# Patient Record
Sex: Female | Born: 1935 | Race: White | Hispanic: No | Marital: Single | Smoking: Never smoker
Health system: Southern US, Community
[De-identification: ages and names within clinical notes are randomized; demographics above are authoritative.]

## PROBLEM LIST (undated history)

## (undated) ENCOUNTER — Emergency Department (HOSPITAL_COMMUNITY): Payer: Commercial Managed Care - PPO

## (undated) DIAGNOSIS — K219 Gastro-esophageal reflux disease without esophagitis: Secondary | ICD-10-CM

## (undated) DIAGNOSIS — G450 Vertebro-basilar artery syndrome: Secondary | ICD-10-CM

## (undated) DIAGNOSIS — Z8739 Personal history of other diseases of the musculoskeletal system and connective tissue: Secondary | ICD-10-CM

## (undated) DIAGNOSIS — K5790 Diverticulosis of intestine, part unspecified, without perforation or abscess without bleeding: Secondary | ICD-10-CM

## (undated) DIAGNOSIS — N200 Calculus of kidney: Secondary | ICD-10-CM

## (undated) DIAGNOSIS — M1990 Unspecified osteoarthritis, unspecified site: Secondary | ICD-10-CM

## (undated) DIAGNOSIS — G4730 Sleep apnea, unspecified: Secondary | ICD-10-CM

## (undated) DIAGNOSIS — F40240 Claustrophobia: Secondary | ICD-10-CM

## (undated) DIAGNOSIS — N952 Postmenopausal atrophic vaginitis: Secondary | ICD-10-CM

## (undated) DIAGNOSIS — I10 Essential (primary) hypertension: Secondary | ICD-10-CM

## (undated) DIAGNOSIS — L719 Rosacea, unspecified: Secondary | ICD-10-CM

## (undated) DIAGNOSIS — E785 Hyperlipidemia, unspecified: Secondary | ICD-10-CM

## (undated) DIAGNOSIS — R7303 Prediabetes: Secondary | ICD-10-CM

## (undated) DIAGNOSIS — K449 Diaphragmatic hernia without obstruction or gangrene: Secondary | ICD-10-CM

## (undated) DIAGNOSIS — H8110 Benign paroxysmal vertigo, unspecified ear: Secondary | ICD-10-CM

## (undated) DIAGNOSIS — M5430 Sciatica, unspecified side: Secondary | ICD-10-CM

## (undated) DIAGNOSIS — H40009 Preglaucoma, unspecified, unspecified eye: Secondary | ICD-10-CM

## (undated) DIAGNOSIS — M4800 Spinal stenosis, site unspecified: Secondary | ICD-10-CM

## (undated) DIAGNOSIS — J45909 Unspecified asthma, uncomplicated: Secondary | ICD-10-CM

## (undated) DIAGNOSIS — G43909 Migraine, unspecified, not intractable, without status migrainosus: Secondary | ICD-10-CM

## (undated) DIAGNOSIS — M8580 Other specified disorders of bone density and structure, unspecified site: Secondary | ICD-10-CM

## (undated) DIAGNOSIS — I7300 Raynaud's syndrome without gangrene: Secondary | ICD-10-CM

## (undated) DIAGNOSIS — N816 Rectocele: Secondary | ICD-10-CM

## (undated) HISTORY — DX: Personal history of other diseases of the musculoskeletal system and connective tissue: Z87.39

## (undated) HISTORY — DX: Sciatica, unspecified side: M54.30

## (undated) HISTORY — DX: Claustrophobia: F40.240

## (undated) HISTORY — DX: Other specified disorders of bone density and structure, unspecified site: M85.80

## (undated) HISTORY — DX: Vertebro-basilar artery syndrome: G45.0

## (undated) HISTORY — DX: Raynaud's syndrome without gangrene: I73.00

## (undated) HISTORY — DX: Diaphragmatic hernia without obstruction or gangrene: K44.9

## (undated) HISTORY — DX: Rectocele: N81.6

## (undated) HISTORY — PX: BLADDER SUSPENSION: SHX72

## (undated) HISTORY — DX: Sleep apnea, unspecified: G47.30

## (undated) HISTORY — DX: Rosacea, unspecified: L71.9

## (undated) HISTORY — PX: OTHER SURGICAL HISTORY: SHX169

## (undated) HISTORY — DX: Gastro-esophageal reflux disease without esophagitis: K21.9

## (undated) HISTORY — PX: ABDOMINAL HYSTERECTOMY: SHX81

## (undated) HISTORY — DX: Benign paroxysmal vertigo, unspecified ear: H81.10

## (undated) HISTORY — DX: Diverticulosis of intestine, part unspecified, without perforation or abscess without bleeding: K57.90

## (undated) HISTORY — DX: Unspecified osteoarthritis, unspecified site: M19.90

## (undated) HISTORY — PX: HERNIA REPAIR: SHX51

## (undated) HISTORY — DX: Preglaucoma, unspecified, unspecified eye: H40.009

## (undated) HISTORY — PX: CARDIAC CATHETERIZATION: SHX172

## (undated) HISTORY — DX: Postmenopausal atrophic vaginitis: N95.2

## (undated) HISTORY — DX: Hyperlipidemia, unspecified: E78.5

## (undated) HISTORY — DX: Migraine, unspecified, not intractable, without status migrainosus: G43.909

## (undated) HISTORY — DX: Calculus of kidney: N20.0

## (undated) HISTORY — DX: Prediabetes: R73.03

## (undated) HISTORY — PX: ANTERIOR (CYSTOCELE) AND POSTERIOR REPAIR (RECTOCELE) WITH XENFORM GRAFT AND SACROSPINOUS FIXATION: SHX6492

## (undated) HISTORY — DX: Spinal stenosis, site unspecified: M48.00

## (undated) HISTORY — DX: Unspecified asthma, uncomplicated: J45.909

---

## 1976-04-12 HISTORY — PX: ABDOMINAL HYSTERECTOMY: SHX81

## 1977-02-10 HISTORY — PX: BREAST BIOPSY: SHX20

## 1987-12-12 HISTORY — PX: BREAST BIOPSY: SHX20

## 1995-04-13 HISTORY — PX: BILATERAL OOPHORECTOMY: SHX1221

## 1997-09-10 HISTORY — PX: OTHER SURGICAL HISTORY: SHX169

## 1998-04-12 HISTORY — PX: CARDIAC CATHETERIZATION: SHX172

## 2011-03-29 DIAGNOSIS — R7303 Prediabetes: Secondary | ICD-10-CM | POA: Insufficient documentation

## 2011-06-30 DIAGNOSIS — H8110 Benign paroxysmal vertigo, unspecified ear: Secondary | ICD-10-CM | POA: Insufficient documentation

## 2011-10-07 DIAGNOSIS — N952 Postmenopausal atrophic vaginitis: Secondary | ICD-10-CM | POA: Insufficient documentation

## 2011-11-04 DIAGNOSIS — N816 Rectocele: Secondary | ICD-10-CM | POA: Insufficient documentation

## 2013-01-10 DIAGNOSIS — N3020 Other chronic cystitis without hematuria: Secondary | ICD-10-CM | POA: Insufficient documentation

## 2013-07-23 DIAGNOSIS — IMO0002 Reserved for concepts with insufficient information to code with codable children: Secondary | ICD-10-CM | POA: Insufficient documentation

## 2013-07-23 DIAGNOSIS — M25559 Pain in unspecified hip: Secondary | ICD-10-CM | POA: Insufficient documentation

## 2014-04-16 DIAGNOSIS — H538 Other visual disturbances: Secondary | ICD-10-CM | POA: Insufficient documentation

## 2014-04-16 DIAGNOSIS — I10 Essential (primary) hypertension: Secondary | ICD-10-CM | POA: Insufficient documentation

## 2015-02-07 DIAGNOSIS — R200 Anesthesia of skin: Secondary | ICD-10-CM | POA: Insufficient documentation

## 2015-06-11 DIAGNOSIS — R079 Chest pain, unspecified: Secondary | ICD-10-CM | POA: Insufficient documentation

## 2015-10-27 DIAGNOSIS — I7300 Raynaud's syndrome without gangrene: Secondary | ICD-10-CM | POA: Insufficient documentation

## 2016-02-16 DIAGNOSIS — R002 Palpitations: Secondary | ICD-10-CM | POA: Insufficient documentation

## 2016-12-22 ENCOUNTER — Other Ambulatory Visit: Payer: Self-pay | Admitting: Family Medicine

## 2016-12-22 DIAGNOSIS — Z780 Asymptomatic menopausal state: Secondary | ICD-10-CM

## 2017-12-20 DIAGNOSIS — G8929 Other chronic pain: Secondary | ICD-10-CM | POA: Insufficient documentation

## 2017-12-20 DIAGNOSIS — M1712 Unilateral primary osteoarthritis, left knee: Secondary | ICD-10-CM | POA: Insufficient documentation

## 2018-02-02 DIAGNOSIS — R2689 Other abnormalities of gait and mobility: Secondary | ICD-10-CM | POA: Insufficient documentation

## 2018-02-02 DIAGNOSIS — R200 Anesthesia of skin: Secondary | ICD-10-CM | POA: Insufficient documentation

## 2018-02-02 DIAGNOSIS — R42 Dizziness and giddiness: Secondary | ICD-10-CM | POA: Insufficient documentation

## 2018-05-11 ENCOUNTER — Other Ambulatory Visit: Payer: Self-pay | Admitting: Pediatrics

## 2018-05-11 DIAGNOSIS — Z1231 Encounter for screening mammogram for malignant neoplasm of breast: Secondary | ICD-10-CM

## 2018-05-25 ENCOUNTER — Ambulatory Visit
Admission: RE | Admit: 2018-05-25 | Discharge: 2018-05-25 | Disposition: A | Discharge status: Home / Self Care | Payer: Medicare Other | Source: Ambulatory Visit | Attending: Pediatrics | Admitting: Pediatrics

## 2018-05-25 ENCOUNTER — Encounter (INDEPENDENT_AMBULATORY_CARE_PROVIDER_SITE_OTHER): Payer: Self-pay

## 2018-05-25 DIAGNOSIS — Z1231 Encounter for screening mammogram for malignant neoplasm of breast: Secondary | ICD-10-CM

## 2018-10-02 ENCOUNTER — Encounter: Payer: Self-pay | Admitting: Emergency Medicine

## 2018-10-02 ENCOUNTER — Other Ambulatory Visit: Payer: Self-pay

## 2018-10-02 ENCOUNTER — Ambulatory Visit
Admission: EM | Admit: 2018-10-02 | Discharge: 2018-10-02 | Disposition: A | Discharge status: Home / Self Care | Payer: Medicare Other

## 2018-10-02 DIAGNOSIS — R030 Elevated blood-pressure reading, without diagnosis of hypertension: Secondary | ICD-10-CM

## 2018-10-02 DIAGNOSIS — H1131 Conjunctival hemorrhage, right eye: Secondary | ICD-10-CM | POA: Diagnosis not present

## 2018-10-02 HISTORY — DX: Essential (primary) hypertension: I10

## 2018-10-02 NOTE — ED Triage Notes (Signed)
Pt c/o redness in her right eye. She states it was blood shot. Started this morning.  Her BP was also elevated at home 172/64. She also had a headache. She denies any usual weakness. She is being treated for a UTI and with a different antibiotic than she usually takes.

## 2018-10-02 NOTE — ED Provider Notes (Signed)
[facility]    CSN: [id number] Arrival date & time: 10/02/18  1527      History   Chief Complaint Chief Complaint  Patient presents with  . Eye Problem    right    HPI Heidi Dorsey is a 85 y.o. female.   Heidi Dorsey presents with multiple complaints. She noted this morning when she woke that her right eye was "blood shot."  No injury. States over the past few days she has noted her BP has been elevated, in the 170's, which is not typical for her. She has had some intermittent headache's, last was yesterday afternoon. No chest pain, shortness of breath or weakness. She takes metoprolol for htn, hasn't taken it yet today. Has been prescribed losartan in the past but states she has not been taking as her BP is typically well controlled. At her last visit with her PCP it was recommended that she restart as her BP was 166 systolic. This was on 6/18. She was seen for UTI, started on keflex. Yesterday culture resulted and was found to be resistent to keflex, therefore she was switched to macrobid. She has taken two doses of this. She feels like she still has UTI symptoms. States she has macrobid listed as an intolerance but can't remember what type of response she has had to it in the past. She questions if there is another medication she can take. States she is also concerned about Covid-19 in her living situation, retirement community. She has a chronic cough which is unchanged for her. No specific ill contacts that she is aware of.    ROS per HPI, negative if not otherwise mentioned.      Past Medical History:  Diagnosis Date  . Hypertension     There are no active problems to display for this patient.   Past Surgical History:  Procedure Laterality Date  . ABDOMINAL HYSTERECTOMY    . ANTERIOR (CYSTOCELE) AND POSTERIOR REPAIR (RECTOCELE) WITH XENFORM GRAFT AND SACROSPINOUS FIXATION    . BLADDER SUSPENSION    . BREAST BIOPSY Left 12/1987   neg  . BREAST BIOPSY  Right 02/1977   neg papilloma  . CARDIAC CATHETERIZATION    . HERNIA REPAIR      OB History   No obstetric history on file.      Home Medications    Prior to Admission medications   Medication Sig Start Date End Date Taking? Authorizing Provider  Omega-3 1000 MG CAPS Take by mouth. 08/25/09  Yes [provider]  Ascorbic Acid 500 MG CAPS Take by mouth.    [provider]  BIOTIN PO Take by mouth.    [provider]  metoprolol succinate (TOPROL-XL) 25 MG 24 hr tablet  06/20/18   [provider]  Multiple Vitamin (MULTI-VITAMIN) tablet Take by mouth.    [provider]  nitrofurantoin, macrocrystal-monohydrate, (MACROBID) 100 MG capsule  10/01/18   [provider]    Family History Family History  Problem Relation Age of Onset  . Breast cancer Neg Hx     Social History Social History   Tobacco Use  . Smoking status: Never Smoker  . Smokeless tobacco: Never Used  Substance Use Topics  . Alcohol use: Not Currently  . Drug use: Not Currently     Allergies   Calcitonin (salmon), Estradiol, Fexofenadine, Nsaids, Diphenhydramine, Clarithromycin, Codeine, Epinephrine, Erythromycin, Fluticasone, Fluticasone propionate, Ipratropium bromide, Lisinopril, Other, Sulfa antibiotics, Tetracycline, Amoxicillin-pot clavulanate, Ciprofloxacin, Latex, and Nitrofurantoin   Review  of Systems Review of Systems   Physical Exam Triage Vital Signs ED Triage Vitals  Enc Vitals Group     BP 10/02/18 1636 (!) 177/66     Pulse Rate 10/02/18 1636 (!) 55     Resp 10/02/18 1636 16     Temp 10/02/18 1636 98.3 F (36.8 C)     Temp Source 10/02/18 1636 Oral     SpO2 10/02/18 1636 99 %     Weight 10/02/18 1625 142 lb (64.4 kg)     Height 10/02/18 1625 4\' 11"  (1.499 m)     Head Circumference --      Peak Flow --      Pain Score 10/02/18 1625 0     Pain Loc --      Pain Edu? --      Excl. in GC? --    No data found.  Updated Vital  Signs BP (!) 177/66 (BP Location: Right Arm)   Pulse (!) 55   Temp 98.3 F (36.8 C) (Oral)   Resp 16   Ht 4\' 11"  (1.499 m)   Wt 142 lb (64.4 kg)   SpO2 99%   BMI 28.68 kg/m   Visual Acuity Right Eye Distance: 20/40(corrected) Left Eye Distance: 20/30(corrected) Bilateral Distance: 20/30(corrected)  Right Eye Near:   Left Eye Near:    Bilateral Near:     Physical Exam Constitutional:      General: She is not in acute distress.    Appearance: She is well-developed.  Eyes:     Conjunctiva/sclera:     Right eye: Hemorrhage present.  Cardiovascular:     Rate and Rhythm: Regular rhythm. Bradycardia present.     Heart sounds: Normal heart sounds.  Pulmonary:     Effort: Pulmonary effort is normal.     Breath sounds: Normal breath sounds.     Comments: Occasional congested cough noted  Skin:    General: Skin is warm and dry.  Neurological:     Mental Status: She is alert and oriented to person, place, and time.      UC Treatments / Results  Labs (all labs ordered are listed, but only abnormal results are displayed) Labs Reviewed - No data to display  EKG None  Radiology No results found.  Procedures Procedures (including critical care time)  Medications Ordered in UC Medications - No data to display  Initial Impression / Assessment and Plan / UC Course  I have reviewed the triage vital signs and the nursing notes.  Pertinent labs & imaging results that were available during my care of the patient were reviewed by me and considered in my medical decision making (see chart for details).     Reviewed chart with patient and discussed results and concerns at length. BP remains elevated today, she endorses not feeling well related to UTI and has some apparent anxiety related to antibiotics, as well as her risk of Covid-19 in her living facility. Offered testing today as she does have a cough, she declines. Recommended starting losartan as previously recommended.  Reviewed culture results and discussed that it does appear that macrobid is the best option at this time for her UTI treatment. Patient does seem unsatisfied with plan and discussion, asking if there is a physician available in the clinic. Return precautions provided. No red flag findings here today. Ambulatory out of clinic without difficulty.   Final Clinical Impressions(s) / UC Diagnoses   Final diagnoses:  Subconjunctival hemorrhage of right eye  Elevated  blood pressure reading     Discharge Instructions     I would continue taking the Nitrofurantoin for your UTI.  It appears appropriate to use your Losartan for blood pressure management.  Please follow up with your primary care provider as scheduled next week for recheck and BP recheck.  Don't hesitate to return for any worsening or persistent symptoms.    ED Prescriptions    None     Controlled Substance Prescriptions [place] Controlled Substance Registry consulted? Not Applicable   Zigmund Gottron, NP 10/02/18 2126

## 2018-10-02 NOTE — Discharge Instructions (Signed)
I would continue taking the Nitrofurantoin for your UTI.  It appears appropriate to use your Losartan for blood pressure management.  Please follow up with your primary care provider as scheduled next week for recheck and BP recheck.  Don't hesitate to return for any worsening or persistent symptoms.

## 2019-04-18 ENCOUNTER — Other Ambulatory Visit: Payer: Self-pay | Admitting: Pediatrics

## 2019-04-18 DIAGNOSIS — Z1231 Encounter for screening mammogram for malignant neoplasm of breast: Secondary | ICD-10-CM

## 2019-05-28 ENCOUNTER — Other Ambulatory Visit: Payer: Self-pay

## 2019-05-28 ENCOUNTER — Ambulatory Visit
Admission: RE | Admit: 2019-05-28 | Discharge: 2019-05-28 | Disposition: A | Discharge status: Home / Self Care | Payer: Medicare Other | Source: Ambulatory Visit | Attending: Pediatrics | Admitting: Pediatrics

## 2019-05-28 ENCOUNTER — Encounter (INDEPENDENT_AMBULATORY_CARE_PROVIDER_SITE_OTHER): Payer: Self-pay

## 2019-05-28 DIAGNOSIS — Z1231 Encounter for screening mammogram for malignant neoplasm of breast: Secondary | ICD-10-CM | POA: Insufficient documentation

## 2019-08-23 DIAGNOSIS — N1831 Chronic kidney disease, stage 3a: Secondary | ICD-10-CM | POA: Insufficient documentation

## 2019-09-04 ENCOUNTER — Ambulatory Visit: Payer: Self-pay | Admitting: Internal Medicine

## 2019-12-11 ENCOUNTER — Ambulatory Visit: Payer: Self-pay | Admitting: Family Medicine

## 2019-12-18 ENCOUNTER — Other Ambulatory Visit: Payer: Self-pay

## 2019-12-18 ENCOUNTER — Encounter: Payer: Self-pay | Admitting: Nurse Practitioner

## 2019-12-18 ENCOUNTER — Ambulatory Visit (INDEPENDENT_AMBULATORY_CARE_PROVIDER_SITE_OTHER): Payer: Medicare Other | Admitting: Nurse Practitioner

## 2019-12-18 VITALS — BP 139/60 | HR 62 | Temp 98.2°F | Ht 59.0 in | Wt 131.0 lb

## 2019-12-18 DIAGNOSIS — F5101 Primary insomnia: Secondary | ICD-10-CM | POA: Diagnosis not present

## 2019-12-18 DIAGNOSIS — M79609 Pain in unspecified limb: Secondary | ICD-10-CM

## 2019-12-18 DIAGNOSIS — R202 Paresthesia of skin: Secondary | ICD-10-CM

## 2019-12-18 MED ORDER — DOXEPIN HCL 10 MG PO CAPS: 10.0000 mg | ORAL_CAPSULE | Freq: Every evening | ORAL | 1 refills | Status: DC | PRN

## 2019-12-18 NOTE — Assessment & Plan Note (Addendum)
Patient is a 86 year old female who presents to clinic for primary insomnia. Patient reports experiencing insomnia in the last year with symptoms gradually getting worse. Patient reports increased daytime somnolence but nighttime sleeplessness. Patient has tried melatonin over-the-counter up to 25 mg with no therapeutic effect. Provided education to patient with printed handouts given. Advised patient to sleep with the lights on, electronics and avoid drinking caffeinated drinks before bedtime. Started patient on doxepin 10 mg at bedtime as needed. Follow-up as needed with worsening or unresolved symptoms.  Rx sent to pharmacy.

## 2019-12-18 NOTE — Assessment & Plan Note (Signed)
Patient is a 86 year old female who presents to clinic today for paresthesia and pain of left upper and lower extremity. Patient is reporting symptoms has been present for a few years. Patient is reporting worsening symptoms with nothing to alleviate problems. Provided education with printed handouts given Labs collected today B12, CMP, CBC. Referral to neurology completed.  Follow-up with worsening or unresolved symptoms.

## 2019-12-18 NOTE — Patient Instructions (Signed)

## 2019-12-18 NOTE — Progress Notes (Signed)
New Patient Office Visit  Subjective:  Patient ID: Heidi Dorsey, female    DOB: [birth year]  Age: 86 y.o. MRN: [id number]  CC:  Chief Complaint  Patient presents with  . Medical Management of Chronic Issues    NP to establish care    HPI Heidi Dorsey presents for establishing  Encounter for general adult medical examination  Physical Patient's last physical exam was 1 year ago/establishing care Weight: Appropriate for height (BMI less than 27%) ; 26.46 kg/M Blood Pressure: Normal (BP less than 120/80) ; 139/60 Medical History: Patient history reviewed ; Family history reviewed ;  Allergies Reviewed: No change in current allergies ;  Medications Reviewed: Medications reviewed - no changes ;  Lipids: Normal lipid levels ;  Smoking: Life-long non-smoker ;  Physical Activity: Exercises at least few days a week Alcohol/Drug Use: Is a non-drinker ; No illicit drug use ;  Patient is not afflicted from Stress Incontinence and Urge Incontinence  Safety: reviewed ; Patient wears a seat belt, has smoke detectors, has carbon monoxide detectors, practices appropriate gun safety, and wears sunscreen with extended sun exposure. Dental Care: biannual cleanings, brushes and flosses daily. Ophthalmology/Optometry: Annual visit.  Hearing loss: none Vision impairments: Wears glasses   Past Medical History:  Diagnosis Date  . Asthma   . Claustrophobia   . GERD (gastroesophageal reflux disease)   . H/O degenerative disc disease   . Hiatal hernia   . Hypertension   . Kidney stones   . Migraines   . Osteoarthritis    Caused feet deformity  . Raynauds syndrome   . Sciatica   . Spinal stenosis   . Vertebrobasilar artery syndrome     Past Surgical History:  Procedure Laterality Date  . ABDOMINAL HYSTERECTOMY    . ANTERIOR (CYSTOCELE) AND POSTERIOR REPAIR (RECTOCELE) WITH XENFORM GRAFT AND SACROSPINOUS FIXATION    . BLADDER SUSPENSION    . BREAST BIOPSY Left 12/1987   neg  . BREAST  BIOPSY Right 02/1977   neg papilloma  . CARDIAC CATHETERIZATION    . HERNIA REPAIR      Family History  Problem Relation Age of Onset  . Brain cancer Father   . Bladder Cancer Brother   . Breast cancer Neg Hx     Social History   Socioeconomic History  . Marital status: Single    Spouse name: Not on file  . Number of children: Not on file  . Years of education: Not on file  . Highest education level: Not on file  Occupational History  . Not on file  Tobacco Use  . Smoking status: Never Smoker  . Smokeless tobacco: Never Used  Vaping Use  . Vaping Use: Never used  Substance and Sexual Activity  . Alcohol use: Yes    Alcohol/week: 2.0 standard drinks    Types: 2 Glasses of wine per week    Comment: per week  . Drug use: Not Currently  . Sexual activity: Not Currently    Partners: Male    Birth control/protection: Surgical  Other Topics Concern  . Not on file  Social History Narrative  . Not on file      ROS Review of Systems  Neurological: Positive for weakness.       Left side upper/lower extremities  All other systems reviewed and are negative.   Objective:   Today's Vitals: BP 139/60   Pulse 62   Temp 98.2 F (36.8 C)   Ht 4\' 11"  (1.499 m)  Wt 131 lb (59.4 kg)   SpO2 98%   BMI 26.46 kg/m   Physical Exam Vitals reviewed.  Constitutional:      Appearance: Normal appearance.  HENT:     Head: Normocephalic.     Right Ear: External ear normal. There is no impacted cerumen.     Left Ear: External ear normal. There is no impacted cerumen.     Nose: Nose normal.     Mouth/Throat:     Pharynx: Oropharynx is clear.  Eyes:     Conjunctiva/sclera: Conjunctivae normal.  Cardiovascular:     Rate and Rhythm: Normal rate and regular rhythm.     Pulses: Normal pulses.     Heart sounds: Normal heart sounds.  Pulmonary:     Effort: Pulmonary effort is normal.     Breath sounds: Normal breath sounds.  Abdominal:     General: Bowel sounds are normal.    Musculoskeletal:     Cervical back: Normal range of motion.  Skin:    General: Skin is warm.  Neurological:     Mental Status: She is alert and oriented to person, place, and time.     Motor: Weakness present.     Comments: Left sided paresthesia upper and lower extremities   Psychiatric:        Mood and Affect: Mood normal.        Behavior: Behavior normal.     Assessment & Plan:  Primary insomnia Patient is a 86 year old female who presents to clinic for primary insomnia. Patient reports experiencing insomnia in the last year with symptoms gradually getting worse. Patient reports increased daytime somnolence but nighttime sleeplessness. Patient has tried melatonin over-the-counter up to 25 mg with no therapeutic effect. Provided education to patient with printed handouts given. Advised patient to sleep with the lights on, electronics and avoid drinking caffeinated drinks before bedtime. Started patient on doxepin 10 mg at bedtime as needed. Follow-up as needed with worsening or unresolved symptoms.  Rx sent to pharmacy.  Paresthesia and pain of left extremity Patient is a 86 year old female who presents to clinic today for paresthesia and pain of left upper and lower extremity. Patient is reporting symptoms has been present for a few years. Patient is reporting worsening symptoms with nothing to alleviate problems. Provided education with printed handouts given Labs collected today B12, CMP, CBC. Referral to neurology completed.  Follow-up with worsening or unresolved symptoms.  Problem List Items Addressed This Visit      Other   Primary insomnia   Paresthesia and pain of left extremity - Primary      Outpatient Encounter Medications as of 12/18/2019  Medication Sig  . Ascorbic Acid 500 MG CAPS Take by mouth.  . calcium carbonate (TUMS - DOSED IN MG ELEMENTAL CALCIUM) 500 MG chewable tablet Chew 1 tablet by mouth as needed for indigestion or heartburn.  . Multiple  Vitamin (MULTI-VITAMIN) tablet Take by mouth.  . Omega-3 1000 MG CAPS Take by mouth.  . [DISCONTINUED] BIOTIN PO Take by mouth.  . [DISCONTINUED] metoprolol succinate (TOPROL-XL) 25 MG 24 hr tablet   . [DISCONTINUED] nitrofurantoin, macrocrystal-monohydrate, (MACROBID) 100 MG capsule    No facility-administered encounter medications on file as of 12/18/2019.    Follow-up: No follow-ups on file.   Daryll Drown, NP

## 2019-12-19 LAB — CBC WITH DIFFERENTIAL/PLATELET
Basophils Absolute: 0 10*3/uL (ref 0.0–0.2)
Basos: 1 %
EOS (ABSOLUTE): 0 10*3/uL (ref 0.0–0.4)
Eos: 1 %
Hematocrit: 41.3 % (ref 34.0–46.6)
Hemoglobin: 13.9 g/dL (ref 11.1–15.9)
Immature Grans (Abs): 0 10*3/uL (ref 0.0–0.1)
Immature Granulocytes: 0 %
Lymphocytes Absolute: 1.4 10*3/uL (ref 0.7–3.1)
Lymphs: 22 %
MCH: 30 pg (ref 26.6–33.0)
MCHC: 33.7 g/dL (ref 31.5–35.7)
MCV: 89 fL (ref 79–97)
Monocytes Absolute: 0.6 10*3/uL (ref 0.1–0.9)
Monocytes: 9 %
Neutrophils Absolute: 4.2 10*3/uL (ref 1.4–7.0)
Neutrophils: 67 %
Platelets: 171 10*3/uL (ref 150–450)
RBC: 4.63 x10E6/uL (ref 3.77–5.28)
RDW: 14 % (ref 11.7–15.4)
WBC: 6.2 10*3/uL (ref 3.4–10.8)

## 2019-12-19 LAB — COMPREHENSIVE METABOLIC PANEL
ALT: 12 IU/L (ref 0–32)
AST: 17 IU/L (ref 0–40)
Albumin/Globulin Ratio: 2.1 (ref 1.2–2.2)
Albumin: 4.4 g/dL (ref 3.6–4.6)
Alkaline Phosphatase: 71 IU/L (ref 48–121)
BUN/Creatinine Ratio: 22 (ref 12–28)
BUN: 16 mg/dL (ref 8–27)
Bilirubin Total: 0.3 mg/dL (ref 0.0–1.2)
CO2: 27 mmol/L (ref 20–29)
Calcium: 9.4 mg/dL (ref 8.7–10.3)
Chloride: 101 mmol/L (ref 96–106)
Creatinine, Ser: 0.74 mg/dL (ref 0.57–1.00)
GFR calc Af Amer: 85 mL/min/{1.73_m2} (ref >59)
GFR calc non Af Amer: 74 mL/min/{1.73_m2} (ref >59)
Globulin, Total: 2.1 g/dL (ref 1.5–4.5)
Glucose: 101 mg/dL — ABNORMAL HIGH (ref 65–99)
Potassium: 4.7 mmol/L (ref 3.5–5.2)
Sodium: 138 mmol/L (ref 134–144)
Total Protein: 6.5 g/dL (ref 6.0–8.5)

## 2019-12-19 LAB — VITAMIN B12: Vitamin B-12: 1753 pg/mL — ABNORMAL HIGH (ref 232–1245)

## 2019-12-24 ENCOUNTER — Encounter: Payer: Self-pay | Admitting: Family Medicine

## 2020-01-01 ENCOUNTER — Telehealth: Payer: Self-pay | Admitting: Family Medicine

## 2020-01-01 NOTE — Telephone Encounter (Signed)
Pt called stating that we have her dates listed incorrectly. Pt received her first dose of Pfizer on 05/16/19 and second dose on 06/06/19.

## 2020-01-01 NOTE — Telephone Encounter (Signed)
Corrected

## 2020-01-02 ENCOUNTER — Ambulatory Visit: Payer: Medicare Other

## 2020-01-04 ENCOUNTER — Other Ambulatory Visit: Payer: Self-pay

## 2020-01-04 ENCOUNTER — Ambulatory Visit (INDEPENDENT_AMBULATORY_CARE_PROVIDER_SITE_OTHER): Payer: Medicare Other

## 2020-01-04 DIAGNOSIS — Z23 Encounter for immunization: Secondary | ICD-10-CM | POA: Diagnosis not present

## 2020-01-09 ENCOUNTER — Ambulatory Visit: Payer: Medicare Other

## 2020-01-11 ENCOUNTER — Encounter: Payer: Self-pay | Admitting: Neurology

## 2020-01-16 ENCOUNTER — Other Ambulatory Visit: Payer: Self-pay

## 2020-01-16 ENCOUNTER — Ambulatory Visit (INDEPENDENT_AMBULATORY_CARE_PROVIDER_SITE_OTHER): Payer: Medicare Other

## 2020-01-16 DIAGNOSIS — Z23 Encounter for immunization: Secondary | ICD-10-CM

## 2020-01-16 NOTE — Progress Notes (Signed)
   Covid-19 Vaccination Clinic  Name:  Heidi Dorsey    MRN: [id number] DOB: May 09, 1930  01/16/2020  [name] was observed post Covid-19 immunization for 30 minutes based on pre-vaccination screening without incident. She was provided with Vaccine Information Sheet and instruction to access the V-Safe system.   [name] was instructed to call 911 with any severe reactions post vaccine: Marland Kitchen Difficulty breathing  . Swelling of face and throat  . A fast heartbeat  . A bad rash all over body  . Dizziness and weakness

## 2020-01-25 ENCOUNTER — Telehealth: Payer: Self-pay

## 2020-01-25 NOTE — Telephone Encounter (Signed)
Pt called requesting to switch providers. Said that she is getting older and needs to see an actual MD. Jamal Maes explaining to pt that we dont have any MDs currently that are taking anymore patients because their patient lists are full. Pt requested that I put in a message about it anyway.

## 2020-01-26 NOTE — Telephone Encounter (Signed)
I have never seen patient.

## 2020-01-27 ENCOUNTER — Other Ambulatory Visit: Payer: Self-pay | Admitting: Family Medicine

## 2020-01-27 NOTE — Telephone Encounter (Signed)
No new patients at this time. Thanks, though.

## 2020-01-28 NOTE — Telephone Encounter (Signed)
Patient aware and verbalizes understanding. 

## 2020-02-29 ENCOUNTER — Ambulatory Visit (INDEPENDENT_AMBULATORY_CARE_PROVIDER_SITE_OTHER): Payer: Medicare Other | Admitting: Neurology

## 2020-02-29 ENCOUNTER — Other Ambulatory Visit: Payer: Self-pay

## 2020-02-29 ENCOUNTER — Encounter: Payer: Self-pay | Admitting: Neurology

## 2020-02-29 VITALS — BP 168/77 | HR 81 | Ht <= 58 in | Wt 132.0 lb

## 2020-02-29 DIAGNOSIS — R202 Paresthesia of skin: Secondary | ICD-10-CM

## 2020-02-29 DIAGNOSIS — M48062 Spinal stenosis, lumbar region with neurogenic claudication: Secondary | ICD-10-CM | POA: Diagnosis not present

## 2020-02-29 DIAGNOSIS — R200 Anesthesia of skin: Secondary | ICD-10-CM

## 2020-02-29 NOTE — Patient Instructions (Addendum)
Start home physical therapy   Start using a rollator  You may try using a wrist brace for the left hand  Return to clinic as needed

## 2020-02-29 NOTE — Progress Notes (Signed)
[facility] Neurology Division Clinic Note - Initial Visit   Date: 02/29/20  Heidi Dorsey MRN: [id number] DOB: Mar 16, 1931   Dear Heidi Barnacle, NP:  Thank you for your kind referral of Heidi Dorsey for consultation of left sided numbness. Although her history is well known to you, please allow [place] to reiterate it for the purpose of our medical record. The patient was accompanied to the clinic by self.  History of Present Illness: Heidi Dorsey is a 86 y.o. right-handed female with cervical and lumbar spinal stenosis presenting for evaluation of left hand and leg numbness.  For the past several years, she has had issues with left leg numbness, attributed to severe lumbar spinal stenosis, worse at L4-5.  With her age, she had decided not to consider surgical options.  Over the past year, she feels that the numbness in the left leg has become more pronounced and her walking is unsteady.  She does not use a cane and has not suffered any falls, but is very careful.   Also over the past year, she has numbness in the left hand. Symptoms are constant .  She has difficulty with fine motor tasks such as buttoning, sewing, opening jars/bottles, or manipulating her gears when driving. She did not drive to her appointment and was brought by her son. She previously worked as a Occupational hygienist.   She lives alone in a multilevel apartment complex.    She had several images on CD that I reviewed, as well as prior notes with her neurologists in the past under CareEverywhere.   Out-side paper records, electronic medical record, and images have been reviewed where available and summarized as:  CTA head and neck 01/02/2018:  No acute vascular occlusion or hemodynamically significant stenosis.   CT head 01/02/2018:  No acute intracranial abnormalities.  01/25/17 MR LUMBAR SPINE WO CONTRAST IMPRESSION: Lumbar levoscoliosis.  Moderate to severe degenerative changes of the lumbar spine with:  1. Grade 1  anterolisthesis of L4 on L5.  2. Spinal canal stenosis, moderate at L2-L3, moderate L3-L4, severe at L4-L5 and moderate at L5-S1.  3. Neural foraminal stenosis, moderate on the right and mild on the left at L2-L3, mild bilaterally at L3-L4, mild on the right and moderate on the left at L4-L5, moderate bilaterally at L5-S1. Potential cholelithiasis. Recommend right upper quadrant ultrasound for further characterization.    No results found for: HGBA1C Lab Results  Component Value Date   VITAMINB12 1,753 (H) 12/18/2019   No results found for: TSH No results found for: ESRSEDRATE, POCTSEDRATE  Past Medical History:  Diagnosis Date  . Asthma   . BPPV (benign paroxysmal positional vertigo)   . Claustrophobia   . Diverticulosis   . GERD (gastroesophageal reflux disease)   . H/O degenerative disc disease   . Hiatal hernia   . Hyperlipidemia   . Hypertension   . Kidney stones   . Migraines   . Osteoarthritis    Caused feet deformity  . Osteopenia   . Prediabetes   . Preglaucoma   . Raynauds syndrome   . Rectocele   . Rosacea   . Sciatica   . Sleep apnea   . Spinal stenosis   . Vaginal atrophy   . Vertebrobasilar artery syndrome     Past Surgical History:  Procedure Laterality Date  . ABDOMINAL HYSTERECTOMY  1978  . ANTERIOR (CYSTOCELE) AND POSTERIOR REPAIR (RECTOCELE) WITH XENFORM GRAFT AND SACROSPINOUS FIXATION    . BILATERAL OOPHORECTOMY  1997  . bladder stones  kidney colic/ procedure performed in 1973 at [place]  . BLADDER SUSPENSION    . BREAST BIOPSY Left 12/1987   neg  . BREAST BIOPSY Right 02/1977   neg papilloma  . CARDIAC CATHETERIZATION  2000  . HERNIA REPAIR    . plantar fibroma  Left 09/1997   Excised     Medications:  Outpatient Encounter Medications as of 02/29/2020  Medication Sig  . Ascorbic Acid 500 MG CAPS Take by mouth.  . calcium carbonate (TUMS - DOSED IN MG ELEMENTAL CALCIUM) 500 MG chewable tablet Chew 1 tablet by mouth as needed for  indigestion or heartburn.  . doxepin (SINEQUAN) 10 MG capsule Take 1 capsule (10 mg total) by mouth at bedtime as needed.  . Multiple Vitamin (MULTI-VITAMIN) tablet Take by mouth.  . Omega-3 1000 MG CAPS Take by mouth.   No facility-administered encounter medications on file as of 02/29/2020.    Allergies:  Allergies  Allergen Reactions  . Calcitonin ([place]) Other (See Comments)    LEFT BUNDLE BRANCH BLOCK  . Estradiol Other (See Comments)    Headache  . Fexofenadine Other (See Comments)    Syncope  . Nsaids Other (See Comments)    Stomach ulcer after taking for 3 days in 2009. Negative H. Pylori test.  . Diphenhydramine Other (See Comments)    Syncope  . Clarithromycin Other (See Comments)    CAN TAKE AZITHROMYCIN  . Codeine Other (See Comments)  . Doxepin Hcl Other (See Comments)    Heart arrhythmia  . Epinephrine Other (See Comments)  . Erythromycin Other (See Comments)    Stomach irritation  . Fluticasone Other (See Comments)  . Fluticasone Propionate Other (See Comments)    And similar corticoid steroids  . Ipratropium Bromide Other (See Comments)    Syncope  . Lisinopril Other (See Comments)    Other Reaction: BRUISING, CANKERS & ARTHRALGIAS  . Other Other (See Comments)    Paraphenylendiamine/propanol amine/anti-cholinergic compounds  . Sulfa Antibiotics Other (See Comments)  . Tetracycline Other (See Comments)    Mouth sores after just 2 doses. DOXYCYCLINE is OK per patient  . Amoxicillin-Pot Clavulanate Rash    Able to take plain amoxicillin  . Ciprofloxacin Rash  . Latex Rash  . Nitrofurantoin Other (See Comments)    Multiple mild symptoms - would prefer not to take again    Family History: Family History  Problem Relation Age of Onset  . Osteoporosis Mother   . Ovarian cancer Mother   . Uterine cancer Mother   . Brain cancer Father   . Bladder Cancer Brother   . Rectal cancer Brother   . Skin cancer Brother   . Osteoporosis Sister   . Diabetes  Paternal Grandmother   . Breast cancer Neg Hx     Social History: Social History   Tobacco Use  . Smoking status: Never Smoker  . Smokeless tobacco: Never Used  Vaping Use  . Vaping Use: Never used  Substance Use Topics  . Alcohol use: Yes    Alcohol/week: 2.0 standard drinks    Types: 2 Glasses of wine per week    Comment: per week  . Drug use: Not Currently   Social History   Social History Narrative   Right Handed   Lives in an apartment complex and her apartment is on ground level.    Drinks Half Decaf Coffee and Tea     Vital Signs:  BP (!) 168/77   Pulse 81   Ht 4\' 10"  (  1.473 m)   Wt 132 lb (59.9 kg)   SpO2 96%   BMI 27.59 kg/m   Neurological Exam: MENTAL STATUS including orientation to time, place, person, recent and remote memory, attention span and concentration, language, and fund of knowledge is normal.  Speech is not dysarthric. Frail appearing  CRANIAL NERVES: II:  No visual field defects.  Unremarkable fundi.   III-IV-VI: Pupils equal round and reactive to light.  Normal conjugate, extra-ocular eye movements in all directions of gaze.  No nystagmus.  No ptosis.   V:  Normal facial sensation.    VII:  Normal facial symmetry and movements.   VIII:  Normal hearing and vestibular function.   IX-X:  Normal palatal movement.   XI:  Normal shoulder shrug and head rotation.   XII:  Normal tongue strength and range of motion, no deviation or fasciculation.  MOTOR:  Generalized loss of muscle bulk throughout, especially hands and feet.  No fasciculations or abnormal movements.  No pronator drift.   Upper Extremity:  Right  Left  Deltoid  5/5   5/5   Biceps  5/5   5/5   Triceps  5/5   5/5   Infraspinatus 5/5  5/5  Medial pectoralis 5/5  5/5  Wrist extensors  5/5   5/5   Wrist flexors  5/5   5/5   Finger extensors  5/5   5/5   Finger flexors  5/5   5/5   Dorsal interossei  5/5   5/5   Abductor pollicis  5-/5   4/5   Tone (Ashworth scale)  0  0    Lower Extremity:  Right  Left  Hip flexors  5/5   5/5   Hip extensors  5/5   5/5   Adductor 5/5  5/5  Abductor 5/5  5/5  Knee flexors  5/5   5/5   Knee extensors  5/5   5/5   Dorsiflexors  5/5   5/5   Plantarflexors  5/5   5/5   Toe extensors  5/5   5/5   Toe flexors  5/5   5/5   Tone (Ashworth scale)  0  0   MSRs:  Right        Left                  brachioradialis 2+  2+  biceps 2+  2+  triceps 2+  2+  patellar 3+  3+  ankle jerk 2+  2+  Hoffman no  no  plantar response mute  mute   SENSORY: Reduced temperature and pin prick over the left median distribution, otherwise sensation intact.  Normal and symmetric perception of light touch, pinprick, vibration, and proprioception.  COORDINATION/GAIT: Normal finger-to- nose-finger. Intact rapid alternating movements bilaterally. Gait is wide-based, unsteady, unassisted.    IMPRESSION: 1. Left hand paresthesias, likely due to carpal tunnel syndrome  - Discussed role of NCS/EMG to guide management, but given that she would not want surgery, it was decided to proceed with conservative therapies  - Start using a wrist brace  2.  Left leg numbness and gait instability due to known severe lumbar spinal canal stenosis with neurogenic claudication.  She does not wish to have surgery, so again we will continue supportive care.  MRI lumbar spine was personally viewed which showed multilevel degenerative changes, severe canal stenosis at L4-5.  - Start home physical therapy  - Urged patient to use a rollator at all times  -  Fall precautions discussed  3.  I do no appreciate any history or imaging findings to suggest vertobrobasilar insufficiency.  [facility] head from 2019 [facility] shows patent vessels.   Return to clinic as needed  Total time spent reviewing records, interview, history/exam, counseling, documentation, and coordination of care on day of encounter:  60 min  Thank you for allowing me to participate in patient's care.  If I can  answer any additional questions, I would be pleased to do so.    Sincerely,    Bush Murdoch K. Allena Katz, DO

## 2020-05-01 ENCOUNTER — Ambulatory Visit: Payer: Medicare Other | Admitting: Family Medicine

## 2020-05-02 ENCOUNTER — Ambulatory Visit: Payer: Medicare Other | Admitting: Nurse Practitioner

## 2020-05-05 ENCOUNTER — Ambulatory Visit: Payer: Medicare Other | Admitting: Family Medicine

## 2020-05-14 ENCOUNTER — Other Ambulatory Visit: Payer: Self-pay

## 2020-05-14 ENCOUNTER — Encounter: Payer: Self-pay | Admitting: Family Medicine

## 2020-05-14 ENCOUNTER — Ambulatory Visit (INDEPENDENT_AMBULATORY_CARE_PROVIDER_SITE_OTHER): Payer: Medicare Other | Admitting: Family Medicine

## 2020-05-14 VITALS — BP 159/68 | HR 66 | Temp 97.4°F | Ht <= 58 in | Wt 133.4 lb

## 2020-05-14 DIAGNOSIS — I10 Essential (primary) hypertension: Secondary | ICD-10-CM | POA: Diagnosis not present

## 2020-05-14 DIAGNOSIS — K219 Gastro-esophageal reflux disease without esophagitis: Secondary | ICD-10-CM | POA: Insufficient documentation

## 2020-05-14 DIAGNOSIS — R002 Palpitations: Secondary | ICD-10-CM

## 2020-05-14 DIAGNOSIS — J309 Allergic rhinitis, unspecified: Secondary | ICD-10-CM | POA: Insufficient documentation

## 2020-05-14 DIAGNOSIS — E7800 Pure hypercholesterolemia, unspecified: Secondary | ICD-10-CM | POA: Diagnosis not present

## 2020-05-14 DIAGNOSIS — R351 Nocturia: Secondary | ICD-10-CM

## 2020-05-14 DIAGNOSIS — M5136 Other intervertebral disc degeneration, lumbar region: Secondary | ICD-10-CM | POA: Insufficient documentation

## 2020-05-14 DIAGNOSIS — M48061 Spinal stenosis, lumbar region without neurogenic claudication: Secondary | ICD-10-CM | POA: Insufficient documentation

## 2020-05-14 DIAGNOSIS — M1990 Unspecified osteoarthritis, unspecified site: Secondary | ICD-10-CM | POA: Insufficient documentation

## 2020-05-14 DIAGNOSIS — K5790 Diverticulosis of intestine, part unspecified, without perforation or abscess without bleeding: Secondary | ICD-10-CM | POA: Insufficient documentation

## 2020-05-14 DIAGNOSIS — L84 Corns and callosities: Secondary | ICD-10-CM

## 2020-05-14 DIAGNOSIS — Z6827 Body mass index (BMI) 27.0-27.9, adult: Secondary | ICD-10-CM

## 2020-05-14 DIAGNOSIS — G43109 Migraine with aura, not intractable, without status migrainosus: Secondary | ICD-10-CM | POA: Insufficient documentation

## 2020-05-14 DIAGNOSIS — M8580 Other specified disorders of bone density and structure, unspecified site: Secondary | ICD-10-CM | POA: Insufficient documentation

## 2020-05-14 DIAGNOSIS — G450 Vertebro-basilar artery syndrome: Secondary | ICD-10-CM

## 2020-05-14 LAB — URINALYSIS, COMPLETE
Bilirubin, UA: NEGATIVE
Glucose, UA: NEGATIVE
Ketones, UA: NEGATIVE
Leukocytes,UA: NEGATIVE
Nitrite, UA: NEGATIVE
Protein,UA: NEGATIVE
RBC, UA: NEGATIVE
Specific Gravity, UA: 1.02 (ref 1.005–1.030)
Urobilinogen, Ur: 0.2 mg/dL (ref 0.2–1.0)
pH, UA: 6 (ref 5.0–7.5)

## 2020-05-14 LAB — MICROSCOPIC EXAMINATION
Bacteria, UA: NONE SEEN
RBC, Urine: NONE SEEN /hpf (ref 0–2)
Renal Epithel, UA: NONE SEEN /hpf
WBC, UA: NONE SEEN /hpf (ref 0–5)

## 2020-05-14 NOTE — Patient Instructions (Addendum)

## 2020-05-14 NOTE — Progress Notes (Signed)
Established Patient Office Visit  Subjective:  Patient ID: Heidi Dorsey, female    DOB: [birth year]  Age: 87 y.o. MRN: [id number]  CC:  Chief Complaint  Patient presents with  . Medical Management of Chronic Issues    HPI Heidi Dorsey presents for chronic follow up  1. HTN Not currently on medication. She reports sensitivity to all antihypertensives. Metoprolol was stopped by her previous cardiologist.  Checking BP at home ranging 150s/60s. She would like to be established with a new cardiologist. Denies chest pain or shortness of breath. She has had a history of heart palpitations in the past but has never been diagnosed with an arrhthymia. She tries to follow a dash diet. She reports dizziness with head movements due to vertebrobasilar artery syndrome.   2. Callus on foot Heidi Dorsey has callus on the bottom of her left foot. She would like to see a podiatrist for this. She has deformities of her feet and has had a left plantar fibroma excision before.    Family, social, and smoking history reviewed.   BP Readings from Last 3 Encounters:  05/14/20 (!) 159/68  02/29/20 (!) 168/77  12/18/19 139/60   CMP Latest Ref Rng & Units 12/18/2019  Glucose 65 - 99 mg/dL 101(H)  BUN 8 - 27 mg/dL 16  Creatinine 0.57 - 1.00 mg/dL 0.74  Sodium 134 - 144 mmol/L 138  Potassium 3.5 - 5.2 mmol/L 4.7  Chloride 96 - 106 mmol/L 101  CO2 20 - 29 mmol/L 27  Calcium 8.7 - 10.3 mg/dL 9.4  Total Protein 6.0 - 8.5 g/dL 6.5  Total Bilirubin 0.0 - 1.2 mg/dL 0.3  Alkaline Phos 48 - 121 IU/L 71  AST 0 - 40 IU/L 17  ALT 0 - 32 IU/L 12      Past Medical History:  Diagnosis Date  . Asthma   . BPPV (benign paroxysmal positional vertigo)   . Claustrophobia   . Diverticulosis   . GERD (gastroesophageal reflux disease)   . H/O degenerative disc disease   . Hiatal hernia   . Hyperlipidemia   . Hypertension   . Kidney stones   . Migraines   . Osteoarthritis    Caused feet deformity  . Osteopenia    . Prediabetes   . Preglaucoma   . Raynauds syndrome   . Rectocele   . Rosacea   . Sciatica   . Sleep apnea   . Spinal stenosis   . Vaginal atrophy   . Vertebrobasilar artery syndrome     Past Surgical History:  Procedure Laterality Date  . ABDOMINAL HYSTERECTOMY  1978  . ANTERIOR (CYSTOCELE) AND POSTERIOR REPAIR (RECTOCELE) WITH XENFORM GRAFT AND SACROSPINOUS FIXATION    . BILATERAL OOPHORECTOMY  1997  . bladder stones     kidney colic/ procedure performed in 1973 at [place]  . BLADDER SUSPENSION    . BREAST BIOPSY Left 12/1987   neg  . BREAST BIOPSY Right 02/1977   neg papilloma  . CARDIAC CATHETERIZATION  2000  . HERNIA REPAIR    . plantar fibroma  Left 09/1997   Excised    Family History  Problem Relation Age of Onset  . Osteoporosis Mother   . Ovarian cancer Mother   . Uterine cancer Mother   . Brain cancer Father   . Bladder Cancer Brother   . Rectal cancer Brother   . Skin cancer Brother   . Osteoporosis Sister   . Diabetes Paternal Grandmother   . Breast cancer Neg Hx  Social History   Socioeconomic History  . Marital status: Single    Spouse name: Not on file  . Number of children: Not on file  . Years of education: Not on file  . Highest education level: Not on file  Occupational History  . Not on file  Tobacco Use  . Smoking status: Never Smoker  . Smokeless tobacco: Never Used  Vaping Use  . Vaping Use: Never used  Substance and Sexual Activity  . Alcohol use: Yes    Alcohol/week: 2.0 standard drinks    Types: 2 Glasses of wine per week    Comment: per week  . Drug use: Not Currently  . Sexual activity: Not Currently    Partners: Male    Birth control/protection: Surgical  Other Topics Concern  . Not on file  Social History Narrative   Right Handed   Lives in an apartment complex and her apartment is on ground level.    Drinks Half Decaf Coffee and Tea    Social Determinants of Health   Financial Resource Strain: Not on file   Food Insecurity: Not on file  Transportation Needs: Not on file  Physical Activity: Not on file  Stress: Not on file  Social Connections: Not on file  Intimate Partner Violence: Not on file    Outpatient Medications Prior to Visit  Medication Sig Dispense Refill  . Ascorbic Acid 500 MG CAPS Take by mouth.    . calcium carbonate (TUMS - DOSED IN MG ELEMENTAL CALCIUM) 500 MG chewable tablet Chew 1 tablet by mouth as needed for indigestion or heartburn.    . Multiple Vitamin (MULTI-VITAMIN) tablet Take by mouth.    . Omega-3 1000 MG CAPS Take by mouth.    . doxepin (SINEQUAN) 10 MG capsule Take 1 capsule (10 mg total) by mouth at bedtime as needed. (Patient not taking: No sig reported) 30 capsule 1   No facility-administered medications prior to visit.    Allergies  Allergen Reactions  . Calcitonin ([place]) Other (See Comments)    LEFT BUNDLE BRANCH BLOCK  . Estradiol Other (See Comments)    Headache  . Fexofenadine Other (See Comments)    Syncope  . Nsaids Other (See Comments)    Stomach ulcer after taking for 3 days in 2009. Negative H. Pylori test.  . Diphenhydramine Other (See Comments)    Syncope  . Clarithromycin Other (See Comments)    CAN TAKE AZITHROMYCIN  . Codeine Other (See Comments)  . Doxepin Hcl Other (See Comments)    Heart arrhythmia  . Epinephrine Other (See Comments)  . Erythromycin Other (See Comments)    Stomach irritation  . Fluticasone Other (See Comments)  . Fluticasone Propionate Other (See Comments)    And similar corticoid steroids  . Ipratropium Bromide Other (See Comments)    Syncope  . Lisinopril Other (See Comments)    Other Reaction: BRUISING, CANKERS & ARTHRALGIAS  . Other Other (See Comments)    Paraphenylendiamine/propanol amine/anti-cholinergic compounds  . Sulfa Antibiotics Other (See Comments)  . Tetracycline Other (See Comments)    Mouth sores after just 2 doses. DOXYCYCLINE is OK per patient  . Amoxicillin-Pot Clavulanate Rash     Able to take plain amoxicillin  . Ciprofloxacin Rash  . Latex Rash  . Nitrofurantoin Other (See Comments)    Multiple mild symptoms - would prefer not to take again    ROS Review of Systems Negative unless specially indicated above in HPI.    Objective:  Physical Exam Vitals and nursing note reviewed.  Constitutional:      General: She is not in acute distress.    Appearance: Normal appearance. She is normal weight. She is not ill-appearing, toxic-appearing or diaphoretic.  HENT:     Head: Normocephalic and atraumatic.  Eyes:     Conjunctiva/sclera: Conjunctivae normal.     Pupils: Pupils are equal, round, and reactive to light.  Neck:     Vascular: No carotid bruit.  Cardiovascular:     Rate and Rhythm: Normal rate and regular rhythm.     Pulses: Normal pulses.     Heart sounds: Normal heart sounds. No murmur heard.   Pulmonary:     Effort: Pulmonary effort is normal. No respiratory distress.  Abdominal:     General: Bowel sounds are normal.     Palpations: Abdomen is soft.  Musculoskeletal:     Cervical back: Normal range of motion and neck supple. No tenderness.  Feet:     Left foot:     Skin integrity: Callus (plantar) present.  Skin:    General: Skin is warm and dry.  Neurological:     General: No focal deficit present.     Mental Status: She is alert and oriented to person, place, and time.  Psychiatric:        Mood and Affect: Mood normal.        Behavior: Behavior normal.     BP (!) 159/68   Pulse 66   Temp (!) 97.4 F (36.3 C) (Temporal)   Ht $R'4\' 10"'la$  (1.473 m)   Wt 133 lb 6 oz (60.5 kg)   BMI 27.88 kg/m  Wt Readings from Last 3 Encounters:  05/14/20 133 lb 6 oz (60.5 kg)  02/29/20 132 lb (59.9 kg)  12/18/19 131 lb (59.4 kg)     There are no preventive care reminders to display for this patient.  There are no preventive care reminders to display for this patient.  No results found for: TSH Lab Results  Component Value Date   WBC  6.2 12/18/2019   HGB 13.9 12/18/2019   HCT 41.3 12/18/2019   MCV 89 12/18/2019   PLT 171 12/18/2019   Lab Results  Component Value Date   NA 138 12/18/2019   K 4.7 12/18/2019   CO2 27 12/18/2019   GLUCOSE 101 (H) 12/18/2019   BUN 16 12/18/2019   CREATININE 0.74 12/18/2019   BILITOT 0.3 12/18/2019   ALKPHOS 71 12/18/2019   AST 17 12/18/2019   ALT 12 12/18/2019   PROT 6.5 12/18/2019   ALBUMIN 4.4 12/18/2019   CALCIUM 9.4 12/18/2019   No results found for: CHOL No results found for: HDL No results found for: LDLCALC No results found for: TRIG No results found for: CHOLHDL No results found for: HGBA1C    Assessment & Plan:   Auna was seen today for medical management of chronic issues.  Diagnoses and all orders for this visit:  BMI 27.0-27.9,adult Labs pending as below. Diet and exercise.  -     CBC with Differential/Platelet -     Lipid panel -     CMP14+EGFR -     TSH  Hypertension, unspecified type Uncontrolled. Patient reports sensitivity to antihypertensives. Was previously seeing cardiology and would like to be established with a new one here. Labs pending as below. Diet and exercise.  -     Ambulatory referral to Cardiology -     CBC with Differential/Platelet -  Lipid panel -     CMP14+EGFR -     TSH  Heart palpitations History of heart palpitations.  -     Ambulatory referral to Cardiology  Hypercholesterolemia Diet and exercise. Labs pending.  -     Ambulatory referral to Cardiology -     Lipid panel  Callus of foot Callus of left foot. Referral placed per request.  -     Ambulatory referral to Podiatry  Nocturia Has been prescribed mirbetriq in the past but was unable to pick up due to the cost. She prefers not to take medication due to the potential side effects. UA normal today.  -     Urinalysis, Complete  Vertebrobasilar artery syndrome Discussed meclizine, patient declined.  No orders of the defined types were placed in this  encounter.   Follow-up: Return in about 3 months (around 08/11/2020) for chronic follow up.   The patient indicates understanding of these issues and agrees with the plan.  Gwenlyn Perking, FNP

## 2020-05-15 LAB — CMP14+EGFR
ALT: 17 IU/L (ref 0–32)
AST: 20 IU/L (ref 0–40)
Albumin/Globulin Ratio: 2.2 (ref 1.2–2.2)
Albumin: 4.7 g/dL — ABNORMAL HIGH (ref 3.6–4.6)
Alkaline Phosphatase: 86 IU/L (ref 44–121)
BUN/Creatinine Ratio: 21 (ref 12–28)
BUN: 16 mg/dL (ref 8–27)
Bilirubin Total: 0.2 mg/dL (ref 0.0–1.2)
CO2: 25 mmol/L (ref 20–29)
Calcium: 9.8 mg/dL (ref 8.7–10.3)
Chloride: 99 mmol/L (ref 96–106)
Creatinine, Ser: 0.78 mg/dL (ref 0.57–1.00)
GFR calc Af Amer: 79 mL/min/{1.73_m2} (ref >59)
GFR calc non Af Amer: 69 mL/min/{1.73_m2} (ref >59)
Globulin, Total: 2.1 g/dL (ref 1.5–4.5)
Glucose: 98 mg/dL (ref 65–99)
Potassium: 4.5 mmol/L (ref 3.5–5.2)
Sodium: 142 mmol/L (ref 134–144)
Total Protein: 6.8 g/dL (ref 6.0–8.5)

## 2020-05-15 LAB — LIPID PANEL
Chol/HDL Ratio: 2.8 ratio (ref 0.0–4.4)
Cholesterol, Total: 249 mg/dL — ABNORMAL HIGH (ref 100–199)
HDL: 88 mg/dL (ref >39)
LDL Chol Calc (NIH): 137 mg/dL — ABNORMAL HIGH (ref 0–99)
Triglycerides: 138 mg/dL (ref 0–149)
VLDL Cholesterol Cal: 24 mg/dL (ref 5–40)

## 2020-05-15 LAB — CBC WITH DIFFERENTIAL/PLATELET
Basophils Absolute: 0.1 10*3/uL (ref 0.0–0.2)
Basos: 1 %
EOS (ABSOLUTE): 0.1 10*3/uL (ref 0.0–0.4)
Eos: 1 %
Hematocrit: 41.8 % (ref 34.0–46.6)
Hemoglobin: 13.7 g/dL (ref 11.1–15.9)
Immature Grans (Abs): 0 10*3/uL (ref 0.0–0.1)
Immature Granulocytes: 0 %
Lymphocytes Absolute: 1.4 10*3/uL (ref 0.7–3.1)
Lymphs: 18 %
MCH: 28.7 pg (ref 26.6–33.0)
MCHC: 32.8 g/dL (ref 31.5–35.7)
MCV: 88 fL (ref 79–97)
Monocytes Absolute: 0.6 10*3/uL (ref 0.1–0.9)
Monocytes: 8 %
Neutrophils Absolute: 5.6 10*3/uL (ref 1.4–7.0)
Neutrophils: 72 %
Platelets: 185 10*3/uL (ref 150–450)
RBC: 4.77 x10E6/uL (ref 3.77–5.28)
RDW: 13.8 % (ref 11.7–15.4)
WBC: 7.7 10*3/uL (ref 3.4–10.8)

## 2020-05-15 LAB — TSH: TSH: 2.65 u[IU]/mL (ref 0.450–4.500)

## 2020-05-20 ENCOUNTER — Ambulatory Visit: Payer: Medicare Other

## 2020-05-27 ENCOUNTER — Other Ambulatory Visit: Payer: Self-pay

## 2020-05-27 ENCOUNTER — Encounter: Payer: Self-pay | Admitting: Family Medicine

## 2020-05-27 ENCOUNTER — Ambulatory Visit (INDEPENDENT_AMBULATORY_CARE_PROVIDER_SITE_OTHER): Payer: Medicare Other | Admitting: Family Medicine

## 2020-05-27 VITALS — BP 176/81 | HR 63 | Ht <= 58 in | Wt 133.0 lb

## 2020-05-27 DIAGNOSIS — I10 Essential (primary) hypertension: Secondary | ICD-10-CM | POA: Diagnosis not present

## 2020-05-27 DIAGNOSIS — K1370 Unspecified lesions of oral mucosa: Secondary | ICD-10-CM | POA: Diagnosis not present

## 2020-05-27 MED ORDER — MUPIROCIN 2 % EX OINT: 1.0000 "application " | TOPICAL_OINTMENT | Freq: Two times a day (BID) | CUTANEOUS | 0 refills | Status: DC

## 2020-05-27 NOTE — Progress Notes (Signed)
Established Patient Office Visit  Subjective:  Patient ID: Heidi Dorsey, female    DOB: Dec 16, 1930  Age: 87 y.o. MRN: [id number]  CC:  Chief Complaint  Patient presents with  . Mouth Lesions    HPI Heidi Dorsey presents for a mouth lesion. She reports the lesion have been present for a month or more. It is located just under her nose and above her lip on the right side. She reports that it is not painful or itching. It does sometimes ooze a yellow fluid. She has been using camphophenique without relief. She though that it was a cold sore, but it does not feel like a cold sore and it will not heal. She denies additional lesions. She would like it cultured today.   She reports her BP has been running 150s systolic at home. She does not take medication for her BP due to sensitivity to all antihypertensives. She has an appointment with cardiology scheduled. She denies chest pain or shortness of breath.   Past Medical History:  Diagnosis Date  . Asthma   . BPPV (benign paroxysmal positional vertigo)   . Claustrophobia   . Diverticulosis   . GERD (gastroesophageal reflux disease)   . H/O degenerative disc disease   . Hiatal hernia   . Hyperlipidemia   . Hypertension   . Kidney stones   . Migraines   . Osteoarthritis    Caused feet deformity  . Osteopenia   . Prediabetes   . Preglaucoma   . Raynauds syndrome   . Rectocele   . Rosacea   . Sciatica   . Sleep apnea   . Spinal stenosis   . Vaginal atrophy   . Vertebrobasilar artery syndrome     Past Surgical History:  Procedure Laterality Date  . ABDOMINAL HYSTERECTOMY  1978  . ANTERIOR (CYSTOCELE) AND POSTERIOR REPAIR (RECTOCELE) WITH XENFORM GRAFT AND SACROSPINOUS FIXATION    . BILATERAL OOPHORECTOMY  1997  . bladder stones     kidney colic/ procedure performed in 1973 at [place]  . BLADDER SUSPENSION    . BREAST BIOPSY Left 12/1987   neg  . BREAST BIOPSY Right 02/1977   neg papilloma  . CARDIAC CATHETERIZATION  2000   . HERNIA REPAIR    . plantar fibroma  Left 09/1997   Excised    Family History  Problem Relation Age of Onset  . Osteoporosis Mother   . Ovarian cancer Mother   . Uterine cancer Mother   . Brain cancer Father   . Bladder Cancer Brother   . Rectal cancer Brother   . Skin cancer Brother   . Osteoporosis Sister   . Diabetes Paternal Grandmother   . Breast cancer Neg Hx     Social History   Socioeconomic History  . Marital status: Single    Spouse name: Not on file  . Number of children: Not on file  . Years of education: Not on file  . Highest education level: Not on file  Occupational History  . Not on file  Tobacco Use  . Smoking status: Never Smoker  . Smokeless tobacco: Never Used  Vaping Use  . Vaping Use: Never used  Substance and Sexual Activity  . Alcohol use: Yes    Alcohol/week: 2.0 standard drinks    Types: 2 Glasses of wine per week    Comment: per week  . Drug use: Not Currently  . Sexual activity: Not Currently    Partners: Male    Birth control/protection:  Surgical  Other Topics Concern  . Not on file  Social History Narrative   Right Handed   Lives in an apartment complex and her apartment is on ground level.    Drinks Half Decaf Coffee and Tea    Social Determinants of Health   Financial Resource Strain: Not on file  Food Insecurity: Not on file  Transportation Needs: Not on file  Physical Activity: Not on file  Stress: Not on file  Social Connections: Not on file  Intimate Partner Violence: Not on file    Outpatient Medications Prior to Visit  Medication Sig Dispense Refill  . Ascorbic Acid 500 MG CAPS Take by mouth.    . calcium carbonate (TUMS - DOSED IN MG ELEMENTAL CALCIUM) 500 MG chewable tablet Chew 1 tablet by mouth as needed for indigestion or heartburn.    . Multiple Vitamin (MULTI-VITAMIN) tablet Take by mouth.    . Omega-3 1000 MG CAPS Take by mouth.     No facility-administered medications prior to visit.    Allergies   Allergen Reactions  . Calcitonin ([place]) Other (See Comments)    LEFT BUNDLE BRANCH BLOCK  . Estradiol Other (See Comments)    Headache  . Fexofenadine Other (See Comments)    Syncope  . Nsaids Other (See Comments)    Stomach ulcer after taking for 3 days in 2009. Negative H. Pylori test.  . Diphenhydramine Other (See Comments)    Syncope  . Clarithromycin Other (See Comments)    CAN TAKE AZITHROMYCIN  . Codeine Other (See Comments)  . Doxepin Hcl Other (See Comments)    Heart arrhythmia  . Epinephrine Other (See Comments)  . Erythromycin Other (See Comments)    Stomach irritation  . Fluticasone Other (See Comments)  . Fluticasone Propionate Other (See Comments)    And similar corticoid steroids  . Ipratropium Bromide Other (See Comments)    Syncope  . Lisinopril Other (See Comments)    Other Reaction: BRUISING, CANKERS & ARTHRALGIAS  . Other Other (See Comments)    Paraphenylendiamine/propanol amine/anti-cholinergic compounds  . Sulfa Antibiotics Other (See Comments)  . Tetracycline Other (See Comments)    Mouth sores after just 2 doses. DOXYCYCLINE is OK per patient  . Amoxicillin-Pot Clavulanate Rash    Able to take plain amoxicillin  . Ciprofloxacin Rash  . Latex Rash  . Nitrofurantoin Other (See Comments)    Multiple mild symptoms - would prefer not to take again    ROS Review of Systems As per HPI.   Objective:    Physical Exam Vitals and nursing note reviewed.  Constitutional:      General: She is not in acute distress.    Appearance: Normal appearance. She is not ill-appearing, toxic-appearing or diaphoretic.  HENT:     Head: Normocephalic and atraumatic.      Nose: No congestion.  Eyes:     Extraocular Movements: Extraocular movements intact.     Conjunctiva/sclera: Conjunctivae normal.  Pulmonary:     Effort: Pulmonary effort is normal. No respiratory distress.  Musculoskeletal:     Right lower leg: No edema.     Left lower leg: No edema.   Skin:    General: Skin is warm and dry.  Neurological:     General: No focal deficit present.     Mental Status: She is alert and oriented to person, place, and time.  Psychiatric:        Mood and Affect: Mood normal.  Behavior: Behavior normal.     BP (!) 176/81   Pulse 63   Ht 4\' 10"  (1.473 m)   Wt 133 lb (60.3 kg)   SpO2 99%   BMI 27.80 kg/m  Wt Readings from Last 3 Encounters:  05/27/20 133 lb (60.3 kg)  05/14/20 133 lb 6 oz (60.5 kg)  02/29/20 132 lb (59.9 kg)     There are no preventive care reminders to display for this patient.  There are no preventive care reminders to display for this patient.  Lab Results  Component Value Date   TSH 2.650 05/14/2020   Lab Results  Component Value Date   WBC 7.7 05/14/2020   HGB 13.7 05/14/2020   HCT 41.8 05/14/2020   MCV 88 05/14/2020   PLT 185 05/14/2020   Lab Results  Component Value Date   NA 142 05/14/2020   K 4.5 05/14/2020   CO2 25 05/14/2020   GLUCOSE 98 05/14/2020   BUN 16 05/14/2020   CREATININE 0.78 05/14/2020   BILITOT 0.2 05/14/2020   ALKPHOS 86 05/14/2020   AST 20 05/14/2020   ALT 17 05/14/2020   PROT 6.8 05/14/2020   ALBUMIN 4.7 (H) 05/14/2020   CALCIUM 9.8 05/14/2020   Lab Results  Component Value Date   CHOL 249 (H) 05/14/2020   Lab Results  Component Value Date   HDL 88 05/14/2020   Lab Results  Component Value Date   LDLCALC 137 (H) 05/14/2020   Lab Results  Component Value Date   TRIG 138 05/14/2020   Lab Results  Component Value Date   CHOLHDL 2.8 05/14/2020   No results found for: HGBA1C    Assessment & Plan:   Offie was seen today for mouth lesions.  Diagnoses and all orders for this visit:  Mouth lesion Appearance consistent with impetigo. Start bactroban. Culture pending per patient request. Patient would like to see dermatology if lesion does not heal with bactroban.  -     mupirocin ointment (BACTROBAN) 2 %; Apply 1 application topically 2 (two)  times daily. -     Anaerobic and Aerobic Culture  Hypertension, unspecified type 150s systolic at home. Patient will not take antihypertensives due to sensitivities. No chest pain or shortness of breath. She has an upcoming appointment with cardiology.  Follow-up: Return if symptoms worsen or fail to improve.   The patient indicates understanding of these issues and agrees with the plan.  Dorann Lodge, FNP

## 2020-05-27 NOTE — Patient Instructions (Signed)
Impetigo, Adult Impetigo is an infection of the skin. It commonly occurs in young children, but it can also occur in adults. The infection causes itchy blisters and sores that produce brownish-yellow fluid. As the fluid dries, it forms a thick, honey-colored crust. These skin changes usually occur on the face, but they can also affect other areas of the body. Impetigo usually goes away in 7-10 days with treatment. What are the causes? This condition is caused by two types of bacteria. It may be caused by staphylococci or streptococci bacteria. These bacteria cause impetigo when they get under the surface of the skin. This often happens after some damage to the skin, such as:  Cuts, scrapes, or scratches.  Rashes.  Insect bites, especially when you scratch the area of a bite.  Chickenpox or other illnesses that cause open skin sores.  Nail biting or chewing. Impetigo can spread easily from one person to another (is contagious). It may be spread through close skin contact or by sharing towels, clothing, or other items that an infected person has touched. Scratching the affected area can cause impetigo to spread to other parts of the body. The bacteria can get under your fingernails and spread when you touch another area of your skin. What increases the risk? The following factors may make you more likely to develop this condition:  Playing sports that include skin-to-skin contact with others.  Having broken skin, such as from a cut or scrape.  Living in an area that has high humidity levels.  Having poor hygiene.  Having high levels of staphylococci in your nose.  Having a condition that weakens the skin integrity, such as: ? Having a weak body defense system (immune system). ? Having a skin condition with open sores, such as chickenpox. ? Having diabetes. What are the signs or symptoms? The main symptom of this condition is small blisters, often on the face around the mouth and  nose. In time, the blisters break open and turn into tiny sores (lesions) with a yellow crust. In some cases, the blisters cause itching or burning. Scratching, irritation, or lack of treatment may cause these small lesions to get larger. Other possible symptoms include:  Larger blisters.  Pus.  Swollen lymph glands. How is this diagnosed? This condition is usually diagnosed during a physical exam. A skin sample or a sample of fluid from a blister may be taken for lab tests that involve growing bacteria (culture test). Lab tests can help confirm the diagnosis or help determine the best treatment. How is this treated? Treatment for this condition depends on the severity of the condition:  Mild impetigo can be treated with prescription antibiotic cream.  Oral antibiotic medicine may be used in more severe cases.  Medicines that reduce itchiness (antihistamines)may also be used. Follow these instructions at home: Medicines  Take over-the-counter and prescription medicines only as told by your health care provider.  Apply or take your antibiotic as told by your health care provider. Do not stop using the antibiotic even if your condition improves.  Before applying antibiotic cream or ointment, you should: ? Gently wash the infected areas with antibacterial soap and warm water. ? Soak crusted areas in warm, soapy water using antibacterial soap. ? Gently rub the areas to remove crusts. Do not scrub. Preventing the spread of infection  To help prevent impetigo from spreading to other body areas: ? Keep your fingernails short and clean. ? Do not scratch the blisters or sores. ? Cover infected  areas, if necessary, to keep from scratching. ? Wash your hands often with soap and warm water.  To help prevent impetigo from spreading to other people: ? Do not share towels. ? Wash your clothing and bedsheets in water that is 140F (60C) or warmer. ? Stay home until you have used an  antibiotic cream for 48 hours (2 days) or an oral antibiotic medicine for 24 hours (1 day).  You should only return to work and activities with other people if your skin shows significant improvement.  You may return to contact sports after you have used antibiotic medicine for 72 hours (3 days).   General instructions  Keep all follow-up visits. This is important. How is this prevented?  Wash your hands often with soap and warm water.  Do not share towels, washcloths, clothing, bedding, or razors.  Keep your fingernails short.  Keep any cuts, scrapes, bug bites, or rashes clean and covered.  Use insect repellent to prevent bug bites. Contact a health care provider if:  You develop more blisters or sores, even with treatment.  Other family members get sores.  Your skin sores are not improving after 72 hours (3 days) of treatment.  You have a fever. Get help right away if:  You see spreading redness or swelling of the skin around your sores.  You develop a sore throat.  The area around your rash becomes warm, red, or tender to the touch.  You have dark, reddish-brown urine.  You do not urinate often or you urinate small amounts.  You are very tired (lethargic).  You have swelling in your face, hands, or feet. Summary  Impetigo is a skin infection that causes itchy blisters and sores that produce brownish-yellow fluid. As the fluid dries, it forms a crust.  This condition is caused by staphylococci or streptococci bacteria. These bacteria cause impetigo when they get under the surface of the skin, such as through cuts, rashes, bug bites, or open sores.  Treatment for this condition may include antibiotic ointment or oral antibiotics.  To help prevent impetigo from spreading to other body areas, make sure you keep your fingernails short, avoid scratching, cover any blisters, and wash your hands often.  If you have impetigo, stay home until you have used an antibiotic  cream for 48 hours (2 days) or an oral antibiotic medicine for 24 hours (1 day). You should only return to work and activities with other people if your skin shows significant improvement. This information is not intended to replace advice given to you by your health care provider. Make sure you discuss any questions you have with your health care provider. Document Revised: 08/29/2019 Document Reviewed: 08/29/2019 Elsevier Patient Education  2021 [employer].

## 2020-05-29 ENCOUNTER — Telehealth: Payer: Self-pay | Admitting: *Deleted

## 2020-05-29 NOTE — Telephone Encounter (Signed)
Pt came in Tuesday. She is calling now, stating that the BP cuff was way too tight and it bruised her. She now has a tennis ball size hematoma below her elbow and it is bruised to her wrist. She wants to make sure it is not a blood clot? Please advise

## 2020-05-29 NOTE — Telephone Encounter (Signed)
Unfortunately the BP cuff was so tight due to her elevated BP. The hematoma will reabsorb. She can apply some heat to the area. She can apply an ace bandage and elevate her arm as well. She may request that her BP be checked manually at her next visit to hopefully prevent this from happening again.   Routing comment     Above was a routing comment from [employer] FNP-C, pt was advised of provider feedback and voiced understanding.

## 2020-05-31 ENCOUNTER — Encounter (HOSPITAL_COMMUNITY): Payer: Self-pay | Admitting: Emergency Medicine

## 2020-05-31 ENCOUNTER — Other Ambulatory Visit: Payer: Self-pay

## 2020-05-31 ENCOUNTER — Emergency Department (HOSPITAL_COMMUNITY)
Admission: EM | Admit: 2020-05-31 | Discharge: 2020-05-31 | Disposition: A | Discharge status: Home / Self Care | Payer: Medicare Other | Attending: Emergency Medicine | Admitting: Emergency Medicine

## 2020-05-31 DIAGNOSIS — T148XXA Other injury of unspecified body region, initial encounter: Secondary | ICD-10-CM

## 2020-05-31 DIAGNOSIS — S5012XA Contusion of left forearm, initial encounter: Secondary | ICD-10-CM | POA: Diagnosis not present

## 2020-05-31 DIAGNOSIS — X58XXXA Exposure to other specified factors, initial encounter: Secondary | ICD-10-CM | POA: Diagnosis not present

## 2020-05-31 DIAGNOSIS — S59912A Unspecified injury of left forearm, initial encounter: Secondary | ICD-10-CM | POA: Diagnosis present

## 2020-05-31 LAB — COMPREHENSIVE METABOLIC PANEL
ALT: 19 U/L (ref 0–44)
AST: 20 U/L (ref 15–41)
Albumin: 4 g/dL (ref 3.5–5.0)
Alkaline Phosphatase: 60 U/L (ref 38–126)
Anion gap: 8 (ref 5–15)
BUN: 11 mg/dL (ref 8–23)
CO2: 27 mmol/L (ref 22–32)
Calcium: 9.1 mg/dL (ref 8.9–10.3)
Chloride: 104 mmol/L (ref 98–111)
Creatinine, Ser: 0.73 mg/dL (ref 0.44–1.00)
GFR, Estimated: >60 mL/min (ref >60)
Glucose, Bld: 104 mg/dL — ABNORMAL HIGH (ref 70–99)
Potassium: 3.9 mmol/L (ref 3.5–5.1)
Sodium: 139 mmol/L (ref 135–145)
Total Bilirubin: 0.7 mg/dL (ref 0.3–1.2)
Total Protein: 6.4 g/dL — ABNORMAL LOW (ref 6.5–8.1)

## 2020-05-31 LAB — CBC
HCT: 37.9 % (ref 36.0–46.0)
Hemoglobin: 12.7 g/dL (ref 12.0–15.0)
MCH: 30.1 pg (ref 26.0–34.0)
MCHC: 33.5 g/dL (ref 30.0–36.0)
MCV: 89.8 fL (ref 80.0–100.0)
Platelets: 155 10*3/uL (ref 150–400)
RBC: 4.22 MIL/uL (ref 3.87–5.11)
RDW: 14.7 % (ref 11.5–15.5)
WBC: 5.8 10*3/uL (ref 4.0–10.5)
nRBC: 0 % (ref 0.0–0.2)

## 2020-05-31 LAB — APTT: aPTT: 26 seconds (ref 24–36)

## 2020-05-31 LAB — PROTIME-INR
INR: 1 (ref 0.8–1.2)
Prothrombin Time: 13.1 seconds (ref 11.4–15.2)

## 2020-05-31 NOTE — ED Triage Notes (Signed)
Pt. Stated, I had a BP took on Monday and it left bruising on my left arm and I guess it will cause bruising on my rt. Arm niow cause they get too tight.  Explained to pt. The reason it gets tight. Does not take blood thinners.

## 2020-05-31 NOTE — ED Notes (Signed)
Pt stopped this tech asking what the "pecking order" is for going back to be seen and that she is concerned that her hematoma is spreading. This tech advised her that patients are seen based on how critical they are and that "this isn't a restaurant, it is not first come first served type deal". This tech marked the edges with a sharpie. Pt is concerned that she will sit in the waiting room and have a stroke. Pt is also concerned for discomfort in her knee. This tech notates NO stroke like symptoms (dysphagia, one sided weakness, AMS.) at present but notifies triage RN of patient concern. This tech informed patient that she would put a note in her chart with her concerns for the provider.

## 2020-05-31 NOTE — ED Notes (Signed)
The pt told this tech she could not tolerate a machine taking her blood pressure and will not allow me to try agian notified [place].

## 2020-05-31 NOTE — ED Notes (Signed)
PT will not let RN stick her nor take her BP. Pt stated that the protime- INR needs to be a finger draw and not through her vein. RN explained the benefits of an IV.

## 2020-05-31 NOTE — ED Notes (Signed)
Pts family member yelled at [employer] stating she is being hijacked and that we are making them stay here so we can get paid. RN responded that she will have to call security if she does not stop yelling and exmplalined why they are here. MD aware. Family member stated she is going to smoke a cigarette and come back.

## 2020-05-31 NOTE — Discharge Instructions (Addendum)
Your blood work looks reassuring for causes of bruising.  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

## 2020-06-01 NOTE — ED Provider Notes (Signed)
[facility] EMERGENCY DEPARTMENT Provider Note   CSN: [id number] Arrival date & time: 05/31/20  1052     History Chief Complaint  Patient presents with  . Bleeding/Bruising    Heidi Dorsey is a 87 y.o. female.  HPI Patient presents with bruising on her left forearm.  Began Monday or Tuesday.  Has continued.  Mild pain.  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  States her father had similar deficiency.  Patient had been a scientist..  States that there is no known injury to her left forearm.  States she thought it may have started after a blood pressure cuff at the primary care doctor's office on Monday.  However with further history it sounds as if the blood pressure cuff was on the other arm.  No chest pain.  No trouble breathing.  No lightheadedness or dizziness.  States she does not have a hematologist here yet but is going to follow-up with cardiologist soon.  No real pain with moving the elbow.    Past Medical History:  Diagnosis Date  . Asthma   . BPPV (benign paroxysmal positional vertigo)   . Claustrophobia   . Diverticulosis   . GERD (gastroesophageal reflux disease)   . H/O degenerative disc disease   . Hiatal hernia   . Hyperlipidemia   . Hypertension   . Kidney stones   . Migraines   . Osteoarthritis    Caused feet deformity  . Osteopenia   . Prediabetes   . Preglaucoma   . Raynauds syndrome   . Rectocele   . Rosacea   . Sciatica   . Sleep apnea   . Spinal stenosis   . Vaginal atrophy   . Vertebrobasilar artery syndrome     Patient Active Problem List   Diagnosis Date Noted  . Allergic rhinitis 05/14/2020  . DDD (degenerative disc disease), lumbar 05/14/2020  . Diverticulosis 05/14/2020  . GERD (gastroesophageal reflux disease) 05/14/2020  . Hypercholesterolemia 05/14/2020  . Lumbar stenosis 05/14/2020  . Migraine with aura and without status migrainosus, not intractable 05/14/2020  . Osteoarthritis  05/14/2020  . Osteopenia 05/14/2020  . Primary insomnia 12/18/2019  . Paresthesia and pain of left extremity 12/18/2019  . Stage 3a chronic kidney disease (HCC) 08/23/2019  . Chronic pain of left knee 12/20/2017  . Primary osteoarthritis of left knee 12/20/2017  . Heart palpitations 02/16/2016  . Raynaud disease 10/27/2015  . Numbness 02/07/2015  . Essential hypertension 04/16/2014    Past Surgical History:  Procedure Laterality Date  . ABDOMINAL HYSTERECTOMY  1978  . ANTERIOR (CYSTOCELE) AND POSTERIOR REPAIR (RECTOCELE) WITH XENFORM GRAFT AND SACROSPINOUS FIXATION    . BILATERAL OOPHORECTOMY  1997  . bladder stones     kidney colic/ procedure performed in 1973 at [place]  . BLADDER SUSPENSION    . BREAST BIOPSY Left 12/1987   neg  . BREAST BIOPSY Right 02/1977   neg papilloma  . CARDIAC CATHETERIZATION  2000  . HERNIA REPAIR    . plantar fibroma  Left 09/1997   Excised     OB History   No obstetric history on file.     Family History  Problem Relation Age of Onset  . Osteoporosis Mother   . Ovarian cancer Mother   . Uterine cancer Mother   . Brain cancer Father   . Bladder Cancer Brother   . Rectal cancer Brother   . Skin cancer Brother   . Osteoporosis Sister   .  Diabetes Paternal Grandmother   . Breast cancer Neg Hx     Social History   Tobacco Use  . Smoking status: Never Smoker  . Smokeless tobacco: Never Used  Vaping Use  . Vaping Use: Never used  Substance Use Topics  . Alcohol use: Yes    Alcohol/week: 2.0 standard drinks    Types: 2 Glasses of wine per week    Comment: per week  . Drug use: Not Currently    Home Medications Prior to Admission medications   Medication Sig Start Date End Date Taking? Authorizing Provider  Ascorbic Acid 500 MG CAPS Take by mouth.    [provider]  calcium carbonate (TUMS - DOSED IN MG ELEMENTAL CALCIUM) 500 MG chewable tablet Chew 1 tablet by mouth as needed for indigestion or heartburn.    [provider]  Multiple Vitamin (MULTI-VITAMIN) tablet Take by mouth.    [provider]  mupirocin ointment (BACTROBAN) 2 % Apply 1 application topically 2 (two) times daily. 05/27/20   Gabriel Earing, FNP  Omega-3 1000 MG CAPS Take by mouth. 08/25/09   [provider]    Allergies    Calcitonin (salmon), Estradiol, Fexofenadine, Nsaids, Diphenhydramine, Clarithromycin, Codeine, Doxepin hcl, Epinephrine, Erythromycin, Fluticasone, Fluticasone propionate, Ipratropium bromide, Lisinopril, Other, Sulfa antibiotics, Tetracycline, Amoxicillin-pot clavulanate, Ciprofloxacin, Latex, and Nitrofurantoin  Review of Systems   Review of Systems  Constitutional: Negative for appetite change.  HENT: Negative for congestion.   Respiratory: Negative for shortness of breath.   Gastrointestinal: Negative for abdominal pain.  Genitourinary: Negative for enuresis.  Musculoskeletal: Negative for back pain.  Skin: Negative for rash.  Neurological: Negative for weakness.  Hematological: Bruises/bleeds easily.  Psychiatric/Behavioral: Negative for confusion.    Physical Exam Updated Vital Signs BP (!) 158/82   Pulse 74   Temp 98 F (36.7 C) (Oral)   Resp (!) 24   SpO2 100%   Physical Exam Vitals and nursing note reviewed.  HENT:     Head: Atraumatic.  Eyes:     General: No scleral icterus. Cardiovascular:     Rate and Rhythm: Regular rhythm.  Pulmonary:     Breath sounds: No wheezing or rhonchi.  Musculoskeletal:        General: Tenderness present.     Cervical back: Neck supple.     Comments: Mild tenderness over radial head.  No pain with pronation and supination of forearm however.  Has olecranon bursa with some swelling.  No tenderness.  There is no bruising around the bursa but there is bruising most of the rest of the forearm.  Not tense.  Good forearm movement.  Good wrist movement.  Good extension and flexion of the fingers.  Good pulse.  Skin:    Capillary Refill:  Capillary refill takes less than 2 seconds.  Neurological:     Mental Status: She is oriented to person, place, and time.     ED Results / Procedures / Treatments   Labs (all labs ordered are listed, but only abnormal results are displayed) Labs Reviewed  COMPREHENSIVE METABOLIC PANEL - Abnormal; Notable for the following components:      Result Value   Glucose, Bld 104 (*)    Total Protein 6.4 (*)    All other components within normal limits  PROTIME-INR  APTT  CBC    EKG None  Radiology No results found.  Procedures Procedures   Medications Ordered in ED Medications - No data to display  ED Course  I  have reviewed the triage vital signs and the nursing notes.  Pertinent labs & imaging results that were available during my care of the patient were reviewed by me and considered in my medical decision making (see chart for details).    MDM Rules/Calculators/A&P                          Patient presents with bruising to forearm.  No known injury.  Did have some mild swelling of the bursa on the left elbow.  Mild tenderness over the radial head but no injury.  Patient declined x-ray.  Basic blood work done due to easy bruising without injury.  Patient states she has a thrombomodulin abnormality.  Lab work reassuring.  Discharge home with outpatient follow-up.  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 Final Clinical Impression(s) / ED Diagnoses Final diagnoses:  Bruising    Rx / DC Orders ED Discharge Orders    None       Benjiman Core, MD 06/01/20 [phone]

## 2020-06-02 ENCOUNTER — Other Ambulatory Visit: Payer: Self-pay | Admitting: Family Medicine

## 2020-06-02 DIAGNOSIS — L989 Disorder of the skin and subcutaneous tissue, unspecified: Secondary | ICD-10-CM

## 2020-06-02 LAB — ANAEROBIC AND AEROBIC CULTURE

## 2020-06-03 ENCOUNTER — Encounter: Payer: Self-pay | Admitting: Cardiology

## 2020-06-03 NOTE — Progress Notes (Addendum)
Cardiology Office Note   Date:  07/09/2020   ID:  Mardelle, Pandolfi Aug 22, 1930, MRN [id number]  PCP:  Gabriel Earing, FNP  Cardiologist:   No primary care provider on file. Referring:  Gabriel Earing, FNP  Chief Complaint  Patient presents with  . Palpitations      History of Present Illness: Heidi Dorsey is a 87 y.o. female who presents for evaluation of palpitations.   This is her first visit with me.  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  She was previously seen Dr. Cassie Freer but moved here to be closer to one of her children.  She had 5 children but one of them, a [employer], was killed in the [employer] 20 years ago.  She had a long history of left bundle branch block.  I looked through records and saw that she has had a cardiac catheterization in 2000.  She does not remember the results but was not told that she had vascular disease.  I see that she has had carotid Dopplers which were unremarkable.  She had a very mildly reduced ejection fraction on the stress echo in 2017 with her EF being 50%.  She has had a 24-hour Holter.  She previously has been treated with metoprolol.  Most recently she was on Cozaar 12.5 mg daily but this was discontinued because her blood pressures were running low.  She is not noticing any symptoms such as chest pressure, neck or arm discomfort.  She has no shortness of breath, PND or orthopnea.  She has no palpitations.  She is limited by some spinal stenosis and degenerative disc disease.  She has nocturia.   Past Medical History:  Diagnosis Date  . Asthma   . BPPV (benign paroxysmal positional vertigo)   . Claustrophobia   . Diverticulosis   . GERD (gastroesophageal reflux disease)   . H/O degenerative disc disease   . Hiatal hernia   . Hyperlipidemia   . Hypertension   . Kidney stones   . Migraines   . Osteoarthritis    Caused feet deformity  . Osteopenia   . Prediabetes   . Preglaucoma   . Raynauds  syndrome   . Rectocele   . Rosacea   . Sciatica   . Sleep apnea   . Spinal stenosis   . Vertebrobasilar artery syndrome     Past Surgical History:  Procedure Laterality Date  . ABDOMINAL HYSTERECTOMY  1978  . ANTERIOR (CYSTOCELE) AND POSTERIOR REPAIR (RECTOCELE) WITH XENFORM GRAFT AND SACROSPINOUS FIXATION    . BILATERAL OOPHORECTOMY  1997  . bladder stones     kidney colic/ procedure performed in 1973 at [place]  . BLADDER SUSPENSION    . BREAST BIOPSY Left 12/1987   neg  . BREAST BIOPSY Right 02/1977   neg papilloma  . CARDIAC CATHETERIZATION  2000  . HERNIA REPAIR    . plantar fibroma  Left 09/1997   Excised     Current Outpatient Medications  Medication Sig Dispense Refill  . Ascorbic Acid 500 MG CAPS Take by mouth.    . calcium carbonate (TUMS - DOSED IN MG ELEMENTAL CALCIUM) 500 MG chewable tablet Chew 1 tablet by mouth as needed for indigestion or heartburn.    . Multiple Vitamin (MULTI-VITAMIN) tablet Take by mouth.    . mupirocin ointment (BACTROBAN) 2 % Apply 1 application topically 2 (two) times daily. 22 g 0  . Omega-3 1000 MG CAPS Take  by mouth.    . metoprolol succinate (TOPROL XL) 25 MG 24 hr tablet Take 1 tablet (25 mg total) by mouth daily. 90 tablet 3   No current facility-administered medications for this visit.    Allergies:   Calcitonin (salmon), Estradiol, Fexofenadine, Miacalcin [calcitonin], Nsaids, Diphenhydramine, Clarithromycin, Codeine, Doxepin hcl, Epinephrine, Erythromycin, Fluticasone, Fluticasone propionate, Ipratropium bromide, Lisinopril, Other, Sulfa antibiotics, Tetracycline, Amoxicillin-pot clavulanate, Ciprofloxacin, Latex, and Nitrofurantoin    Social History:  The patient  reports that she has never smoked. She has never used smokeless tobacco. She reports current alcohol use of about 2.0 standard drinks of alcohol per week. She reports that she does not use drugs.   Family History:  The patient's family history includes Bladder Cancer  in her brother; Brain cancer in her father; Diabetes in her paternal grandmother; Osteoporosis in her mother and sister; Ovarian cancer in her mother; Rectal cancer in her brother; Skin cancer in her brother; Uterine cancer in her mother.    ROS:  Please see the history of present illness.   Otherwise, review of systems are positive for easy bruising.   All other systems are reviewed and negative.    PHYSICAL EXAM: VS:  BP (!) 168/72   Pulse 67   Ht 4\' 10"  (1.473 m)   Wt 134 lb (60.8 kg)   BMI 28.01 kg/m  , BMI Body mass index is 28.01 kg/m. GENERAL:  Well appearing HEENT:  Pupils equal round and reactive, fundi not visualized, oral mucosa unremarkable NECK:  No jugular venous distention, waveform within normal limits, carotid upstroke brisk and symmetric, no bruits, no thyromegaly LYMPHATICS:  No cervical, inguinal adenopathy LUNGS:  Clear to auscultation bilaterally BACK:  No CVA tenderness CHEST:  Unremarkable HEART:  PMI not displaced or sustained,S1 and S2 within normal limits, no S3, no S4, no clicks, no rubs, no murmurs ABD:  Flat, positive bowel sounds normal in frequency in pitch, no bruits, no rebound, no guarding, no midline pulsatile mass, no hepatomegaly, no splenomegaly EXT:  2 plus pulses throughout, no edema, no cyanosis no clubbing SKIN:  No rashes no nodules NEURO:  Cranial nerves II through XII grossly intact, motor grossly intact throughout PSYCH:  Cognitively intact, oriented to person place and time    EKG:  EKG is ordered today. The ekg ordered today demonstrates sinus rhythm, rate 67, left bundle branch block   Recent Labs: 05/14/2020: TSH 2.650 05/31/2020: ALT 19; BUN 11; Creatinine, Ser 0.73; Hemoglobin 12.7; Platelets 155; Potassium 3.9; Sodium 139    Lipid Panel    Component Value Date/Time   CHOL 249 (H) 05/14/2020 1401   TRIG 138 05/14/2020 1401   HDL 88 05/14/2020 1401   CHOLHDL 2.8 05/14/2020 1401   LDLCALC 137 (H) 05/14/2020 1401      Wt  Readings from Last 3 Encounters:  06/04/20 134 lb (60.8 kg)  05/27/20 133 lb (60.3 kg)  05/14/20 133 lb 6 oz (60.5 kg)      Other studies Reviewed: Additional studies/ records that were reviewed today include: Care Everywhere. Review of the above records demonstrates:  Please see elsewhere in the note.     ASSESSMENT AND PLAN:  PALPITATIONS: She not particular bothered by these.  No change in therapy.  HTN: Her blood pressure has been creeping up.  I am going to start with Cozaar 25 mg.  She tolerated the lower dose of this previously but had trouble cutting it.  She will let me know if she has any difficulty  with this and she will keep a blood pressure diary.  LBBB: She has had extensive work-up and this has been chronic.  No further work-up is suggested.   Current medicines are reviewed at length with the patient today.  The patient does not have concerns regarding medicines.  The following changes have been made: As above  Labs/ tests ordered today include: None  Orders Placed This Encounter  Procedures  . EKG 12-Lead     Disposition:   FU with 4 months   Signed, Rollene Rotunda, MD  07/09/2020 8:25 AM    [facility]

## 2020-06-04 ENCOUNTER — Other Ambulatory Visit: Payer: Self-pay

## 2020-06-04 ENCOUNTER — Encounter: Payer: Self-pay | Admitting: Cardiology

## 2020-06-04 ENCOUNTER — Ambulatory Visit (INDEPENDENT_AMBULATORY_CARE_PROVIDER_SITE_OTHER): Payer: Medicare Other | Admitting: Cardiology

## 2020-06-04 VITALS — BP 168/72 | HR 67 | Ht <= 58 in | Wt 134.0 lb

## 2020-06-04 DIAGNOSIS — I10 Essential (primary) hypertension: Secondary | ICD-10-CM

## 2020-06-04 MED ORDER — LOSARTAN POTASSIUM 25 MG PO TABS: 25.0000 mg | ORAL_TABLET | Freq: Every day | ORAL | 3 refills | Status: DC

## 2020-06-04 NOTE — Patient Instructions (Signed)
Medication Instructions:  Please start Cozaar (Losartan) 25 mg once a day. Continue all other medications as listed.  *If you need a refill on your cardiac medications before your next appointment, please call your pharmacy*  Follow-Up: At [facility], you and your health needs are our priority.  As part of our continuing mission to provide you with exceptional heart care, we have created designated Provider Care Teams.  These Care Teams include your primary Cardiologist (physician) and Advanced Practice Providers (APPs -  Physician Assistants and Nurse Practitioners) who all work together to provide you with the care you need, when you need it.  We recommend signing up for the patient portal called "MyChart".  Sign up information is provided on this After Visit Summary.  MyChart is used to connect with patients for Virtual Visits (Telemedicine).  Patients are able to view lab/test results, encounter notes, upcoming appointments, etc.  Non-urgent messages can be sent to your provider as well.   To learn more about what you can do with MyChart, go to [email or web address].    Your next appointment:   4 month(s)  The format for your next appointment:   In Person  Provider:   Rollene Rotunda, MD   Thank you for choosing [facility]!!

## 2020-06-06 ENCOUNTER — Telehealth: Payer: Self-pay | Admitting: Hematology

## 2020-06-06 ENCOUNTER — Telehealth: Payer: Self-pay | Admitting: Cardiology

## 2020-06-06 MED ORDER — METOPROLOL SUCCINATE ER 25 MG PO TB24: 25.0000 mg | ORAL_TABLET | Freq: Every day | ORAL | 3 refills | Status: DC

## 2020-06-06 NOTE — Telephone Encounter (Signed)
Contacted patient and she stated that she had taken a look at her medical history. While she was on Losartan previously, she had a massive eye bleed. She has not started Losartan since her last OV. She would rather be placed on Metoprolol. Please advise.

## 2020-06-06 NOTE — Telephone Encounter (Signed)
Received a call from Ms. Sperry to schedule an appt for bruising. Pt was seen in the hospital and was told to follow up with a hematologist. Ms. Arvanitis has been scheduled to see Dr. Candise Che on 3/9 at 11am.

## 2020-06-06 NOTE — Telephone Encounter (Signed)
Please stop Cozaar and start Toprol XL 25 mg daily.

## 2020-06-06 NOTE — Telephone Encounter (Signed)
New message    Pt c/o medication issue:  1. Name of Medication:  losartan  2. How are you currently taking this medication (dosage and times per day)?  Did not start medication   3. Are you having a reaction (difficulty breathing--STAT)? Yes   4. What is your medication issue? She has problem with clotting on this medication     Patient wants medication changed to metoprolol  And called into [facility]

## 2020-06-06 NOTE — Telephone Encounter (Signed)
Spoke with patient and relayed Dr. Jenene Slicker recommendations. Losartan discontinued and metoprolol succinate 25mg  daily initiated. Prescription sent to [facility].

## 2020-06-18 ENCOUNTER — Encounter: Payer: Medicare Other | Admitting: Hematology

## 2020-07-03 ENCOUNTER — Other Ambulatory Visit: Payer: Self-pay | Admitting: Family Medicine

## 2020-07-03 DIAGNOSIS — Z1231 Encounter for screening mammogram for malignant neoplasm of breast: Secondary | ICD-10-CM

## 2020-07-08 ENCOUNTER — Telehealth: Payer: Self-pay | Admitting: Family Medicine

## 2020-07-08 NOTE — Telephone Encounter (Signed)
Pt would like to switch from [place] to [place] for her PCP

## 2020-07-09 ENCOUNTER — Ambulatory Visit: Payer: Medicare Other | Admitting: Cardiology

## 2020-07-09 ENCOUNTER — Encounter: Payer: Self-pay | Admitting: Cardiology

## 2020-07-09 ENCOUNTER — Other Ambulatory Visit: Payer: Self-pay

## 2020-07-09 ENCOUNTER — Ambulatory Visit
Admission: RE | Admit: 2020-07-09 | Discharge: 2020-07-09 | Disposition: A | Discharge status: Home / Self Care | Payer: Medicare Other | Source: Ambulatory Visit | Attending: Family Medicine | Admitting: Family Medicine

## 2020-07-09 DIAGNOSIS — Z1231 Encounter for screening mammogram for malignant neoplasm of breast: Secondary | ICD-10-CM

## 2020-07-09 NOTE — Telephone Encounter (Signed)
Dr. Darlyn Read are you able to take on this pt.  Informed pt that if you were able to add her to your panel that we do not need to call her back. Otherwise, pt will need a return call about another PCP.

## 2020-07-09 NOTE — Telephone Encounter (Signed)
That would be fine 

## 2020-07-09 NOTE — Telephone Encounter (Signed)
PCP changed to [facility]

## 2020-07-09 NOTE — Telephone Encounter (Signed)
That is fine with me.

## 2020-07-16 ENCOUNTER — Ambulatory Visit (INDEPENDENT_AMBULATORY_CARE_PROVIDER_SITE_OTHER): Payer: Medicare Other | Admitting: Family Medicine

## 2020-07-16 ENCOUNTER — Encounter: Payer: Self-pay | Admitting: Family Medicine

## 2020-07-16 ENCOUNTER — Other Ambulatory Visit: Payer: Self-pay

## 2020-07-16 VITALS — BP 156/64 | HR 65 | Temp 97.8°F | Ht <= 58 in | Wt 135.2 lb

## 2020-07-16 DIAGNOSIS — I10 Essential (primary) hypertension: Secondary | ICD-10-CM

## 2020-07-16 DIAGNOSIS — R350 Frequency of micturition: Secondary | ICD-10-CM | POA: Diagnosis not present

## 2020-07-16 DIAGNOSIS — E559 Vitamin D deficiency, unspecified: Secondary | ICD-10-CM

## 2020-07-16 DIAGNOSIS — M5136 Other intervertebral disc degeneration, lumbar region: Secondary | ICD-10-CM | POA: Diagnosis not present

## 2020-07-16 MED ORDER — MIRABEGRON ER 25 MG PO TB24: 25.0000 mg | ORAL_TABLET | Freq: Every day | ORAL | 2 refills | Status: DC

## 2020-07-16 MED ORDER — AMLODIPINE BESYLATE 2.5 MG PO TABS: 2.5000 mg | ORAL_TABLET | Freq: Every day | ORAL | 3 refills | Status: DC

## 2020-07-16 MED ORDER — AMLODIPINE BESYLATE 5 MG PO TABS: 5.0000 mg | ORAL_TABLET | Freq: Every day | ORAL | 5 refills | Status: DC

## 2020-07-16 NOTE — Progress Notes (Signed)
Subjective:  Patient ID: Heidi Dorsey, female    DOB: [birth year]  Age: 87 y.o. MRN: [id number]  CC: Hypertension   HPI Heidi Dorsey presents for from [facility] area. Retired from [employer]. Worked at [employer] and at [employer]. Retired in 1999.  Pt. Having trouble with DDD and nocturia. Awakening multiple timesat night. Has seen D. Patel from neurology. Didn't get the answers she wanted. Wants to avoid surgery.  She would like to try low-dose medications to see if those would help with her urinary frequency and poor emptying.  She denies any incontinence.  She says she gets some relief from Azo temporarily.  This helps her to avoid going to the bathroom quite as often.  She does not have a history of herniated disc or radiculopathy.  There has been no evidence of autonomic dysfunction.  Concerned that a statement in her chart could imply she is a drug user. She denies at this time every using illicit drugs. In fact states she is hesitant about prescription meds.  Patient wants to be checked for diabetes.  She is concerned about the past blood sugar elevations.  Patient is also being treated for elevated blood pressure.  She is concerned that she has side effects particularly from related to blood pressure but they were reviewed as part of this evaluation.  Metoprolol causing numbness and tingling pain in her left arm.  Losartan caused bruising and joint pain.  Multiple other allergies are noted   Patient was treated for pain and osteopenia in the spine with Miacalcin in the past.  She did not tolerate that.  She is concerned about using any calcium blocker as a result.  I explained to her that calcitonin was a calcium regulating hormone but it was not a calcium blocker and it was used for blood pressure.  Depression screen [facility] 2/9 07/16/2020 05/27/2020 05/14/2020  Decreased Interest 0 0 0  Down, Depressed, Hopeless 0 0 0  PHQ - 2 Score 0 0 0    History Heidi Dorsey has a past medical history of  Asthma, BPPV (benign paroxysmal positional vertigo), Claustrophobia, Diverticulosis, GERD (gastroesophageal reflux disease), H/O degenerative disc disease, Hiatal hernia, Hyperlipidemia, Hypertension, Kidney stones, Migraines, Osteoarthritis, Osteopenia, Prediabetes, Preglaucoma, Raynauds syndrome, Rectocele, Rosacea, Sciatica, Sleep apnea, Spinal stenosis, and Vertebrobasilar artery syndrome.   She has a past surgical history that includes Breast biopsy (Left, 12/1987); Breast biopsy (Right, 02/1977); Cardiac catheterization (2000); Hernia repair; Abdominal hysterectomy (1978); Anterior (cystocele) and posterior repair (rectocele) with xenform graft and sacrospinous fixation; Bladder suspension; plantar fibroma  (Left, 09/1997); Bilateral oophorectomy (1997); and bladder stones.   Her family history includes Bladder Cancer in her brother; Brain cancer in her father; Diabetes in her paternal grandmother; Osteoporosis in her mother and sister; Ovarian cancer in her mother; Rectal cancer in her brother; Skin cancer in her brother; Uterine cancer in her mother.She reports that she has never smoked. She has never used smokeless tobacco. She reports current alcohol use of about 2.0 standard drinks of alcohol per week. She reports that she does not use drugs.    ROS Review of Systems  Constitutional: Negative.   HENT: Negative.   Eyes: Negative for visual disturbance.  Respiratory: Negative for shortness of breath.   Cardiovascular: Negative for chest pain.  Gastrointestinal: Negative for abdominal pain.  Genitourinary: Positive for decreased urine volume, difficulty urinating and frequency. Negative for flank pain.  Musculoskeletal: Positive for back pain. Negative for arthralgias.  Neurological: Negative for weakness.  Objective:  BP (!) 156/64   Pulse 65   Temp 97.8 F (36.6 C)   Ht _0  (1.473 m)   Wt 135 lb 3.2 oz (61.3 kg)   SpO2 100%   BMI 28.26 kg/m   BP Readings from Last 3  Encounters:  07/16/20 (!) 156/64  06/04/20 (!) 168/72  05/31/20 (!) 158/82    Wt Readings from Last 3 Encounters:  07/16/20 135 lb 3.2 oz (61.3 kg)  06/04/20 134 lb (60.8 kg)  05/27/20 133 lb (60.3 kg)     Physical Exam Constitutional:      General: She is not in acute distress.    Appearance: She is well-developed.  HENT:     Head: Normocephalic and atraumatic.  Eyes:     Conjunctiva/sclera: Conjunctivae normal.     Pupils: Pupils are equal, round, and reactive to light.  Neck:     Thyroid: No thyromegaly.  Cardiovascular:     Rate and Rhythm: Normal rate and regular rhythm.     Heart sounds: Normal heart sounds. No murmur heard.   Pulmonary:     Effort: Pulmonary effort is normal. No respiratory distress.     Breath sounds: Normal breath sounds. No wheezing or rales.  Abdominal:     General: Bowel sounds are normal. There is no distension.     Palpations: Abdomen is soft.     Tenderness: There is no abdominal tenderness.  Musculoskeletal:        General: Normal range of motion.     Cervical back: Normal range of motion and neck supple.  Lymphadenopathy:     Cervical: No cervical adenopathy.  Skin:    General: Skin is warm and dry.  Neurological:     Mental Status: She is alert and oriented to person, place, and time.  Psychiatric:        Behavior: Behavior normal.        Thought Content: Thought content normal.        Judgment: Judgment normal.       Assessment & Plan:   Heidi Dorsey was seen today for hypertension.  Diagnoses and all orders for this visit:  DDD (degenerative disc disease), lumbar -     Cancel: Cyclic Citrul Peptide Antibody, IGG -     Sedimentation rate -     CYCLIC CITRUL PEPTIDE ANTIBODY, IGG/IGA  Essential hypertension -     CMP14+EGFR -     CBC with Differential/Platelet -     Lipid panel -     amLODipine (NORVASC) 2.5 MG tablet; Take 1 tablet (2.5 mg total) by mouth daily.  Urinary frequency -     mirabegron ER (MYRBETRIQ) 25  MG TB24 tablet; Take 1 tablet (25 mg total) by mouth daily. -     CMP14+EGFR -     CBC with Differential/Platelet  Vitamin D deficiency -     VITAMIN D 25 Hydroxy (Vit-D Deficiency, Fractures)  Other orders -     Discontinue: amLODipine (NORVASC) 5 MG tablet; Take 1 tablet (5 mg total) by mouth daily. For blood pressure       I have discontinued Heidi Dorsey's mupirocin ointment, metoprolol succinate, and amLODipine. I am also having her start on mirabegron ER and amLODipine. Additionally, I am having her maintain her Ascorbic Acid, Omega-3, Multi-Vitamin, and calcium carbonate.  Allergies as of 07/16/2020      Reactions   Calcitonin (salmon) Other (See Comments)   LEFT BUNDLE BRANCH BLOCK   Estradiol Other (See Comments)  Headache   Fexofenadine Other (See Comments)   Syncope   Miacalcin [calcitonin] Palpitations   Nsaids Other (See Comments)   Stomach ulcer after taking for 3 days in 2009. Negative H. Pylori test.   Diphenhydramine Other (See Comments)   Syncope   Clarithromycin Other (See Comments)   CAN TAKE AZITHROMYCIN   Codeine Other (See Comments)   Doxepin Hcl Other (See Comments)   Heart arrhythmia   Epinephrine Other (See Comments)   Erythromycin Other (See Comments)   Stomach irritation   Fluticasone Other (See Comments)   Fluticasone Propionate Other (See Comments)   And similar corticoid steroids   Ipratropium Bromide Other (See Comments)   Syncope   Lisinopril Other (See Comments)   Other Reaction: BRUISING, CANKERS & ARTHRALGIAS   Other Other (See Comments)   Paraphenylendiamine/propanol amine/anti-cholinergic compounds   Sulfa Antibiotics Other (See Comments)   Tetracycline Other (See Comments)   Mouth sores after just 2 doses. DOXYCYCLINE is OK per patient   Amoxicillin-pot Clavulanate Rash   Able to take plain amoxicillin   Ciprofloxacin Rash   Latex Rash   Nitrofurantoin Other (See Comments)   Multiple mild symptoms - would prefer not to take  again      Medication List       Accurate as of July 16, 2020  5:43 PM. If you have any questions, ask your nurse or doctor.        STOP taking these medications   metoprolol succinate 25 MG 24 hr tablet Commonly known as: Toprol XL Stopped by: Claretta Fraise, MD   mupirocin ointment 2 % Commonly known as: BACTROBAN Stopped by: Claretta Fraise, MD     TAKE these medications   amLODipine 2.5 MG tablet Commonly known as: NORVASC Take 1 tablet (2.5 mg total) by mouth daily. Started by: Claretta Fraise, MD   Ascorbic Acid 500 MG Caps Take by mouth.   calcium carbonate 500 MG chewable tablet Commonly known as: TUMS - dosed in mg elemental calcium Chew 1 tablet by mouth as needed for indigestion or heartburn.   mirabegron ER 25 MG Tb24 tablet Commonly known as: Myrbetriq Take 1 tablet (25 mg total) by mouth daily. Started by: Claretta Fraise, MD   Multi-Vitamin tablet Take by mouth.   Omega-3 1000 MG Caps Take by mouth.        Follow-up: Return in about 1 month (around 08/15/2020).  Claretta Fraise, M.D.

## 2020-07-17 LAB — CMP14+EGFR
ALT: 15 IU/L (ref 0–32)
AST: 17 IU/L (ref 0–40)
Albumin/Globulin Ratio: 2.4 — ABNORMAL HIGH (ref 1.2–2.2)
Albumin: 4.6 g/dL (ref 3.6–4.6)
Alkaline Phosphatase: 78 IU/L (ref 44–121)
BUN/Creatinine Ratio: 18 (ref 12–28)
BUN: 14 mg/dL (ref 8–27)
Bilirubin Total: 0.3 mg/dL (ref 0.0–1.2)
CO2: 22 mmol/L (ref 20–29)
Calcium: 9.7 mg/dL (ref 8.7–10.3)
Chloride: 100 mmol/L (ref 96–106)
Creatinine, Ser: 0.77 mg/dL (ref 0.57–1.00)
Globulin, Total: 1.9 g/dL (ref 1.5–4.5)
Glucose: 100 mg/dL — ABNORMAL HIGH (ref 65–99)
Potassium: 4.7 mmol/L (ref 3.5–5.2)
Sodium: 139 mmol/L (ref 134–144)
Total Protein: 6.5 g/dL (ref 6.0–8.5)
eGFR: 75 mL/min/{1.73_m2} (ref >59)

## 2020-07-17 LAB — CBC WITH DIFFERENTIAL/PLATELET
Basophils Absolute: 0.1 10*3/uL (ref 0.0–0.2)
Basos: 1 %
EOS (ABSOLUTE): 0 10*3/uL (ref 0.0–0.4)
Eos: 0 %
Hematocrit: 41.8 % (ref 34.0–46.6)
Hemoglobin: 13.5 g/dL (ref 11.1–15.9)
Immature Grans (Abs): 0 10*3/uL (ref 0.0–0.1)
Immature Granulocytes: 0 %
Lymphocytes Absolute: 0.9 10*3/uL (ref 0.7–3.1)
Lymphs: 17 %
MCH: 28.8 pg (ref 26.6–33.0)
MCHC: 32.3 g/dL (ref 31.5–35.7)
MCV: 89 fL (ref 79–97)
Monocytes Absolute: 0.5 10*3/uL (ref 0.1–0.9)
Monocytes: 9 %
Neutrophils Absolute: 4.1 10*3/uL (ref 1.4–7.0)
Neutrophils: 73 %
Platelets: 173 10*3/uL (ref 150–450)
RBC: 4.68 x10E6/uL (ref 3.77–5.28)
RDW: 14.1 % (ref 11.7–15.4)
WBC: 5.6 10*3/uL (ref 3.4–10.8)

## 2020-07-17 LAB — LIPID PANEL
Chol/HDL Ratio: 2.6 ratio (ref 0.0–4.4)
Cholesterol, Total: 230 mg/dL — ABNORMAL HIGH (ref 100–199)
HDL: 89 mg/dL (ref >39)
LDL Chol Calc (NIH): 119 mg/dL — ABNORMAL HIGH (ref 0–99)
Triglycerides: 131 mg/dL (ref 0–149)
VLDL Cholesterol Cal: 22 mg/dL (ref 5–40)

## 2020-07-17 LAB — VITAMIN D 25 HYDROXY (VIT D DEFICIENCY, FRACTURES): Vit D, 25-Hydroxy: 50.6 ng/mL (ref 30.0–100.0)

## 2020-07-17 LAB — SEDIMENTATION RATE: Sed Rate: 2 mm/hr (ref 0–40)

## 2020-07-18 LAB — CYCLIC CITRUL PEPTIDE ANTIBODY, IGG/IGA: Cyclic Citrullin Peptide Ab: <1 units (ref 0–19)

## 2020-08-06 ENCOUNTER — Ambulatory Visit: Payer: Medicare Other | Admitting: Cardiology

## 2020-08-08 ENCOUNTER — Other Ambulatory Visit: Payer: Self-pay

## 2020-08-08 ENCOUNTER — Encounter: Payer: Self-pay | Admitting: Family Medicine

## 2020-08-08 ENCOUNTER — Ambulatory Visit (INDEPENDENT_AMBULATORY_CARE_PROVIDER_SITE_OTHER): Payer: Medicare Other | Admitting: Family Medicine

## 2020-08-08 VITALS — BP 166/67 | HR 60 | Temp 97.6°F | Resp 20 | Ht <= 58 in | Wt 134.0 lb

## 2020-08-08 DIAGNOSIS — J3489 Other specified disorders of nose and nasal sinuses: Secondary | ICD-10-CM | POA: Diagnosis not present

## 2020-08-08 MED ORDER — AMOXICILLIN 875 MG PO TABS: 875.0000 mg | ORAL_TABLET | Freq: Two times a day (BID) | ORAL | 0 refills | Status: DC

## 2020-08-08 MED ORDER — AMOXICILLIN 875 MG PO TABS: 875.0000 mg | ORAL_TABLET | Freq: Two times a day (BID) | ORAL | 0 refills | Status: AC

## 2020-08-08 NOTE — Progress Notes (Signed)
Subjective:  Patient ID: Heidi Dorsey, female    DOB: 04/23/1930  Age: 87 y.o. MRN: [id number]  CC: nasal lesion    HPI Heidi Dorsey presents for desire to see ENT due to lesion in the nose. Present for years. Rash surrounding for 3 mos. Irritated. Red. Concerned or infection.  Depression screen [facility] 2/9 08/08/2020 07/16/2020 05/27/2020  Decreased Interest 0 0 0  Down, Depressed, Hopeless 0 0 0  PHQ - 2 Score 0 0 0    History Heidi Dorsey has a past medical history of Asthma, BPPV (benign paroxysmal positional vertigo), Claustrophobia, Diverticulosis, GERD (gastroesophageal reflux disease), H/O degenerative disc disease, Hiatal hernia, Hyperlipidemia, Hypertension, Kidney stones, Migraines, Osteoarthritis, Osteopenia, Prediabetes, Preglaucoma, Raynauds syndrome, Rectocele, Rosacea, Sciatica, Sleep apnea, Spinal stenosis, and Vertebrobasilar artery syndrome.   She has a past surgical history that includes Breast biopsy (Left, 12/1987); Breast biopsy (Right, 02/1977); Cardiac catheterization (2000); Hernia repair; Abdominal hysterectomy (1978); Anterior (cystocele) and posterior repair (rectocele) with xenform graft and sacrospinous fixation; Bladder suspension; plantar fibroma  (Left, 09/1997); Bilateral oophorectomy (1997); and bladder stones.   Her family history includes Bladder Cancer in her brother; Brain cancer in her father; Diabetes in her paternal grandmother; Osteoporosis in her mother and sister; Ovarian cancer in her mother; Rectal cancer in her brother; Skin cancer in her brother; Uterine cancer in her mother.She reports that she has never smoked. She has never used smokeless tobacco. She reports current alcohol use of about 2.0 standard drinks of alcohol per week. She reports that she does not use drugs.    ROS Review of Systems  Constitutional: Negative for activity change.  HENT: Negative for congestion, nosebleeds, postnasal drip, rhinorrhea and sore throat.     Objective:   BP (!) 166/67   Pulse 60   Temp 97.6 F (36.4 C)   Resp 20   Ht 4\' 10"  (1.473 m)   Wt 134 lb (60.8 kg)   SpO2 97%   BMI 28.01 kg/m   BP Readings from Last 3 Encounters:  08/08/20 (!) 166/67  07/16/20 (!) 156/64  06/04/20 (!) 168/72    Wt Readings from Last 3 Encounters:  08/08/20 134 lb (60.8 kg)  07/16/20 135 lb 3.2 oz (61.3 kg)  06/04/20 134 lb (60.8 kg)     Physical Exam Vitals reviewed.  Constitutional:      General: She is not in acute distress.    Appearance: Normal appearance.  HENT:     Head: Normocephalic and atraumatic.     Right Ear: Tympanic membrane normal.     Left Ear: Tympanic membrane normal.     Nose: No congestion.     Comments: There is a well-defined raised white nodule, 5 mm. Round and circumscribed at the nasal septum distallyjust inside the right nares. It is palpable as rubbery. Mildly tender. There is slight erythema at the adjacent philtrum.  Musculoskeletal:        General: Normal range of motion.  Neurological:     Mental Status: She is alert.       Assessment & Plan:   Heidi Dorsey was seen today for nasal lesion .  Diagnoses and all orders for this visit:  Internal nasal lesion -     Ambulatory referral to ENT  Other orders -     Discontinue: amoxicillin (AMOXIL) 875 MG tablet; Take 1 tablet (875 mg total) by mouth 2 (two) times daily for 10 days. -     amoxicillin (AMOXIL) 875 MG tablet; Take 1 tablet (875  mg total) by mouth 2 (two) times daily for 10 days.       I have discontinued Heidi Dorsey's amLODipine. I am also having her maintain her Ascorbic Acid, Omega-3, Multi-Vitamin, calcium carbonate, mirabegron ER, and amoxicillin.  Allergies as of 08/08/2020      Reactions   Calcitonin (salmon) Other (See Comments)   LEFT BUNDLE BRANCH BLOCK   Estradiol Other (See Comments)   Headache   Fexofenadine Other (See Comments)   Syncope   Miacalcin [calcitonin] Palpitations   Nsaids Other (See Comments)   Stomach ulcer  after taking for 3 days in 2009. Negative H. Pylori test.   Diphenhydramine Other (See Comments)   Syncope   Clarithromycin Other (See Comments)   CAN TAKE AZITHROMYCIN   Codeine Other (See Comments)   Doxepin Hcl Other (See Comments)   Heart arrhythmia   Epinephrine Other (See Comments)   Erythromycin Other (See Comments)   Stomach irritation   Fluticasone Other (See Comments)   Fluticasone Propionate Other (See Comments)   And similar corticoid steroids   Ipratropium Bromide Other (See Comments)   Syncope   Lisinopril Other (See Comments)   Other Reaction: BRUISING, CANKERS & ARTHRALGIAS   Other Other (See Comments)   Paraphenylendiamine/propanol amine/anti-cholinergic compounds   Sulfa Antibiotics Other (See Comments)   Tetracycline Other (See Comments)   Mouth sores after just 2 doses. DOXYCYCLINE is OK per patient   Amoxicillin-pot Clavulanate Rash   Able to take plain amoxicillin   Ciprofloxacin Rash   Latex Rash   Nitrofurantoin Other (See Comments)   Multiple mild symptoms - would prefer not to take again      Medication List       Accurate as of August 08, 2020 11:59 PM. If you have any questions, ask your nurse or doctor.        STOP taking these medications   amLODipine 2.5 MG tablet Commonly known as: NORVASC Stopped by: Mechele Claude, MD     TAKE these medications   amoxicillin 875 MG tablet Commonly known as: AMOXIL Take 1 tablet (875 mg total) by mouth 2 (two) times daily for 10 days. Started by: Mechele Claude, MD   Ascorbic Acid 500 MG Caps Take by mouth.   calcium carbonate 500 MG chewable tablet Commonly known as: TUMS - dosed in mg elemental calcium Chew 1 tablet by mouth as needed for indigestion or heartburn.   mirabegron ER 25 MG Tb24 tablet Commonly known as: Myrbetriq Take 1 tablet (25 mg total) by mouth daily.   Multi-Vitamin tablet Take by mouth.   Omega-3 1000 MG Caps Take by mouth.        Follow-up: No follow-ups on  file.  Mechele Claude, M.D.

## 2020-08-10 ENCOUNTER — Encounter: Payer: Self-pay | Admitting: Family Medicine

## 2020-08-18 NOTE — Progress Notes (Signed)
Cardiology Office Note   Date:  08/20/2020   ID:  [name], [name] 1930/10/25, MRN [id number]  PCP:  Heidi Claude, MD  Cardiologist:   None Referring:  Heidi Claude, MD  Chief Complaint  Patient presents with  . Hypertension      History of Present Illness: Heidi Dorsey is a 87 y.o. female who presents for evaluation of palpitations.    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  She was previously seen Dr. Cassie Dorsey.  She had 5 children but one of them, a [employer], was killed in the [employer] 20 years ago.  She had a long history of left bundle branch block.  She has had a cardiac catheterization in 2000.  She does not remember the results but was not told that she had vascular disease.  I see that she has had carotid Dopplers which were unremarkable.  She had a very mildly reduced ejection fraction on the stress echo in 2017 with her EF being 50%.  She has had a 24-hour Holter.  She previously has been treated with metoprolol.  Most recently she was on Cozaar 12.5 mg daily but this was discontinued because her blood pressures were running low.   She also called back and she was had an eye bleed on ARB so she did not want to start this.    She has also been intolerant of amlodipine.  She cannot take ACE inhibitors.  She denies any new cardiovascular symptoms.  However, she gives me an extensive blood pressure diary her blood pressure systolic still in the 180s.  She is not having any new palpitations, presyncope or syncope.  She is having nocturia.  She has lots of problems with her feet bony abnormalities.   Past Medical History:  Diagnosis Date  . Asthma   . BPPV (benign paroxysmal positional vertigo)   . Claustrophobia   . Diverticulosis   . GERD (gastroesophageal reflux disease)   . H/O degenerative disc disease   . Hiatal hernia   . Hyperlipidemia   . Hypertension   . Kidney stones   . Migraines   . Osteoarthritis    Caused feet  deformity  . Osteopenia   . Prediabetes   . Preglaucoma   . Raynauds syndrome   . Rectocele   . Rosacea   . Sciatica   . Sleep apnea   . Spinal stenosis   . Vertebrobasilar artery syndrome     Past Surgical History:  Procedure Laterality Date  . ABDOMINAL HYSTERECTOMY  1978  . ANTERIOR (CYSTOCELE) AND POSTERIOR REPAIR (RECTOCELE) WITH XENFORM GRAFT AND SACROSPINOUS FIXATION    . BILATERAL OOPHORECTOMY  1997  . bladder stones     kidney colic/ procedure performed in 1973 at [place]  . BLADDER SUSPENSION    . BREAST BIOPSY Left 12/1987   neg  . BREAST BIOPSY Right 02/1977   neg papilloma  . CARDIAC CATHETERIZATION  2000  . HERNIA REPAIR    . plantar fibroma  Left 09/1997   Excised     Current Outpatient Medications  Medication Sig Dispense Refill  . Ascorbic Acid 500 MG CAPS Take by mouth.    . calcium carbonate (TUMS - DOSED IN MG ELEMENTAL CALCIUM) 500 MG chewable tablet Chew 1 tablet by mouth as needed for indigestion or heartburn.    . hydrALAZINE (APRESOLINE) 10 MG tablet Take 1 tablet (10 mg total) by mouth as needed. Take 1 tab every 8 hours  as needed 90 tablet 1  . metoprolol succinate (TOPROL-XL) 50 MG 24 hr tablet Take 1 tablet (50 mg total) by mouth daily. Take with or immediately following a meal. 90 tablet 3  . Multiple Vitamin (MULTI-VITAMIN) tablet Take by mouth.    . Omega-3 1000 MG CAPS Take by mouth.    . mirabegron ER (MYRBETRIQ) 25 MG TB24 tablet Take 1 tablet (25 mg total) by mouth daily. (Patient not taking: Reported on 08/20/2020) 30 tablet 2   No current facility-administered medications for this visit.    Allergies:   Calcitonin (salmon), Estradiol, Fexofenadine, Miacalcin [calcitonin], Nsaids, Diphenhydramine, Clarithromycin, Codeine, Doxepin hcl, Epinephrine, Erythromycin, Fluticasone, Fluticasone propionate, Ipratropium bromide, Lisinopril, Other, Sulfa antibiotics, Tetracycline, Amoxicillin-pot clavulanate, Ciprofloxacin, Latex, and Nitrofurantoin     ROS:  Please see the history of present illness.   Otherwise, review of systems are positive for none.   All other systems are reviewed and negative.    PHYSICAL EXAM: VS:  BP (!) 158/72   Pulse 68   Ht 4\' 10"  (1.473 m)   Wt 133 lb (60.3 kg)   BMI 27.80 kg/m  , BMI Body mass index is 27.8 kg/m. GENERAL:  Well appearing NECK:  No jugular venous distention, waveform within normal limits, carotid upstroke brisk and symmetric, no bruits, no thyromegaly LUNGS:  Clear to auscultation bilaterally CHEST:  Unremarkable HEART:  PMI not displaced or sustained,S1 and S2 within normal limits, no S3, no S4, no clicks, no rubs, brief soft apical and left upper sternal border systolic murmur, no diastolic murmurs ABD:  Flat, positive bowel sounds normal in frequency in pitch, no bruits, no rebound, no guarding, no midline pulsatile mass, no hepatomegaly, no splenomegaly EXT:  2 plus pulses throughout, no edema, no cyanosis no clubbing   EKG:  EKG is not ordered today.   Recent Labs: 05/14/2020: TSH 2.650 07/16/2020: ALT 15; BUN 14; Creatinine, Ser 0.77; Hemoglobin 13.5; Platelets 173; Potassium 4.7; Sodium 139    Lipid Panel    Component Value Date/Time   CHOL 230 (H) 07/16/2020 1227   TRIG 131 07/16/2020 1227   HDL 89 07/16/2020 1227   CHOLHDL 2.6 07/16/2020 1227   LDLCALC 119 (H) 07/16/2020 1227      Wt Readings from Last 3 Encounters:  08/20/20 133 lb (60.3 kg)  08/08/20 134 lb (60.8 kg)  07/16/20 135 lb 3.2 oz (61.3 kg)      Other studies Reviewed: Additional studies/ records that were reviewed today include: None Review of the above records demonstrates:  Please see elsewhere in the note.     ASSESSMENT AND PLAN:  PALPITATIONS:      I do not know where that time she not particular bothered by these.  No change in therapy.  HTN:       Her blood pressure is still not at target.  She is intolerant of several meds as above.  She tolerates the beta-blocker so I am going to go  up to 50 mg daily.  I also suggested 10 mg of hydralazine to be taken as needed blood pressure 180 and we can work through whether she eventually needs to have this prescribed.  LBBB:   She has had an extensive work-up.  No further work-up.   Current medicines are reviewed at length with the patient today.  The patient does not have concerns regarding medicines.  The following changes have been made: As above  Labs/ tests ordered today include: None  No orders of the defined  types were placed in this encounter.    Disposition:   FU with 3 months   Signed, Rollene Rotunda, MD  08/20/2020 11:44 AM    [facility]

## 2020-08-19 DIAGNOSIS — I447 Left bundle-branch block, unspecified: Secondary | ICD-10-CM | POA: Insufficient documentation

## 2020-08-20 ENCOUNTER — Encounter: Payer: Self-pay | Admitting: Cardiology

## 2020-08-20 ENCOUNTER — Ambulatory Visit (INDEPENDENT_AMBULATORY_CARE_PROVIDER_SITE_OTHER): Payer: Medicare Other | Admitting: Cardiology

## 2020-08-20 ENCOUNTER — Other Ambulatory Visit: Payer: Self-pay

## 2020-08-20 VITALS — BP 158/72 | HR 68 | Ht <= 58 in | Wt 133.0 lb

## 2020-08-20 DIAGNOSIS — I10 Essential (primary) hypertension: Secondary | ICD-10-CM | POA: Diagnosis not present

## 2020-08-20 DIAGNOSIS — R002 Palpitations: Secondary | ICD-10-CM | POA: Diagnosis not present

## 2020-08-20 DIAGNOSIS — I447 Left bundle-branch block, unspecified: Secondary | ICD-10-CM | POA: Diagnosis not present

## 2020-08-20 MED ORDER — METOPROLOL SUCCINATE ER 50 MG PO TB24: 50.0000 mg | ORAL_TABLET | Freq: Every day | ORAL | 3 refills | Status: DC

## 2020-08-20 MED ORDER — HYDRALAZINE HCL 10 MG PO TABS
10.0000 mg | ORAL_TABLET | ORAL | 1 refills | Status: DC | PRN
Start: 2020-08-20 — End: 2021-06-30

## 2020-08-20 NOTE — Patient Instructions (Signed)
Medication Instructions:  Please increase your Metoprolol Succinate to 50 mg daily. You may take Hydralazine 10 mg once every 8 hours as needed. Continue all other medications as listed.  *If you need a refill on your cardiac medications before your next appointment, please call your pharmacy*  Follow-Up: At [facility], you and your health needs are our priority.  As part of our continuing mission to provide you with exceptional heart care, we have created designated Provider Care Teams.  These Care Teams include your primary Cardiologist (physician) and Advanced Practice Providers (APPs -  Physician Assistants and Nurse Practitioners) who all work together to provide you with the care you need, when you need it.  We recommend signing up for the patient portal called "MyChart".  Sign up information is provided on this After Visit Summary.  MyChart is used to connect with patients for Virtual Visits (Telemedicine).  Patients are able to view lab/test results, encounter notes, upcoming appointments, etc.  Non-urgent messages can be sent to your provider as well.   To learn more about what you can do with MyChart, go to [email or web address].    Your next appointment:   3 month(s)  The format for your next appointment:   In Person  Provider:   Rollene Rotunda, MD   Thank you for choosing [facility]!!

## 2020-09-12 ENCOUNTER — Telehealth: Payer: Self-pay | Admitting: Family Medicine

## 2020-09-12 ENCOUNTER — Telehealth: Payer: Self-pay | Admitting: Cardiology

## 2020-09-12 NOTE — Telephone Encounter (Signed)
Pt c/o BP issue: STAT if pt c/o blurred vision, one-sided weakness or slurred speech  1. What are your last 5 BP readings?  05/30: 178/70 53 05/31: 159/66 53,172 (systolic only) 06/01: 142/60, 143/88 06/02: 147/64, 152/66  2. Are you having any other symptoms (ex. Dizziness, headache, blurred vision, passed out)? Shakes   3. What is your BP issue?  Patient states her BP has been elevated, assumes her Metoprolol is not working. She states she takes 1 50 MG tablet daily .

## 2020-09-12 NOTE — Telephone Encounter (Signed)
Pt state despite increase in metoprolol, she continues to experience  increased BP. Pt state she doesn't know if it could be related to not being able to sleep due to spinal issues.  Will forward to MD for review.    05/30: 178/70  53 05/31: 159/66  53,172 (systolic only) 06/01: 142/60, 244/97 06/02: 147/64, 152/66

## 2020-09-14 NOTE — Telephone Encounter (Signed)
Has she been taking the hydralazine as needed.  She is very intolerant of many meds so I do not have many choices.  We were going to use hydralazine 10 mg prn BP 160 and if she is taking that frequently then we can start 10 bid.

## 2020-09-15 ENCOUNTER — Ambulatory Visit (INDEPENDENT_AMBULATORY_CARE_PROVIDER_SITE_OTHER): Payer: Medicare Other

## 2020-09-15 VITALS — BP 168/63 | HR 55 | Ht <= 58 in | Wt 132.0 lb

## 2020-09-15 DIAGNOSIS — T7840XA Allergy, unspecified, initial encounter: Secondary | ICD-10-CM | POA: Insufficient documentation

## 2020-09-15 DIAGNOSIS — L719 Rosacea, unspecified: Secondary | ICD-10-CM | POA: Insufficient documentation

## 2020-09-15 DIAGNOSIS — J45909 Unspecified asthma, uncomplicated: Secondary | ICD-10-CM | POA: Insufficient documentation

## 2020-09-15 DIAGNOSIS — H259 Unspecified age-related cataract: Secondary | ICD-10-CM | POA: Insufficient documentation

## 2020-09-15 DIAGNOSIS — Z604 Social exclusion and rejection: Secondary | ICD-10-CM | POA: Diagnosis not present

## 2020-09-15 DIAGNOSIS — H40009 Preglaucoma, unspecified, unspecified eye: Secondary | ICD-10-CM | POA: Insufficient documentation

## 2020-09-15 DIAGNOSIS — L853 Xerosis cutis: Secondary | ICD-10-CM | POA: Insufficient documentation

## 2020-09-15 DIAGNOSIS — Z599 Problem related to housing and economic circumstances, unspecified: Secondary | ICD-10-CM | POA: Diagnosis not present

## 2020-09-15 DIAGNOSIS — Z9229 Personal history of other drug therapy: Secondary | ICD-10-CM | POA: Insufficient documentation

## 2020-09-15 DIAGNOSIS — H538 Other visual disturbances: Secondary | ICD-10-CM

## 2020-09-15 DIAGNOSIS — Z8719 Personal history of other diseases of the digestive system: Secondary | ICD-10-CM | POA: Insufficient documentation

## 2020-09-15 DIAGNOSIS — I10 Essential (primary) hypertension: Secondary | ICD-10-CM | POA: Diagnosis not present

## 2020-09-15 DIAGNOSIS — J4520 Mild intermittent asthma, uncomplicated: Secondary | ICD-10-CM

## 2020-09-15 DIAGNOSIS — Z0001 Encounter for general adult medical examination with abnormal findings: Secondary | ICD-10-CM | POA: Diagnosis not present

## 2020-09-15 DIAGNOSIS — Z0000 Encounter for general adult medical examination without abnormal findings: Secondary | ICD-10-CM

## 2020-09-15 NOTE — Telephone Encounter (Signed)
It will not affect her asthma and she should take it as we discussed in the office.  She does not have many options with meds she would tolerate and this is the gentlest of her options.

## 2020-09-15 NOTE — Telephone Encounter (Signed)
Spoke with pt, she has not taken the hydralazine because it clearly states it is not for patients with asthma and she has bad asthma and coughs a lot during the day already. Aware will forward to dr hochrein for his recommendations.

## 2020-09-15 NOTE — Telephone Encounter (Signed)
Spoke with pt, aware of dr hochrein's recommendations. ?

## 2020-09-15 NOTE — Patient Instructions (Signed)
Heidi Dorsey , Thank you for taking time to come for your Medicare Wellness Visit. I appreciate your ongoing commitment to your health goals. Please review the following plan we discussed and let me know if I can assist you in the future.   Screening recommendations/referrals: Colonoscopy: No longer required Mammogram: Done 07/09/20 - Repeat annually Bone Density: Declined  Recommended yearly ophthalmology/optometry visit for glaucoma screening and checkup Recommended yearly dental visit for hygiene and checkup  Vaccinations: Influenza vaccine: Done 01/04/2020 - Repeat annually Pneumococcal vaccine: Done 02/17/1999 & 11/28/2013 Tdap vaccine: Done 11/23/2012 - Repeat in 10 years Shingles vaccine: Due   Covid-19: Done 05/16/2019, 06/06/2019, & 01/16/2020  Advanced directives: Please bring a copy of your health care power of attorney and living will to the office to be added to your chart at your convenience.  Conditions/risks identified: Aim for 30 minutes of exercise or brisk walking each day, drink 6-8 glasses of water and eat lots of fruits and vegetables.  Next appointment: Follow up in one year for your annual wellness visit    Preventive Care 65 Years and Older, Female Preventive care refers to lifestyle choices and visits with your health care provider that can promote health and wellness. What does preventive care include?  A yearly physical exam. This is also called an annual well check.  Dental exams once or twice a year.  Routine eye exams. Ask your health care provider how often you should have your eyes checked.  Personal lifestyle choices, including:  Daily care of your teeth and gums.  Regular physical activity.  Eating a healthy diet.  Avoiding tobacco and drug use.  Limiting alcohol use.  Practicing safe sex.  Taking low-dose aspirin every day.  Taking vitamin and mineral supplements as recommended by your health care provider. What happens during an annual well  check? The services and screenings done by your health care provider during your annual well check will depend on your age, overall health, lifestyle risk factors, and family history of disease. Counseling  Your health care provider may ask you questions about your:  Alcohol use.  Tobacco use.  Drug use.  Emotional well-being.  Home and relationship well-being.  Sexual activity.  Eating habits.  History of falls.  Memory and ability to understand (cognition).  Work and work Astronomer.  Reproductive health. Screening  You may have the following tests or measurements:  Height, weight, and BMI.  Blood pressure.  Lipid and cholesterol levels. These may be checked every 5 years, or more frequently if you are over 45 years old.  Skin check.  Lung cancer screening. You may have this screening every year starting at age 20 if you have a 30-pack-year history of smoking and currently smoke or have quit within the past 15 years.  Fecal occult blood test (FOBT) of the stool. You may have this test every year starting at age 34.  Flexible sigmoidoscopy or colonoscopy. You may have a sigmoidoscopy every 5 years or a colonoscopy every 10 years starting at age 51.  Hepatitis C blood test.  Hepatitis B blood test.  Sexually transmitted disease (STD) testing.  Diabetes screening. This is done by checking your blood sugar (glucose) after you have not eaten for a while (fasting). You may have this done every 1-3 years.  Bone density scan. This is done to screen for osteoporosis. You may have this done starting at age 26.  Mammogram. This may be done every 1-2 years. Talk to your health care provider  about how often you should have regular mammograms. Talk with your health care provider about your test results, treatment options, and if necessary, the need for more tests. Vaccines  Your health care provider may recommend certain vaccines, such as:  Influenza vaccine. This is  recommended every year.  Tetanus, diphtheria, and acellular pertussis (Tdap, Td) vaccine. You may need a Td booster every 10 years.  Zoster vaccine. You may need this after age 66.  Pneumococcal 13-valent conjugate (PCV13) vaccine. One dose is recommended after age 6.  Pneumococcal polysaccharide (PPSV23) vaccine. One dose is recommended after age 69. Talk to your health care provider about which screenings and vaccines you need and how often you need them. This information is not intended to replace advice given to you by your health care provider. Make sure you discuss any questions you have with your health care provider. Document Released: 04/25/2015 Document Revised: 12/17/2015 Document Reviewed: 01/28/2015 Elsevier Interactive Patient Education  2017 [place] Prevention in the Home Falls can cause injuries. They can happen to people of all ages. There are many things you can do to make your home safe and to help prevent falls. What can I do on the outside of my home?  Regularly fix the edges of walkways and driveways and fix any cracks.  Remove anything that might make you trip as you walk through a door, such as a raised step or threshold.  Trim any bushes or trees on the path to your home.  Use bright outdoor lighting.  Clear any walking paths of anything that might make someone trip, such as rocks or tools.  Regularly check to see if handrails are loose or broken. Make sure that both sides of any steps have handrails.  Any raised decks and porches should have guardrails on the edges.  Have any leaves, snow, or ice cleared regularly.  Use sand or salt on walking paths during winter.  Clean up any spills in your garage right away. This includes oil or grease spills. What can I do in the bathroom?  Use night lights.  Install grab bars by the toilet and in the tub and shower. Do not use towel bars as grab bars.  Use non-skid mats or decals in the tub or  shower.  If you need to sit down in the shower, use a plastic, non-slip stool.  Keep the floor dry. Clean up any water that spills on the floor as soon as it happens.  Remove soap buildup in the tub or shower regularly.  Attach bath mats securely with double-sided non-slip rug tape.  Do not have throw rugs and other things on the floor that can make you trip. What can I do in the bedroom?  Use night lights.  Make sure that you have a light by your bed that is easy to reach.  Do not use any sheets or blankets that are too big for your bed. They should not hang down onto the floor.  Have a firm chair that has side arms. You can use this for support while you get dressed.  Do not have throw rugs and other things on the floor that can make you trip. What can I do in the kitchen?  Clean up any spills right away.  Avoid walking on wet floors.  Keep items that you use a lot in easy-to-reach places.  If you need to reach something above you, use a strong step stool that has a grab bar.  Keep electrical cords out of the way.  Do not use floor polish or wax that makes floors slippery. If you must use wax, use non-skid floor wax.  Do not have throw rugs and other things on the floor that can make you trip. What can I do with my stairs?  Do not leave any items on the stairs.  Make sure that there are handrails on both sides of the stairs and use them. Fix handrails that are broken or loose. Make sure that handrails are as long as the stairways.  Check any carpeting to make sure that it is firmly attached to the stairs. Fix any carpet that is loose or worn.  Avoid having throw rugs at the top or bottom of the stairs. If you do have throw rugs, attach them to the floor with carpet tape.  Make sure that you have a light switch at the top of the stairs and the bottom of the stairs. If you do not have them, ask someone to add them for you. What else can I do to help prevent  falls?  Wear shoes that:  Do not have high heels.  Have rubber bottoms.  Are comfortable and fit you well.  Are closed at the toe. Do not wear sandals.  If you use a stepladder:  Make sure that it is fully opened. Do not climb a closed stepladder.  Make sure that both sides of the stepladder are locked into place.  Ask someone to hold it for you, if possible.  Clearly mark and make sure that you can see:  Any grab bars or handrails.  First and last steps.  Where the edge of each step is.  Use tools that help you move around (mobility aids) if they are needed. These include:  Canes.  Walkers.  Scooters.  Crutches.  Turn on the lights when you go into a dark area. Replace any light bulbs as soon as they burn out.  Set up your furniture so you have a clear path. Avoid moving your furniture around.  If any of your floors are uneven, fix them.  If there are any pets around you, be aware of where they are.  Review your medicines with your doctor. Some medicines can make you feel dizzy. This can increase your chance of falling. Ask your doctor what other things that you can do to help prevent falls. This information is not intended to replace advice given to you by your health care provider. Make sure you discuss any questions you have with your health care provider. Document Released: 01/23/2009 Document Revised: 09/04/2015 Document Reviewed: 05/03/2014 Elsevier Interactive Patient Education  2017 [employer].

## 2020-09-15 NOTE — Progress Notes (Signed)
Subjective:   Heidi Dorsey is a 87 y.o. female who presents for Medicare Annual (Subsequent) preventive examination.   Virtual Visit via Telephone Note  I connected with  Heidi Dorsey on 09/15/20 at  2:00 PM EDT by telephone and verified that I am speaking with the correct person using two identifiers.  Location: Patient: Home Provider: [facility] Persons participating in the virtual visit: patient/Nurse Health Advisor   I discussed the limitations, risks, security and privacy concerns of performing an evaluation and management service by telephone and the availability of in person appointments. The patient expressed understanding and agreed to proceed.  Interactive audio and video telecommunications were attempted between this nurse and patient, however failed, due to patient having technical difficulties OR patient did not have access to video capability.  We continued and completed visit with audio only.  Some vital signs may be absent or patient reported.   Patricie Geeslin E Ellese Julius, LPN   Review of Systems     Cardiac Risk Factors include: advanced age (>84men, >103 women);hypertension;dyslipidemia     Objective:    Today's Vitals   09/15/20 1408  BP: (!) 168/63  Pulse: (!) 55  Weight: 132 lb (59.9 kg)  Height:  (1.473 m)   Body mass index is 27.59 kg/m.  Advanced Directives 09/15/2020 02/29/2020  Does Patient Have a Medical Advance Directive? Yes Yes  Type of Estate agent of [place];Living will -  Copy of Healthcare Power of Attorney in Chart? No - copy requested -    Current Medications (verified) Outpatient Encounter Medications as of 09/15/2020  Medication Sig  . Multiple Vitamin (MULTI-VITAMIN) tablet Take by mouth.  . Ascorbic Acid 500 MG CAPS Take by mouth. (Patient not taking: Reported on 09/15/2020)  . calcium carbonate (TUMS - DOSED IN MG ELEMENTAL CALCIUM) 500 MG chewable tablet Chew 1 tablet by mouth as needed for indigestion or heartburn.  (Patient not taking: Reported on 09/15/2020)  . hydrALAZINE (APRESOLINE) 10 MG tablet Take 1 tablet (10 mg total) by mouth as needed. Take 1 tab every 8 hours as needed (Patient not taking: Reported on 09/15/2020)  . metoprolol succinate (TOPROL-XL) 50 MG 24 hr tablet Take 1 tablet (50 mg total) by mouth daily. Take with or immediately following a meal. (Patient not taking: Reported on 09/15/2020)  . mirabegron ER (MYRBETRIQ) 25 MG TB24 tablet Take 1 tablet (25 mg total) by mouth daily. (Patient not taking: Reported on 09/15/2020)  . Omega-3 1000 MG CAPS Take by mouth. (Patient not taking: Reported on 09/15/2020)   No facility-administered encounter medications on file as of 09/15/2020.    Allergies (verified) Calcitonin (salmon), Estradiol, Fexofenadine, Miacalcin [calcitonin], Nsaids, Diphenhydramine, Clarithromycin, Codeine, Doxepin hcl, Epinephrine, Erythromycin, Fluticasone, Fluticasone propionate, Ipratropium bromide, Lisinopril, Other, Sulfa antibiotics, Tetracycline, Amoxicillin-pot clavulanate, Ciprofloxacin, Latex, and Nitrofurantoin   History: Past Medical History:  Diagnosis Date  . Asthma   . BPPV (benign paroxysmal positional vertigo)   . Claustrophobia   . Diverticulosis   . GERD (gastroesophageal reflux disease)   . H/O degenerative disc disease   . Hiatal hernia   . Hyperlipidemia   . Hypertension   . Kidney stones   . Migraines   . Osteoarthritis    Caused feet deformity  . Osteopenia   . Prediabetes   . Preglaucoma   . Raynauds syndrome   . Rectocele   . Rosacea   . Sciatica   . Sleep apnea   . Spinal stenosis   . Vertebrobasilar artery syndrome  Past Surgical History:  Procedure Laterality Date  . ABDOMINAL HYSTERECTOMY  1978  . ANTERIOR (CYSTOCELE) AND POSTERIOR REPAIR (RECTOCELE) WITH XENFORM GRAFT AND SACROSPINOUS FIXATION    . BILATERAL OOPHORECTOMY  1997  . bladder stones     kidney colic/ procedure performed in 1973 at [facility]  . BLADDER SUSPENSION    .  BREAST BIOPSY Left 12/1987   neg  . BREAST BIOPSY Right 02/1977   neg papilloma  . CARDIAC CATHETERIZATION  2000  . HERNIA REPAIR    . plantar fibroma  Left 09/1997   Excised   Family History  Problem Relation Age of Onset  . Osteoporosis Mother   . Ovarian cancer Mother   . Uterine cancer Mother   . Brain cancer Father   . Bladder Cancer Brother   . Rectal cancer Brother   . Skin cancer Brother   . Osteoporosis Sister   . Diabetes Paternal Grandmother   . Breast cancer Neg Hx    Social History   Socioeconomic History  . Marital status: Single    Spouse name: Not on file  . Number of children: Not on file  . Years of education: Not on file  . Highest education level: Not on file  Occupational History  . Occupation: Retired    Comment: Scientist, product/process development  Tobacco Use  . Smoking status: Never Smoker  . Smokeless tobacco: Never Used  Vaping Use  . Vaping Use: Never used  Substance and Sexual Activity  . Alcohol use: Yes    Alcohol/week: 2.0 standard drinks    Types: 2 Glasses of wine per week    Comment: per week  . Drug use: Never  . Sexual activity: Not Currently    Partners: Male    Birth control/protection: Surgical  Other Topics Concern  . Not on file  Social History Narrative   Right Handed   Lives in an apartment complex and her apartment is on ground level.    Drinks Half Dec[employer] Coffee and Tea   Originally from [place], then lived in [place] most of her life - moved here 09/2019 - feels out of place, having trouble finding others with similar interests   She is agnostic - not religious (although raised Catholic) - strong scientific beliefs    Social Determinants of Health   Financial Resource Strain: Medium Risk  . Difficulty of Paying Living Expenses: Somewhat hard  Food Insecurity: No Food Insecurity  . Worried About Programme researcher, broadcasting/film/video in the Last Year: Never true  . Ran Out of Food in the Last Year: Never true  Transportation Needs: No  Transportation Needs  . Lack of Transportation (Medical): No  . Lack of Transportation (Non-Medical): No  Physical Activity: Insufficiently Active  . Days of Exercise per Week: 7 days  . Minutes of Exercise per Session: 20 min  Stress: No Stress Concern Present  . Feeling of Stress : Not at all  Social Connections: Socially Isolated  . Frequency of Communication with Friends and Family: More than three times a week  . Frequency of Social Gatherings with Friends and Family: Once a week  . Attends Religious Services: Never  . Active Member of Clubs or Organizations: No  . Attends Banker Meetings: Never  . Marital Status: Divorced    Tobacco Counseling Counseling given: Not Answered   Clinical Intake:  Pre-visit preparation completed: Yes  Pain : No/denies pain     BMI - recorded: 27.59 Nutritional Status: BMI  25 -29 Overweight Nutritional Risks: None Diabetes: No  How often do you need to have someone help you when you read instructions, pamphlets, or other written materials from your doctor or pharmacy?: 1 - Never  Diabetic? No  Interpreter Needed?: No  Information entered by :: Shailah Gibbins, LPN   Activities of Daily Living In your present state of health, do you have any difficulty performing the following activities: 09/15/2020  Hearing? N  Vision? Y  Difficulty concentrating or making decisions? N  Walking or climbing stairs? N  Dressing or bathing? N  Doing errands, shopping? N  Preparing Food and eating ? N  Using the Toilet? N  In the past six months, have you accidently leaked urine? Y  Comment couldn't afford Myrbetriq  Do you have problems with loss of bowel control? N  Managing your Medications? N  Managing your Finances? N  Housekeeping or managing your Housekeeping? N  Some recent data might be hidden    Patient Care Team: Mechele Claude, MD as PCP - General (Family Medicine) Glendale Chard, DO as Consulting Physician  (Neurology)  Indicate any recent Medical Services you may have received from other than [facility] providers in the past year (date may be approximate).     Assessment:   This is a routine wellness examination for Heidi Dorsey.  Hearing/Vision screen  Hearing Screening   125Hz  250Hz  500Hz  1000Hz  2000Hz  3000Hz  4000Hz  6000Hz  8000Hz   Right ear:           Left ear:           Comments: Denies hearing difficulties   Vision Screening Comments: C/o blurred vision - knows she need new eyeglasses - [facility] app[facility] exam  Dietary issues and exercise activities discussed: Current Exercise Habits: Home exercise routine, Type of exercise: walking, Time (Minutes): 20, Frequency (Times/Week): 7, Weekly Exercise (Minutes/Week): 140, Intensity: Mild, Exercise limited by: orthopedic condition(s);respiratory conditions(s)  Goals Addressed            This Visit's Progress   . Patient Stated       She wants help applying for Medicaid and/or getting assistance with costs of prescription eyeglasses and medications.  Also, hopes to meet others with similar interests as her.      Depression Screen PHQ 2/9 Scores 09/15/2020 08/08/2020 07/16/2020 05/27/2020 05/14/2020 12/18/2019  PHQ - 2 Score 2 0 0 0 0 0  PHQ- 9 Score 6 - - - - -    Fall Risk Fall Risk  09/15/2020 08/08/2020 07/16/2020 05/27/2020 05/14/2020  Falls in the past year? 0 0 0 0 0  Number falls in past yr: 0 - - - -  Injury with Fall? 0 - - - -  Risk for fall due to : Impaired vision;Orthopedic patient - - - -  Follow up Falls prevention discussed - - - -    FALL RISK PREVENTION PERTAINING TO THE HOME:  Any stairs in or around the home? No  If so, are there any without handrails? No  Home free of loose throw rugs in walkways, pet beds, electrical cords, etc? Yes  Adequate lighting in your home to reduce risk of falls? Yes   ASSISTIVE DEVICES UTILIZED TO PREVENT FALLS:  Life alert? Yes  Use of a cane, walker or w/c? No  Grab bars in the bathroom?  Yes  Shower chair or bench in shower? Yes  Elevated toilet seat or a handicapped toilet? Yes   TIMED UP AND GO:  Was  the test performed? No . Telephonic visit.  Cognitive Function: Normal cognitive status assessed by direct observation by this Nurse Health Advisor. No abnormalities found.      6CIT Screen 09/15/2020  What Year? 0 points  What month? 0 points  What time? 0 points  Count back from 20 0 points  Months in reverse 0 points  Repeat phrase 0 points  Total Score 0    Immunizations Immunization History  Administered Date(s) Administered  . Fluad Quad(high Dose 65+) 01/04/2020  . Influenza, High Dose Seasonal PF 01/11/2017, 12/19/2018  . Influenza,inj,Quad PF,6+ Mos 01/23/2014, 01/21/2015  . Influenza-Unspecified 01/14/2011, 01/20/2012, 12/28/2012, 01/23/2014, 12/24/2015, 01/11/2017, 12/23/2017  . PFIZER(Purple Top)SARS-COV-2 Vaccination 05/16/2019, 06/06/2019, 01/16/2020  . Pneumococcal Conjugate-13 11/28/2013  . Pneumococcal Polysaccharide-23 02/17/1999  . Td 02/22/2003  . Tdap 11/23/2012  . Zoster, Live 04/12/2006    TDAP status: Up to date  Flu Vaccine status: Up to date  Pneumococcal vaccine status: Up to date  Covid-19 vaccine status: Completed vaccines  Qualifies for Shingles Vaccine? Yes   Zostavax completed Yes   Shingrix Completed?: No.    Education has been provided regarding the importance of this vaccine. Patient has been advised to call insurance company to determine out of pocket expense if they have not yet received this vaccine. Advised may also receive vaccine at local pharmacy or Health Dept. Verbalized acceptance and understanding.  Screening Tests Health Maintenance  Topic Date Due  . Zoster Vaccines- Shingrix (1 of 2) Never done  . Pneumococcal Vaccine 420-85 Years old (1 of 2 - PPSV23) Never done  . INFLUENZA VACCINE  11/10/2020  . MAMMOGRAM  07/09/2021  . TETANUS/TDAP  11/24/2022  . DEXA SCAN  Completed  . COVID-19 Vaccine  Completed   . PNA vac Low Risk Adult  Completed  . HPV VACCINES  Aged Out    Health Maintenance  Health Maintenance Due  Topic Date Due  . Zoster Vaccines- Shingrix (1 of 2) Never done  . Pneumococcal Vaccine 760-85 Years old (1 of 2 - PPSV23) Never done    Colorectal cancer screening: No longer required.   Mammogram status: Completed 07/09/2020. Repeat every year  Bone Density Status: declined  Lung Cancer Screening: (Low Dose CT Chest recommended if Age 63-80 years, 30 pack-year currently smoking OR have quit w/in 15years.) does not qualify.   Additional Screening:  Hepatitis C Screening: does not qualify  Vision Screening: Recommended annual ophthalmology exams for early detection of glaucoma and other disorders of the eye. Is the patient up to date with their annual eye exam?  Yes  Who is the provider or what is the name of the office in which the patient attends annual eye exams? [facility] If pt is not established with a provider, would they like to be referred to a provider to establish care? No .   Dental Screening: Recommended annual dental exams for proper oral hygiene  Community Resource Referral / Chronic Care Management: CRR required this visit?  Yes   CCM required this visit?  No      Plan:     I have personally reviewed and noted the following in the patient's chart:   . Medical and social history . Use of alcohol, tobacco or illicit drugs  . Current medications and supplements including opioid prescriptions.  . Functional ability and status . Nutritional status . Physical activity . Advanced directives . List of other physicians . Hospitalizations, surgeries, and ER visits in previous 12 months . Vitals .  Screenings to include cognitive, depression, and falls . Referrals and appointments  In addition, I have reviewed and discussed with patient certain preventive protocols, quality metrics, and best practice recommendations. A written personalized care plan for  preventive services as well as general preventive health recommendations were provided to patient.     [place] Constable, LPN   04/18/107   Nurse Notes: CRR for financial strain with medication and prescription eyeglasses, as well as social isolation problems.

## 2020-09-16 ENCOUNTER — Telehealth: Payer: Self-pay | Admitting: Family Medicine

## 2020-09-16 NOTE — Telephone Encounter (Signed)
   Telephone encounter was:  Unsuccessful.  09/16/2020 Name: Shera Laubach MRN: [id number] DOB: [birth year]  Unsuccessful outbound call made today to assist with:  Financial Difficulties related to medications and prescription glasses and finding social activities in her community  Outreach Attempt:  1st Attempt  A HIPAA compliant voice message was left requesting a return call.  Instructed patient to call back at [phone].  April Green Care Guide, Embedded Care Coordination [facility], Care Management Phone: [phone] Email: [email or web address]

## 2020-09-16 NOTE — Telephone Encounter (Signed)
Please advise if this is okay with you

## 2020-09-16 NOTE — Telephone Encounter (Signed)
Sounds good

## 2020-09-17 ENCOUNTER — Telehealth: Payer: Self-pay | Admitting: Family Medicine

## 2020-09-17 NOTE — Telephone Encounter (Signed)
   Telephone encounter was:  Successful.  09/17/2020 Name: Heidi Dorsey MRN: [id number] DOB: 1930/12/15  [name] is a 87 y.o. year old female who is a primary care patient of Stacks, Broadus John, MD . The community resource team was consulted for assistance with Financial Difficulties related to medication and RX glasses.  Care guide performed the following interventions: Patient provided with information about care guide support team and interviewed to confirm resource needs Discussed resources to assist with grandson about medication and RX glasses..  Follow Up Plan:  Client will speak to pt and find out which medication she needs assistance with and call me back at [phone].  April Green Care Guide, Embedded Care Coordination [facility], Care Management Phone: [phone] Email: [email or web address]

## 2020-09-17 NOTE — Telephone Encounter (Signed)
Pt advised of provider feedback and states she really would prefer to have a female M.D. affiliated with her case. Advised her we only have 1 female doctor our office and she is currently not accepting new patients but have 2 other female NP's Erie Noe and B Alona Bene. Pt states she would like to try B [place]. Advised pt we would need to send Alona Bene a message to make sure she is ok with this and will let the pt know when Brayton El comes back from vacation next week. Pt voiced understanding.

## 2020-09-17 NOTE — Telephone Encounter (Signed)
At this time I am not able to take her back on as a patient. She may remain with Dr. Darlyn Read or try another female provider at this office if she would prefer.

## 2020-09-18 ENCOUNTER — Telehealth: Payer: Self-pay | Admitting: Family Medicine

## 2020-09-18 NOTE — Telephone Encounter (Signed)
   Telephone encounter was:  Successful.  09/18/2020 Name: Latiffany Harwick MRN: [id number] DOB: 12/08/1930  Levetta Bognar is a 87 y.o. year old female who is a primary care patient of Stacks, Broadus John, MD . The community resource team was consulted for assistance with Financial Difficulties related to medication and prescription glasses. Pt also feels she should qualify for Medicaid.  Care guide performed the following interventions: Patient provided with information about care guide support team and interviewed to confirm resource needs Discussed resources to assist with medication, glasses and to see if pt income qualifies for Medicaid . Emailed pt Medical illustrator for [employer].  Follow Up Plan:  No further follow up planned at this time. The patient has been provided with needed resources.  April Green Care Guide, Embedded Care Coordination [facility], Care Management Phone: [phone] Email: [email or web address]

## 2020-09-23 NOTE — Telephone Encounter (Signed)
That is fine 

## 2020-09-23 NOTE — Telephone Encounter (Signed)
Patient aware.

## 2020-09-25 ENCOUNTER — Ambulatory Visit: Payer: Medicare Other | Admitting: Family Medicine

## 2020-09-30 ENCOUNTER — Telehealth: Payer: Self-pay | Admitting: Cardiology

## 2020-09-30 NOTE — Telephone Encounter (Signed)
I called patient. She states that her blood pressures are still high- numbers are on Monday: 166/66, 176/68-  TODAY: it was 123/58, and 182/74.  She denies any other symptoms- chest pain, shortness of breath, or swelling. She is still concerned with her BP being elevated.  When we were discussing her medications patient states that she is taking Losartan in the AM, Hydralazine in the afternoon, and the Metoprolol in the evenings. After reviewing last note from February she was called and told to stop Losartan, and start Metoprolol, when I mentioned this to patient she states she was never called and told (documentation from previous nurse states spoke with patient) she states she has been continuing to take all three and she is still having high blood pressures. She would like to know what to do.  I advised I would send a message over to get advice and we can call her back with recommendations.

## 2020-09-30 NOTE — Telephone Encounter (Signed)
Pt c/o BP issue: STAT if pt c/o blurred vision, one-sided weakness or slurred speech  1. What are your last 5 BP readings? Monday 166/66 176/68-Today 170/68, 123/58 182.74  2. Are you having any other symptoms (ex. Dizziness, headache, blurred vision, passed out)? Mild Headaches and blurred  vision  3. What is your BP issue? High blood pressure

## 2020-10-01 ENCOUNTER — Telehealth: Payer: Self-pay | Admitting: Cardiology

## 2020-10-01 ENCOUNTER — Other Ambulatory Visit: Payer: Self-pay

## 2020-10-01 DIAGNOSIS — I10 Essential (primary) hypertension: Secondary | ICD-10-CM

## 2020-10-01 NOTE — Telephone Encounter (Signed)
Ordered referral for our HTN clinic- will notify patient they will call her to schedule.

## 2020-10-01 NOTE — Telephone Encounter (Signed)
I contacted patient, she states that she is not able to drive to [facility] as she only sees you in [place] and her transportation will not allow her but so many miles.  She states today her BP was 120/55 HR 67, and then a little later it was 110/49.   She states that something is going on. She would like another option. Will route to MD. Thanks!

## 2020-10-01 NOTE — Telephone Encounter (Signed)
Spoke to patient advised we can add her to Dr.Hochrein's wait list in [place].She does not want to see a extender.Stated she will have to find a new cardiologist.While talking to patient she hung up.

## 2020-10-01 NOTE — Telephone Encounter (Signed)
Called pt to schedule her PharmD appt but pt states that she see's Heidi Dorsey at [facility] and does not see the need to come to the the [facility] office to see a pharmacist here. She states that her BP is not being controlled and wants to know if blood work or an EKG needs to be done. She also wonders if she has high potassium because she does drink quite a bit of flavored seltzer water's. Please advise.

## 2020-10-01 NOTE — Telephone Encounter (Signed)
Spoke to patient she stated she does not want to see PharmD about her B/P.Stated she wants to see Dr.Hochrein only to discuss having possible other test done.Stated she was alittle upset when she was told to see PharmD.She felt like Dr.Hochrein did not want to see her.Patient reassured that was not the case.Appointment offered this afternoon with Dr.Hochrein at [facility] office.She stated she does not drive in [place].She wants to see him at [facility] office.No openings available there until Oct.Advised I will send message to Dr.Hochrein for advice.

## 2020-10-03 NOTE — Telephone Encounter (Signed)
Aram Beecham, Can you help with this patient? She is wanting to possibly see Dr.Hochrein in [place] at some point and he suggested to put her on the wait list. I am not sure what else to do.   Thank you!

## 2020-10-08 ENCOUNTER — Telehealth: Payer: Self-pay | Admitting: Family Medicine

## 2020-10-08 ENCOUNTER — Ambulatory Visit: Payer: Medicare Other | Admitting: Cardiology

## 2020-10-08 NOTE — Telephone Encounter (Signed)
Pt would like to switch from Stacks to [facility] for her PCP

## 2020-10-08 NOTE — Telephone Encounter (Signed)
Spoke with Dr Antoine Poche regarding pt's upcoming appt on 8/17.  He states approval to leave appt as scheduled for now.

## 2020-10-08 NOTE — Telephone Encounter (Signed)
This has already been addressed. I am not able to take her back on as a patient at this time. Per previous encounter in the chart, she has decided to change to [place].

## 2020-10-10 ENCOUNTER — Ambulatory Visit: Payer: Medicare Other | Admitting: Family Medicine

## 2020-10-15 ENCOUNTER — Ambulatory Visit: Payer: Medicare Other | Admitting: Family Medicine

## 2020-10-24 ENCOUNTER — Other Ambulatory Visit: Payer: Self-pay

## 2020-10-24 ENCOUNTER — Ambulatory Visit (INDEPENDENT_AMBULATORY_CARE_PROVIDER_SITE_OTHER): Payer: Medicare Other | Admitting: Family Medicine

## 2020-10-24 ENCOUNTER — Encounter: Payer: Self-pay | Admitting: Family Medicine

## 2020-10-24 VITALS — BP 117/63 | HR 73 | Temp 97.0°F | Ht <= 58 in | Wt 134.2 lb

## 2020-10-24 DIAGNOSIS — R059 Cough, unspecified: Secondary | ICD-10-CM

## 2020-10-24 DIAGNOSIS — J3489 Other specified disorders of nose and nasal sinuses: Secondary | ICD-10-CM | POA: Diagnosis not present

## 2020-10-24 DIAGNOSIS — R350 Frequency of micturition: Secondary | ICD-10-CM | POA: Diagnosis not present

## 2020-10-24 DIAGNOSIS — G629 Polyneuropathy, unspecified: Secondary | ICD-10-CM | POA: Diagnosis not present

## 2020-10-24 LAB — URINALYSIS, COMPLETE
Bilirubin, UA: NEGATIVE
Glucose, UA: NEGATIVE
Ketones, UA: NEGATIVE
Leukocytes,UA: NEGATIVE
Nitrite, UA: NEGATIVE
Protein,UA: NEGATIVE
RBC, UA: NEGATIVE
Specific Gravity, UA: 1.015 (ref 1.005–1.030)
Urobilinogen, Ur: 0.2 mg/dL (ref 0.2–1.0)
pH, UA: 6 (ref 5.0–7.5)

## 2020-10-24 LAB — MICROSCOPIC EXAMINATION
Bacteria, UA: NONE SEEN
Epithelial Cells (non renal): NONE SEEN /hpf (ref 0–10)
RBC, Urine: NONE SEEN /hpf (ref 0–2)
WBC, UA: NONE SEEN /hpf (ref 0–5)

## 2020-10-24 NOTE — Progress Notes (Signed)
Acute Office Visit  Subjective:    Patient ID: Heidi Dorsey, female    DOB: [birth year], 87 y.o.   MRN: [id number]  Chief Complaint  Patient presents with   Urinary Urgency   Numbness    In hands and feets    lesions in nose    Has taken amox    HPI Patient is in today for urinary urgency for some time. She reports that it is most bothersome at night because it interferes with her sleep. She feels the urge to need to void but then will only void a very small amount. She would like to be checked for a UTI today.   She was previously referred to an ENT for an intranasal lesion. She would like a new referral for a provider with [facility] or [facility]. She reports recurrent lesions for at least 1 year in the upper areas of her nasal cavity. She has recently tried antibiotics without improvement.   She has a history of neuropathy. She wonders if muscle conduction testing would be a good idea. She is established with a neurologist.   She also reports a chronic cough at night when she lays down. Denies postnasal drip. She does not tolerate flonase. She has tolerated claritin in the past and has some at home. Reports history of asthma but that symptoms have resolved as she has gotten older.   Past Medical History:  Diagnosis Date   Asthma    BPPV (benign paroxysmal positional vertigo)    Claustrophobia    Diverticulosis    GERD (gastroesophageal reflux disease)    H/O degenerative disc disease    Hiatal hernia    Hyperlipidemia    Hypertension    Kidney stones    Migraines    Osteoarthritis    Caused feet deformity   Osteopenia    Prediabetes    Preglaucoma    Raynauds syndrome    Rectocele    Rosacea    Sciatica    Sleep apnea    Spinal stenosis    Vertebrobasilar artery syndrome     Past Surgical History:  Procedure Laterality Date   ABDOMINAL HYSTERECTOMY  1978   ANTERIOR (CYSTOCELE) AND POSTERIOR REPAIR (RECTOCELE) WITH XENFORM GRAFT AND SACROSPINOUS FIXATION     BILATERAL  OOPHORECTOMY  1997   bladder stones     kidney colic/ procedure performed in 1973 at [facility] Left 12/1987   neg   BREAST BIOPSY Right 02/1977   neg papilloma   CARDIAC CATHETERIZATION  2000   HERNIA REPAIR     plantar fibroma  Left 09/1997   Excised    Family History  Problem Relation Age of Onset   Osteoporosis Mother    Ovarian cancer Mother    Uterine cancer Mother    Brain cancer Father    Bladder Cancer Brother    Rectal cancer Brother    Skin cancer Brother    Osteoporosis Sister    Diabetes Paternal Grandmother    Breast cancer Neg Hx     Social History   Socioeconomic History   Marital status: Single    Spouse name: Not on file   Number of children: Not on file   Years of education: Not on file   Highest education level: Not on file  Occupational History   Occupation: Retired    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    Drinks Half [employer] Coffee and Tea   Originally from [place], then lived in [place] most of her life - moved here 09/2019 - feels out of place, having trouble finding others with similar interests   She is agnostic - not religious (although raised Catholic) - strong scientific beliefs    Social Determinants of Radio broadcast assistant Strain: Medium Risk   Difficulty of Paying Living Expenses: Somewhat hard  Food Insecurity: No Food Insecurity   Worried About Charity fundraiser in the Last Year: Never  true   Arboriculturist in the Last Year: Never true  Transportation Needs: No Transportation Needs   Lack of Transportation (Medical): No   Lack of Transportation (Non-Medical): No  Physical Activity: Insufficiently Active   Days of Exercise per Week: 7 days   Minutes of Exercise per Session: 20 min  Stress: No Stress Concern Present   Feeling of Stress : Not at all  Social Connections: Socially Isolated   Frequency of Communication with Friends and Family: More than three times a week   Frequency of Social Gatherings with Friends and Family: Once a week   Attends Religious Services: Never   Marine scientist or Organizations: No   Attends Music therapist: Never   Marital Status: Divorced  Human resources officer Violence: Not At Risk   Fear of Current or Ex-Partner: No   Emotionally Abused: No   Physically Abused: No   Sexually Abused: No    Outpatient Medications Prior to Visit  Medication Sig Dispense Refill   hydrALAZINE (APRESOLINE) 10 MG tablet Take 1 tablet (10 mg total) by mouth as needed. Take 1 tab every 8 hours as needed 90 tablet 1   Multiple Vitamin (MULTI-VITAMIN) tablet Take by mouth.     Omega-3 1000 MG CAPS Take by mouth.     Ascorbic Acid 500 MG CAPS Take by mouth. (Patient not taking: No sig reported)     calcium carbonate (TUMS - DOSED IN MG ELEMENTAL CALCIUM) 500 MG chewable tablet Chew 1 tablet by mouth as needed for indigestion or heartburn. (Patient not taking: No sig reported)     metoprolol succinate (TOPROL-XL) 50 MG 24 hr tablet Take 1 tablet (50 mg total) by mouth daily. Take with or immediately following a meal. (Patient not taking: No sig reported) 90 tablet 3   mirabegron ER (MYRBETRIQ) 25 MG TB24 tablet Take 1 tablet (25 mg total) by mouth daily. (Patient not taking: No sig reported) 30 tablet 2   No facility-administered medications prior to visit.    Allergies  Allergen Reactions   Calcitonin ([place]) Other (See Comments)    LEFT  BUNDLE BRANCH BLOCK   Estradiol Other (See Comments)    Headache   Fexofenadine Other (See Comments)    Syncope   Miacalcin [Calcitonin] Palpitations   Nsaids Other (See Comments)    Stomach ulcer after taking for 3  days in 2009. Negative H. Pylori test.   Diphenhydramine Other (See Comments)    Syncope   Clarithromycin Other (See Comments)    CAN TAKE AZITHROMYCIN   Codeine Other (See Comments)   Doxepin Hcl Other (See Comments)    Heart arrhythmia   Epinephrine Other (See Comments)   Erythromycin Other (See Comments)    Stomach irritation   Fluticasone Other (See Comments)   Fluticasone Propionate Other (See Comments)    And similar corticoid steroids   Ipratropium Bromide Other (See Comments)    Syncope   Lisinopril Other (See Comments)    Other Reaction: BRUISING, CANKERS & ARTHRALGIAS   Other Other (See Comments)    Paraphenylendiamine/propanol amine/anti-cholinergic compounds   Sulfa Antibiotics Other (See Comments)   Tetracycline Other (See Comments)    Mouth sores after just 2 doses. DOXYCYCLINE is OK per patient   Amoxicillin-Pot Clavulanate Rash    Able to take plain amoxicillin   Ciprofloxacin Rash   Latex Rash   Nitrofurantoin Other (See Comments)    Multiple mild symptoms - would prefer not to take again    Review of Systems As per HPI.     Objective:    Physical Exam Vitals and nursing note reviewed.  Constitutional:      General: She is not in acute distress.    Appearance: She is not ill-appearing, toxic-appearing or diaphoretic.  HENT:     Nose: Nose normal.     Mouth/Throat:     Mouth: Mucous membranes are moist.     Pharynx: Oropharynx is clear.  Eyes:     General: No scleral icterus. Cardiovascular:     Rate and Rhythm: Normal rate and regular rhythm.     Heart sounds: Normal heart sounds. No murmur heard. Pulmonary:     Effort: Pulmonary effort is normal. No respiratory distress.     Breath sounds: Normal breath sounds.   Musculoskeletal:     Right lower leg: No edema.     Left lower leg: No edema.  Skin:    General: Skin is warm and dry.  Neurological:     General: No focal deficit present.     Mental Status: She is alert and oriented to person, place, and time.  Psychiatric:        Mood and Affect: Mood normal.        Behavior: Behavior normal.   Urine dipstick shows negative for all components.  Micro exam: negative   BP 117/63   Pulse 73   Temp (!) 97 F (36.1 C) (Temporal)   Ht $R'4\' 10"'ZQ$  (1.473 m)   Wt 134 lb 3.2 oz (60.9 kg)   SpO2 99%   BMI 28.05 kg/m  Wt Readings from Last 3 Encounters:  10/24/20 134 lb 3.2 oz (60.9 kg)  09/15/20 132 lb (59.9 kg)  08/20/20 133 lb (60.3 kg)    Health Maintenance Due  Topic Date Due   Zoster Vaccines- Shingrix (1 of 2) Never done   COVID-19 Vaccine (4 - Booster for Pfizer series) 05/18/2020    There are no preventive care reminders to display for this patient.   Lab Results  Component Value Date   TSH 2.650 05/14/2020   Lab Results  Component Value Date   WBC 5.6 07/16/2020   HGB 13.5 07/16/2020   HCT 41.8 07/16/2020   MCV 89 07/16/2020   PLT 173 07/16/2020   Lab Results  Component Value Date   NA 139 07/16/2020   K 4.7 07/16/2020  CO2 22 07/16/2020   GLUCOSE 100 (H) 07/16/2020   BUN 14 07/16/2020   CREATININE 0.77 07/16/2020   BILITOT 0.3 07/16/2020   ALKPHOS 78 07/16/2020   AST 17 07/16/2020   ALT 15 07/16/2020   PROT 6.5 07/16/2020   ALBUMIN 4.6 07/16/2020   CALCIUM 9.7 07/16/2020   ANIONGAP 8 05/31/2020   EGFR 75 07/16/2020   Lab Results  Component Value Date   CHOL 230 (H) 07/16/2020   Lab Results  Component Value Date   HDL 89 07/16/2020   Lab Results  Component Value Date   LDLCALC 119 (H) 07/16/2020   Lab Results  Component Value Date   TRIG 131 07/16/2020   Lab Results  Component Value Date   CHOLHDL 2.6 07/16/2020   No results found for: HGBA1C     Assessment & Plan:   Heidi Dorsey was seen today  for urinary urgency, numbness and lesions in nose.  Diagnoses and all orders for this visit:  Frequency of urination Negative UA. Samples of mybetriq given.  -     Urinalysis, Complete -     Microscopic Examination  Internal nasal lesion New referral placed.  -     Ambulatory referral to ENT  Cough She will try clartin.   Neuropathy She will contact her neurologist to discuss nerve conduction testing.   Return in about 4 weeks (around 11/21/2020) for with Hendricks Limes to establish care.  The patient indicates understanding of these issues and agrees with the plan.   Gwenlyn Perking, FNP

## 2020-10-24 NOTE — Patient Instructions (Signed)

## 2020-10-26 LAB — URINE CULTURE

## 2020-11-07 ENCOUNTER — Telehealth: Payer: Self-pay | Admitting: Cardiology

## 2020-11-07 NOTE — Telephone Encounter (Signed)
Pt c/o BP issue: STAT if pt c/o blurred vision, one-sided weakness or slurred speech  1. What are your last 5 BP readings?  11/07/20 115/55 HR 78 8:59 AM  11/06/20 107/51 HR 82 when out shopping vision got foggy felt she was going to fall over  2. Are you having any other symptoms (ex. Dizziness, headache, blurred vision, passed out)? Dizziness, feeling awful, & blurred vision   3. What is your BP issue? Hypotension. Symptoms occurred yesterday. Wants to know if she should take BP meds today.

## 2020-11-07 NOTE — Telephone Encounter (Signed)
Pt state yesterday while out shopping with her daughter, her vision got foggy and she felt like she was going to fall over. When she got home she checked her BP and it was 107/51 HR 82. Pt state she had the same symptom this morning while trying to make her bed and BP was 115/55 HR 78. Pt state right now she feels ok and current BP 146/65 HR 73.   Medication list not current. Below I have listed what pt is taking. Hydralazine 10 mg daily (2 pm) Losartan 25 mg daily (10 am) Not taking metoprolol   Pt has not taking any BP medication today and questioning if MD/ Pharm D recommends any changes.

## 2020-11-07 NOTE — Telephone Encounter (Signed)
Spoke with pt, aware of the recommendations. She has been taking the hydralazine daily because her pharmacist told her it did not make sense to take it as needed. She will stop the hydralazine until needed.

## 2020-11-07 NOTE — Telephone Encounter (Signed)
Left message to call back  

## 2020-11-07 NOTE — Telephone Encounter (Signed)
Patient returning call to our office.

## 2020-11-19 ENCOUNTER — Telehealth: Payer: Self-pay | Admitting: Family Medicine

## 2020-11-19 NOTE — Telephone Encounter (Signed)
Discussed with MA.

## 2020-11-19 NOTE — Telephone Encounter (Signed)
Can we take them back ?

## 2020-11-24 NOTE — Progress Notes (Signed)
Cardiology Office Note   Date:  11/26/2020   ID:  [name], [name] 1930/08/16, MRN [id number]  PCP:  Gwenlyn Fudge, FNP  Cardiologist:   None Referring:  Gwenlyn Fudge, FNP  Chief Complaint  Patient presents with   Peripheral Neuropathy       History of Present Illness: Heidi Dorsey is a 87 y.o. female who presents for evaluation of palpitations.    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  She had a long history of left bundle branch block.  She has had a cardiac catheterization in 2000.  She does not remember the results but was not told that she had vascular disease.  I see that she has had carotid Dopplers which were unremarkable.  She had a very mildly reduced ejection fraction on the stress echo in 2017 with her EF being 50%.  She has had a 24-hour Holter.  She previously has been treated with metoprolol.  Most recently she was on Cozaar 12.5 mg daily but this was discontinued because her blood pressures were running low.   She also called back and she was had an eye bleed on ARB so she did not want to start this.  She has also been intolerant of amlodipine.  She cannot take ACE inhibitors.  She seemed to be tolerating beta blocker so at the last visit I was prescribing this.  I had suggested hydralazine PRN SBP greater than 160.  She called back because she did not want to take the hydralazine because she read it would worsen her asthma.    When we spoke to her in June she was taking Losartan in the AM and metoprolol in the evening.  She was back to taking hydralazine but because a pharmacist told her it did not make sense to take it PRN she was taking it daily.  She did not want to be seen in the HTN clinic.  I tried to schedule her to see me in [place] as there were no appts in [place] and she would not do this.  Her blood pressure has been labile.    She has complaints.  We reviewed what she thinks is neuropathic pain in her feet and her  hands.  She has difficulty sleeping.  Her blood pressure is very labile and she does not feel well when this 110.  She is taking the full Cozaar 25 mg right now.  She is back to using the hydralazine as needed.  She apparently is taking the metoprolol.  She is not having any presyncope or syncope.  She uses a walker for balance.  She is not having any new chest pressure, neck or arm discomfort.  She has had no weight gain or edema.  Past Medical History:  Diagnosis Date   Asthma    BPPV (benign paroxysmal positional vertigo)    Claustrophobia    Diverticulosis    GERD (gastroesophageal reflux disease)    H/O degenerative disc disease    Hiatal hernia    Hyperlipidemia    Hypertension    Kidney stones    Migraines    Osteoarthritis    Caused feet deformity   Osteopenia    Prediabetes    Preglaucoma    Raynauds syndrome    Rectocele    Rosacea    Sciatica    Sleep apnea    Spinal stenosis    Vertebrobasilar artery syndrome     Past Surgical History:  Procedure Laterality Date   ABDOMINAL HYSTERECTOMY  1978   ANTERIOR (CYSTOCELE) AND POSTERIOR REPAIR (RECTOCELE) WITH XENFORM GRAFT AND SACROSPINOUS FIXATION     BILATERAL OOPHORECTOMY  1997   bladder stones     kidney colic/ procedure performed in 1973 at [facility]   BLADDER SUSPENSION     BREAST BIOPSY Left 12/1987   neg   BREAST BIOPSY Right 02/1977   neg papilloma   CARDIAC CATHETERIZATION  2000   HERNIA REPAIR     plantar fibroma  Left 09/1997   Excised     Current Outpatient Medications  Medication Sig Dispense Refill   Ascorbic Acid 500 MG CAPS Take by mouth.     calcium carbonate (TUMS - DOSED IN MG ELEMENTAL CALCIUM) 500 MG chewable tablet Chew 1 tablet by mouth as needed for indigestion or heartburn.     losartan (COZAAR) 25 MG tablet Take 25 mg by mouth daily as needed.     Multiple Vitamin (MULTI-VITAMIN) tablet Take by mouth.     Omega-3 1000 MG CAPS Take by mouth.     hydrALAZINE (APRESOLINE) 10 MG tablet Take  1 tablet (10 mg total) by mouth as needed. Take 1 tab every 8 hours as needed (Patient not taking: Reported on 11/26/2020) 90 tablet 1   metoprolol succinate (TOPROL-XL) 50 MG 24 hr tablet Take 1 tablet (50 mg total) by mouth daily. Take with or immediately following a meal. (Patient not taking: No sig reported) 90 tablet 3   mirabegron ER (MYRBETRIQ) 25 MG TB24 tablet Take 1 tablet (25 mg total) by mouth daily. (Patient not taking: No sig reported) 30 tablet 2   No current facility-administered medications for this visit.    Allergies:   Calcitonin (salmon), Estradiol, Fexofenadine, Miacalcin [calcitonin], Nsaids, Diphenhydramine, Clarithromycin, Codeine, Doxepin hcl, Epinephrine, Erythromycin, Fluticasone, Fluticasone propionate, Ipratropium bromide, Lisinopril, Other, Sulfa antibiotics, Tetracycline, Amoxicillin-pot clavulanate, Ciprofloxacin, Latex, and Nitrofurantoin    ROS:  Please see the history of present illness.   Otherwise, review of systems are positive for none.   All other systems are reviewed and negative.    PHYSICAL EXAM: VS:  BP (!) 142/70   Pulse 68   Ht 4\' 10"  (1.473 m)   Wt 130 lb (59 kg)   BMI 27.17 kg/m  , BMI Body mass index is 27.17 kg/m. GENERAL:  Well appearing NECK:  No jugular venous distention, waveform within normal limits, carotid upstroke brisk and symmetric, no bruits, no thyromegaly LUNGS:  Clear to auscultation bilaterally CHEST:  Unremarkable HEART:  PMI not displaced or sustained,S1 and S2 within normal limits, no S3, no S4, no clicks, no rubs, no murmurs ABD:  Flat, positive bowel sounds normal in frequency in pitch, no bruits, no rebound, no guarding, no midline pulsatile mass, no hepatomegaly, no splenomegaly EXT:  2 plus pulses upper and mildly diminished dorsalis pedis and posterior tibialis bilateral, no edema, no cyanosis no clubbing   EKG:  EKG is not  ordered today.   Recent Labs: 05/14/2020: TSH 2.650 07/16/2020: ALT 15; BUN 14;  Creatinine, Ser 0.77; Hemoglobin 13.5; Platelets 173; Potassium 4.7; Sodium 139    Lipid Panel    Component Value Date/Time   CHOL 230 (H) 07/16/2020 1227   TRIG 131 07/16/2020 1227   HDL 89 07/16/2020 1227   CHOLHDL 2.6 07/16/2020 1227   LDLCALC 119 (H) 07/16/2020 1227      Wt Readings from Last 3 Encounters:  11/26/20 130 lb (59 kg)  10/24/20 134 lb 3.2 oz (  60.9 kg)  09/15/20 132 lb (59.9 kg)      Other studies Reviewed: Additional studies/ records that were reviewed today include: None Review of the above records demonstrates:     ASSESSMENT AND PLAN:  PALPITATIONS:    She is really not particular bothered by these.  No change in therapy.  HTN:       Her blood pressure is labile.  We had another long discussion about this.  I tried to outline for her therapy where she can take a half of the valsartan in the morning if her blood pressure is low.  He gets tired perhaps around 140 she can take 25 mg.  If she takes a half of the losartan later in the day her blood pressure is elevated she can take the other half.  I have suggested that she use the hydralazine if her systolics are 170 or 180 and that she could use it every 8 hours if she needed.  She can continue on the beta-blocker.  LBBB:   This has been chronic and she had an extensive work-up.  No change in therapy.  INSOMNIA: I have referred her to neurology as below.  NEUROPATHY: I have suggested possibly benfotiamine OTC.  I think she would be very sensitive to any other medications but again she can talk this over with her neurologist. I will check A1c  LEG PAIN: I will measure ABIs although I doubt that this is vascular I think she is probably having neuropathic pain.  Current medicines are reviewed at length with the patient today.  The patient does not have concerns regarding medicines.  The following changes have been made: None  Labs/ tests ordered today include:   Orders Placed This Encounter  Procedures    HgB A1c   Ambulatory referral to Neurology   VAS [place] LOWER EXTREMITY ARTERIAL DUPLEX   VAS [place] ABI WITH/WO TBI      Disposition:   FU with 6 months   Signed, Rollene Rotunda, MD  11/26/2020 11:21 AM    [facility]

## 2020-11-26 ENCOUNTER — Ambulatory Visit (INDEPENDENT_AMBULATORY_CARE_PROVIDER_SITE_OTHER): Payer: Medicare Other | Admitting: Cardiology

## 2020-11-26 ENCOUNTER — Other Ambulatory Visit: Payer: Self-pay

## 2020-11-26 ENCOUNTER — Encounter: Payer: Self-pay | Admitting: Cardiology

## 2020-11-26 ENCOUNTER — Other Ambulatory Visit: Payer: Medicare Other

## 2020-11-26 VITALS — BP 142/70 | HR 68 | Ht <= 58 in | Wt 130.0 lb

## 2020-11-26 DIAGNOSIS — M79604 Pain in right leg: Secondary | ICD-10-CM | POA: Diagnosis not present

## 2020-11-26 DIAGNOSIS — I10 Essential (primary) hypertension: Secondary | ICD-10-CM

## 2020-11-26 DIAGNOSIS — R7309 Other abnormal glucose: Secondary | ICD-10-CM

## 2020-11-26 DIAGNOSIS — R002 Palpitations: Secondary | ICD-10-CM | POA: Diagnosis not present

## 2020-11-26 DIAGNOSIS — I447 Left bundle-branch block, unspecified: Secondary | ICD-10-CM

## 2020-11-26 DIAGNOSIS — G629 Polyneuropathy, unspecified: Secondary | ICD-10-CM

## 2020-11-26 DIAGNOSIS — M79605 Pain in left leg: Secondary | ICD-10-CM

## 2020-11-26 NOTE — Patient Instructions (Signed)
Medication Instructions:  The current medical regimen is effective;  continue present plan and medications.  *If you need a refill on your cardiac medications before your next appointment, please call your pharmacy*  Lab Work: Please have blood work at [facility] (HA1c)  If you have labs (blood work) drawn today and your tests are completely normal, you will receive your results only by: MyChart Message (if you have MyChart) OR A paper copy in the mail If you have any lab test that is abnormal or we need to change your treatment, we will call you to review the results.  Testing/Procedures: Your physician has requested that you have a lower extremity arterial exercise duplex. During this test, exercise and ultrasound are used to evaluate arterial blood flow in the legs. Allow one hour for this exam. There are no restrictions or special instructions.  This testing is completed at our [facility] office, (2nd Floor) [place], [place].  Follow-Up: At [facility], you and your health needs are our priority.  As part of our continuing mission to provide you with exceptional heart care, we have created designated Provider Care Teams.  These Care Teams include your primary Cardiologist (physician) and Advanced Practice Providers (APPs -  Physician Assistants and Nurse Practitioners) who all work together to provide you with the care you need, when you need it.  We recommend signing up for the patient portal called "MyChart".  Sign up information is provided on this After Visit Summary.  MyChart is used to connect with patients for Virtual Visits (Telemedicine).  Patients are able to view lab/test results, encounter notes, upcoming appointments, etc.  Non-urgent messages can be sent to your provider as well.   To learn more about what you can do with MyChart, go to [email or web address].    Your next appointment:   6 month(s)  The format for your next appointment:   In Person  Provider:   Rollene Rotunda, MD   You have been referred to Dr Johnnette Gourd and will be contacted to be scheduled for an appointment.  Benfotiamine

## 2020-11-27 LAB — HEMOGLOBIN A1C
Est. average glucose Bld gHb Est-mCnc: 120 mg/dL
Hgb A1c MFr Bld: 5.8 % — ABNORMAL HIGH (ref 4.8–5.6)

## 2020-11-28 ENCOUNTER — Encounter: Payer: Self-pay | Admitting: *Deleted

## 2020-12-01 ENCOUNTER — Telehealth: Payer: Self-pay | Admitting: Family Medicine

## 2020-12-01 NOTE — Telephone Encounter (Signed)
   Heidi Dorsey DOB: [birth year] MRN: [id number]   RIDER WAIVER AND RELEASE OF LIABILITY  For purposes of improving physical access to our facilities, [facility] is pleased to partner with third parties to provide [facility] patients or other authorized individuals the option of convenient, on-demand ground transportation services (the [employer]") through use of the technology service that enables users to request on-demand ground transportation from independent third-party providers.  By opting to use and accept these [employer], I, the undersigned, hereby agree on behalf of myself, and on behalf of any minor child using the Science writer for whom I am the parent or legal guardian, as follows:  Science writer provided to me are provided by independent third-party transportation providers who are not [employer] or employees and who are unaffiliated with [employer]. [employer] is neither a transportation carrier nor a common or public carrier. [facility] has no control over the quality or safety of the transportation that occurs as a result of the [employer]. [name] cannot guarantee that any third-party transportation provider will complete any arranged transportation service. [facility] makes no representation, warranty, or guarantee regarding the reliability, timeliness, quality, safety, suitability, or availability of any of the Transport Services or that they will be error free. I fully understand that traveling by vehicle involves risks and dangers of serious bodily injury, including permanent disability, paralysis, and death. I agree, on behalf of myself and on behalf of any minor child using the Transport Services for whom I am the parent or legal guardian, that the entire risk arising out of my use of the [employer] remains solely with me, to the maximum extent permitted under applicable law. The [employer] are provided "as  is" and "as available." [name] disclaims all representations and warranties, express, implied or statutory, not expressly set out in these terms, including the implied warranties of merchantability and fitness for a particular purpose. I hereby waive and release [facility], its agents, employees, officers, directors, representatives, insurers, attorneys, assigns, successors, subsidiaries, and affiliates from any and all past, present, or future claims, demands, liabilities, actions, causes of action, or suits of any kind directly or indirectly arising from acceptance and use of the [employer]. I further waive and release [employer] and its affiliates from all present and future liability and responsibility for any injury or death to persons or damages to property caused by or related to the use of the [employer]. I have read this Waiver and Release of Liability, and I understand the terms used in it and their legal significance. This Waiver is freely and voluntarily given with the understanding that my right (as well as the right of any minor child for whom I am the parent or legal guardian using the [employer]) to legal recourse against [place] in connection with the [employer] is knowingly surrendered in return for use of these services.   I attest that I read the consent document to Heidi Dorsey, gave [name] the opportunity to ask questions and answered the questions asked (if any). I affirm that [name] then provided consent for she's participation in this program.     [name]

## 2020-12-02 ENCOUNTER — Telehealth: Payer: Self-pay | Admitting: Cardiology

## 2020-12-02 NOTE — Telephone Encounter (Signed)
   Arianna with [facility] called, she said she missed a call from Dr. Jenene Slicker nurse

## 2020-12-02 NOTE — Telephone Encounter (Signed)
Left message for neuro new patient scheduler, not sure who called but there is a referral for the patient to be seen. She is to call back with questions.

## 2020-12-04 ENCOUNTER — Ambulatory Visit (INDEPENDENT_AMBULATORY_CARE_PROVIDER_SITE_OTHER): Payer: Medicare Other | Admitting: Otolaryngology

## 2020-12-10 ENCOUNTER — Other Ambulatory Visit: Payer: Self-pay

## 2020-12-10 ENCOUNTER — Ambulatory Visit (HOSPITAL_COMMUNITY)
Admission: RE | Admit: 2020-12-10 | Discharge: 2020-12-10 | Disposition: A | Discharge status: Home / Self Care | Payer: Medicare Other | Source: Ambulatory Visit | Attending: Internal Medicine | Admitting: Internal Medicine

## 2020-12-10 DIAGNOSIS — M79604 Pain in right leg: Secondary | ICD-10-CM

## 2020-12-10 DIAGNOSIS — M79605 Pain in left leg: Secondary | ICD-10-CM | POA: Insufficient documentation

## 2020-12-12 ENCOUNTER — Telehealth: Payer: Self-pay | Admitting: Cardiology

## 2020-12-12 ENCOUNTER — Other Ambulatory Visit: Payer: Self-pay | Admitting: Cardiology

## 2020-12-12 MED ORDER — LOSARTAN POTASSIUM 25 MG PO TABS: 25.0000 mg | ORAL_TABLET | Freq: Every day | ORAL | 3 refills | Status: DC | PRN

## 2020-12-12 NOTE — Telephone Encounter (Signed)
Refills has been sent to the pharmacy. 

## 2020-12-12 NOTE — Telephone Encounter (Signed)
*  STAT* If patient is at the pharmacy, call can be transferred to refill team.   1. Which medications need to be refilled? (please list name of each medication and dose if known)  losartan (COZAAR) 25 MG tablet  2. Which pharmacy/location (including street and city if local pharmacy) is medication to be sent to? [facility] [place], [place] - [address]  3. Do they need a 30 day or 90 day supply? 90 day supply

## 2020-12-18 ENCOUNTER — Ambulatory Visit (INDEPENDENT_AMBULATORY_CARE_PROVIDER_SITE_OTHER): Payer: Medicare Other | Admitting: Family Medicine

## 2020-12-18 ENCOUNTER — Encounter: Payer: Self-pay | Admitting: Family Medicine

## 2020-12-18 DIAGNOSIS — I10 Essential (primary) hypertension: Secondary | ICD-10-CM | POA: Diagnosis not present

## 2020-12-18 DIAGNOSIS — N3281 Overactive bladder: Secondary | ICD-10-CM

## 2020-12-18 DIAGNOSIS — R200 Anesthesia of skin: Secondary | ICD-10-CM | POA: Diagnosis not present

## 2020-12-18 NOTE — Progress Notes (Signed)
   Virtual Visit  Note Due to COVID-19 pandemic this visit was conducted virtually. This visit type was conducted due to national recommendations for restrictions regarding the COVID-19 Pandemic (e.g. social distancing, sheltering in place) in an effort to limit this patient's exposure and mitigate transmission in our community. All issues noted in this document were discussed and addressed.  A physical exam was not performed with this format.  I connected with Heidi Dorsey on 12/18/20 at 1526 by telephone and verified that I am speaking with the correct person using two identifiers. Heidi Dorsey is currently located at home and no one is currently with her during the visit. The provider, Gabriel Earing, FNP is located in their office at time of visit.  I discussed the limitations, risks, security and privacy concerns of performing an evaluation and management service by telephone and the availability of in person appointments. I also discussed with the patient that there may be a patient responsible charge related to this service. The patient expressed understanding and agreed to proceed.  CC: hypertension  History and Present Illness:  HPI Heidi Dorsey is requesting some clarification today regarding some of the medication she is taking for HTN that have been prescribed by her cardiologist. She has been prescribed hydralazine prn but she is not sure when she should take this. She reports her BP has been well controlled with losartan and metoprolol. Dr. Antoine Poche ordered ABIs for her to have done and she would like to know the results of this. She has been experiencing numbness in her left hand and foot. Dr. Antoine Poche has referred her to neurology for this. She would also like to know if the myrbetriq samples that were given to her would help her urinary symptoms at night. She reports urgency at nigh that has been ongoing for some time. She has had negative testing for UTIs since her symptoms started.      ROS As per HPI.   Observations/Objective: Alert and oriented x 3. Able to speak in full sentences without difficulty.   Assessment and Plan: Heidi Dorsey was seen today for hypertension.  Diagnoses and all orders for this visit:  Primary hypertension Reports well controlled. Discussed that per Dr. Saul Fordyce last note, he order hydralazine prn for systolic BP >170. Discussed that she should contact his office regarding test results for ABIs.   Numbness Left hand and foot. She has been referred to neurology.   Overactive bladder Discussed myrbetriq use to control symptoms.    Follow Up Instructions: Return to office for new or worsening symptoms, or if symptoms persist.     I discussed the assessment and treatment plan with the patient. The patient was provided an opportunity to ask questions and all were answered. The patient agreed with the plan and demonstrated an understanding of the instructions.   The patient was advised to call back or seek an in-person evaluation if the symptoms worsen or if the condition fails to improve as anticipated.  The above assessment and management plan was discussed with the patient. The patient verbalized understanding of and has agreed to the management plan. Patient is aware to call the clinic if symptoms persist or worsen. Patient is aware when to return to the clinic for a follow-up visit. Patient educated on when it is appropriate to go to the emergency department.   Time call ended:  1543  I provided 18 minutes of  non face-to-face time during this encounter.    Gabriel Earing, FNP

## 2020-12-25 ENCOUNTER — Ambulatory Visit: Payer: Medicare Other

## 2020-12-31 ENCOUNTER — Encounter: Payer: Self-pay | Admitting: Family Medicine

## 2020-12-31 ENCOUNTER — Ambulatory Visit (INDEPENDENT_AMBULATORY_CARE_PROVIDER_SITE_OTHER): Payer: Medicare Other | Admitting: Family Medicine

## 2020-12-31 ENCOUNTER — Other Ambulatory Visit: Payer: Self-pay

## 2020-12-31 ENCOUNTER — Ambulatory Visit: Payer: Medicare Other | Admitting: Family Medicine

## 2020-12-31 VITALS — BP 137/62 | HR 66 | Temp 97.8°F | Ht <= 58 in | Wt 130.4 lb

## 2020-12-31 DIAGNOSIS — Z23 Encounter for immunization: Secondary | ICD-10-CM | POA: Diagnosis not present

## 2020-12-31 DIAGNOSIS — J3489 Other specified disorders of nose and nasal sinuses: Secondary | ICD-10-CM | POA: Diagnosis not present

## 2020-12-31 MED ORDER — MUPIROCIN 2 % EX OINT: 1.0000 "application " | TOPICAL_OINTMENT | Freq: Two times a day (BID) | CUTANEOUS | 0 refills | Status: DC

## 2020-12-31 NOTE — Progress Notes (Signed)
Acute Office Visit  Subjective:    Patient ID: Heidi Dorsey, female    DOB: Aug 11, 1930, 87 y.o.   MRN: [id number]  Chief Complaint  Patient presents with   Cyst    HPI Patient is in today for an internal nasal lesion. She has been referred to ENT previously for this however was not able to make an appointment and the referral has now been closed. She reports recurrent intranasal lesions for at least the last year. She reports that they are located on the upper inside of her nose. She was given a nasal antibiotic ointment that did help somewhat. She would like a new referral today.    Past Medical History:  Diagnosis Date   Asthma    BPPV (benign paroxysmal positional vertigo)    Claustrophobia    Diverticulosis    GERD (gastroesophageal reflux disease)    H/O degenerative disc disease    Hiatal hernia    Hyperlipidemia    Hypertension    Kidney stones    Migraines    Osteoarthritis    Caused feet deformity   Osteopenia    Prediabetes    Preglaucoma    Raynauds syndrome    Rectocele    Rosacea    Sciatica    Sleep apnea    Spinal stenosis    Vertebrobasilar artery syndrome     Past Surgical History:  Procedure Laterality Date   ABDOMINAL HYSTERECTOMY  1978   ANTERIOR (CYSTOCELE) AND POSTERIOR REPAIR (RECTOCELE) WITH XENFORM GRAFT AND SACROSPINOUS FIXATION     BILATERAL OOPHORECTOMY  1997   bladder stones     kidney colic/ procedure performed in 1973 at [facility] Left 12/1987   neg   BREAST BIOPSY Right 02/1977   neg papilloma   CARDIAC CATHETERIZATION  2000   HERNIA REPAIR     plantar fibroma  Left 09/1997   Excised    Family History  Problem Relation Age of Onset   Osteoporosis Mother    Ovarian cancer Mother    Uterine cancer Mother    Brain cancer Father    Bladder Cancer Brother    Rectal cancer Brother    Skin cancer Brother    Osteoporosis Sister    Diabetes Paternal Grandmother    Breast cancer Neg Hx      Social History   Socioeconomic History   Marital status: Single    Spouse name: Not on file   Number of children: Not on file   Years of education: Not on file   Highest education level: Not on file  Occupational History   Occupation: Retired    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    Drinks [employer] Coffee and Tea   Originally from [place], then lived in [place] most of her life - moved here 09/2019 -  feels out of place, having trouble finding others with similar interests   She is agnostic - not religious (although raised Catholic) - strong scientific beliefs    Social Determinants of Health   Financial Resource Strain: Medium Risk   Difficulty of Paying Living Expenses: Somewhat hard  Food Insecurity: No Food Insecurity   Worried About Charity fundraiser in the Last Year: Never true   Arboriculturist in the Last Year: Never true  Transportation Needs: No Transportation Needs   Lack of Transportation (Medical): No   Lack of Transportation (Non-Medical): No  Physical Activity: Insufficiently Active   Days of Exercise per Week: 7 days   Minutes of Exercise per Session: 20 min  Stress: No Stress Concern Present   Feeling of Stress : Not at all  Social Connections: Socially Isolated   Frequency of Communication with Friends and Family: More than three times a week   Frequency of Social Gatherings with Friends and Family: Once a week   Attends Religious Services: Never    Marine scientist or Organizations: No   Attends Music therapist: Never   Marital Status: Divorced  Human resources officer Violence: Not At Risk   Fear of Current or Ex-Partner: No   Emotionally Abused: No   Physically Abused: No   Sexually Abused: No    Outpatient Medications Prior to Visit  Medication Sig Dispense Refill   Ascorbic Acid 500 MG CAPS Take by mouth.     calcium carbonate (TUMS - DOSED IN MG ELEMENTAL CALCIUM) 500 MG chewable tablet Chew 1 tablet by mouth as needed for indigestion or heartburn.     hydrALAZINE (APRESOLINE) 10 MG tablet Take 1 tablet (10 mg total) by mouth as needed. Take 1 tab every 8 hours as needed 90 tablet 1   losartan (COZAAR) 25 MG tablet Take 1 tablet (25 mg total) by mouth daily as needed. 90 tablet 3   metoprolol succinate (TOPROL-XL) 50 MG 24 hr tablet Take 1 tablet (50 mg total) by mouth daily. Take with or immediately following a meal. 90 tablet 3   Multiple Vitamin (MULTI-VITAMIN) tablet Take by mouth.     Omega-3 1000 MG CAPS Take by mouth.     mirabegron ER (MYRBETRIQ) 25 MG TB24 tablet Take 1 tablet (25 mg total) by mouth daily. (Patient not taking: Reported on 12/31/2020) 30 tablet 2   No facility-administered medications prior to visit.    Allergies  Allergen Reactions   Calcitonin ([place]) Other (See Comments)    LEFT BUNDLE BRANCH BLOCK   Estradiol Other (See Comments)    Headache   Fexofenadine Other (See Comments)    Syncope   Miacalcin [Calcitonin] Palpitations   Nsaids Other (See Comments)    Stomach ulcer after taking for 3 days in 2009. Negative H. Pylori test.   Diphenhydramine Other (See Comments)    Syncope   Clarithromycin Other (See Comments)    CAN TAKE AZITHROMYCIN   Codeine Other (See Comments)   Doxepin Hcl Other (See Comments)    Heart arrhythmia   Epinephrine Other (See Comments)   Erythromycin Other (See Comments)    Stomach irritation   Fluticasone Other (See Comments)   Fluticasone  Propionate Other (See Comments)    And similar corticoid steroids   Ipratropium Bromide Other (See Comments)    Syncope   Lisinopril Other (See Comments)    Other Reaction: BRUISING, CANKERS & ARTHRALGIAS   Other Other (See Comments)  Paraphenylendiamine/propanol amine/anti-cholinergic compounds   Sulfa Antibiotics Other (See Comments)   Tetracycline Other (See Comments)    Mouth sores after just 2 doses. DOXYCYCLINE is OK per patient   Amoxicillin-Pot Clavulanate Rash    Able to take plain amoxicillin   Ciprofloxacin Rash   Latex Rash   Nitrofurantoin Other (See Comments)    Multiple mild symptoms - would prefer not to take again    Review of Systems As per HPI.     Objective:    Physical Exam Vitals and nursing note reviewed.  Constitutional:      General: She is not in acute distress.    Appearance: Normal appearance. She is not ill-appearing, toxic-appearing or diaphoretic.  HENT:     Nose: Nose normal. No congestion or rhinorrhea.  Pulmonary:     Effort: Pulmonary effort is normal. No respiratory distress.  Musculoskeletal:     Right lower leg: No edema.     Left lower leg: No edema.  Skin:    General: Skin is warm and dry.  Neurological:     General: No focal deficit present.     Mental Status: She is alert and oriented to person, place, and time.  Psychiatric:        Mood and Affect: Mood normal.        Behavior: Behavior normal.    BP 137/62   Pulse 66   Temp 97.8 F (36.6 C) (Temporal)   Ht $R'4\' 10"'Ra$  (1.473 m)   Wt 130 lb 6 oz (59.1 kg)   BMI 27.25 kg/m  Wt Readings from Last 3 Encounters:  12/31/20 130 lb 6 oz (59.1 kg)  11/26/20 130 lb (59 kg)  10/24/20 134 lb 3.2 oz (60.9 kg)    Health Maintenance Due  Topic Date Due   Zoster Vaccines- Shingrix (1 of 2) Never done   COVID-19 Vaccine (4 - Booster for Pfizer series) 05/18/2020   INFLUENZA VACCINE  11/10/2020    There are no preventive care reminders to display for this patient.   Lab  Results  Component Value Date   TSH 2.650 05/14/2020   Lab Results  Component Value Date   WBC 5.6 07/16/2020   HGB 13.5 07/16/2020   HCT 41.8 07/16/2020   MCV 89 07/16/2020   PLT 173 07/16/2020   Lab Results  Component Value Date   NA 139 07/16/2020   K 4.7 07/16/2020   CO2 22 07/16/2020   GLUCOSE 100 (H) 07/16/2020   BUN 14 07/16/2020   CREATININE 0.77 07/16/2020   BILITOT 0.3 07/16/2020   ALKPHOS 78 07/16/2020   AST 17 07/16/2020   ALT 15 07/16/2020   PROT 6.5 07/16/2020   ALBUMIN 4.6 07/16/2020   CALCIUM 9.7 07/16/2020   ANIONGAP 8 05/31/2020   EGFR 75 07/16/2020   Lab Results  Component Value Date   CHOL 230 (H) 07/16/2020   Lab Results  Component Value Date   HDL 89 07/16/2020   Lab Results  Component Value Date   LDLCALC 119 (H) 07/16/2020   Lab Results  Component Value Date   TRIG 131 07/16/2020   Lab Results  Component Value Date   CHOLHDL 2.6 07/16/2020   Lab Results  Component Value Date   HGBA1C 5.8 (H) 11/26/2020       Assessment & Plan:   Maanya was seen today for cyst.  Diagnoses and all orders for this visit:  Internal nasal lesion New referral placed for patient today. Bactroban ordered as she reports  this was somewhat helpful last time.  -     Ambulatory referral to ENT -     mupirocin ointment (BACTROBAN) 2 %; Apply 1 application topically 2 (two) times daily.  Need for immunization against influenza Flu vaccine today in office.    Return for schedule chronic follow up with PCP Hendricks Limes, FNP).  The patient indicates understanding of these issues and agrees with the plan.   Gwenlyn Perking, FNP

## 2020-12-31 NOTE — Patient Instructions (Signed)

## 2021-01-01 ENCOUNTER — Telehealth: Payer: Self-pay | Admitting: Family Medicine

## 2021-01-01 NOTE — Telephone Encounter (Signed)
Pt called stating that she did not want the Mupirocin Ointment called in for her yesterday because it did nothing for her when it was prescribed to her the last time.  Pt would like something else prescribed to her.

## 2021-01-02 NOTE — Telephone Encounter (Signed)
I sent in the mupirocin ointment (Bactroban) as I was under the impression that this was helpful for her symptoms last time. This has good coverage for bacterial nasal lesions, and is the recommended treatment.  I am not aware of another antibiotic ointment that can be used in the nose. I have reviewed the notes and see that she has tried an oral antibiotic without improvement as well, therefore I do not think an oral antibiotic is needed at this time. Another referral has been placed for ENT. I highly recommend that she sees ENT for further evaluation and treatment. In the meantime, I recommend nasal saline to reduce irritation and dryness.

## 2021-01-02 NOTE — Telephone Encounter (Signed)
NA, PHONE KEPT RINGING

## 2021-01-16 ENCOUNTER — Telehealth: Payer: Self-pay | Admitting: Cardiology

## 2021-01-16 NOTE — Telephone Encounter (Signed)
Returned call to patient left message on personal voice mail to call back. 

## 2021-01-16 NOTE — Telephone Encounter (Signed)
  Pt c/o medication issue:  1. Name of Medication: hydrALAZINE (APRESOLINE) 10 MG tablet losartan (COZAAR) 25 MG tablet  2. How are you currently taking this medication (dosage and times per day)?   3. Are you having a reaction (difficulty breathing--STAT)?   4. What is your medication issue? Pt said these meds is not helping to lower her BP. Also she wanted to get result of her VAS [place] LOWER EXT ART SEG MULTI

## 2021-01-20 NOTE — Telephone Encounter (Signed)
Attempts to contact pt without return call in over 3 days, will close encounter. 

## 2021-01-20 NOTE — Telephone Encounter (Signed)
Spoke with pt, she feels the losartan and hydralazine are not controlling her bp very well. Her bp will range from 127/58 to 115/61 in the morning. If her bp is 115 or less she does not take the losartan because she will have dizziness and balance issues. If by noon her bp is elevated, then she will take the losartan. By the evening her bp will elevate to 153 and she will take a hydralazine. She is aware of doppler results. Aware will for to dr hochrein to review and advise.

## 2021-01-22 NOTE — Telephone Encounter (Signed)
Left message for pt to keep the medications the same and to call with concerns.

## 2021-02-03 ENCOUNTER — Ambulatory Visit (INDEPENDENT_AMBULATORY_CARE_PROVIDER_SITE_OTHER): Payer: Medicare Other | Admitting: Family Medicine

## 2021-02-03 ENCOUNTER — Other Ambulatory Visit: Payer: Self-pay

## 2021-02-03 ENCOUNTER — Encounter: Payer: Self-pay | Admitting: Family Medicine

## 2021-02-03 VITALS — BP 144/67 | HR 68 | Temp 97.5°F | Resp 20 | Ht <= 58 in | Wt 131.0 lb

## 2021-02-03 DIAGNOSIS — I10 Essential (primary) hypertension: Secondary | ICD-10-CM

## 2021-02-03 DIAGNOSIS — J3489 Other specified disorders of nose and nasal sinuses: Secondary | ICD-10-CM | POA: Diagnosis not present

## 2021-02-03 DIAGNOSIS — G629 Polyneuropathy, unspecified: Secondary | ICD-10-CM | POA: Diagnosis not present

## 2021-02-03 DIAGNOSIS — N3281 Overactive bladder: Secondary | ICD-10-CM | POA: Diagnosis not present

## 2021-02-03 MED ORDER — LOSARTAN POTASSIUM 50 MG PO TABS: 50.0000 mg | ORAL_TABLET | Freq: Every day | ORAL | 2 refills | Status: DC

## 2021-02-03 NOTE — Progress Notes (Signed)
Assessment & Plan:  1. Essential hypertension Uncontrolled. Losartan increased from 25 mg to 50 mg once daily. Patient to continue monitoring her blood pressure at home, keeping a log, and bring it with her to her next appointment.  2. Overactive bladder Encouraged to try Myrbetriq as previously prescribed. Discussed if she has side effects she is not happy with, she can stop the medication. Education provided on overactive bladder. Discussed pelvic health physical therapy as something for her to consider.   3. Internal nasal lesion Patient allowed to culture her own nose as I do not see any obvious lesions to culture. - Aerobic Culture  4. Neuropathy - Ambulatory referral to Neurology   Return in about 6 weeks (around 03/17/2021) for follow-up of chronic medication conditions.  Hendricks Limes, MSN, APRN, FNP-C [facility]  Subjective:    Patient ID: Heidi Dorsey, female    DOB: May 03, 1930, 87 y.o.   MRN: [id number]  Patient Care Team: Loman Brooklyn, FNP as PCP - General (Family Medicine) Alda Berthold, DO as Consulting Physician (Neurology)   Chief Complaint:  Chief Complaint  Patient presents with   Hypertension    HPI: Heidi Dorsey is a 87 y.o. female presenting on 02/03/2021 for Hypertension  Hypertension Patient monitors her blood pressure at home and reports her readings are consistently elevated over 518 systolic. Her medication lists metoprolol 50 mg daily, Losartan 25 mg daily as needed, and hydralazine 10 mg every 8 hours as needed. Patient states she has not been on metoprolol for more than a year. She is taking Losartan 25 mg every morning and not as needed and report her readings are still elevated, requiring use of hydralazine, but she is concerned about this.  GAD 7 : Generalized Anxiety Score 02/03/2021 12/31/2020  Nervous, Anxious, on Edge 0 0  Control/stop worrying 0 0  Worry too much - different things 0 0  Trouble relaxing  1 1  Restless 1 0  Easily annoyed or irritable 0 0  Afraid - awful might happen 0 1  Total GAD 7 Score 2 2  Anxiety Difficulty Somewhat difficult Somewhat difficult   Depression screen [facility] 2/9 02/03/2021 12/31/2020 09/15/2020  Decreased Interest 0 0 1  Down, Depressed, Hopeless 1 1 1   PHQ - 2 Score 1 1 2   Altered sleeping 3 3 2   Tired, decreased energy 2 2 0  Change in appetite 1 2 2   Feeling bad or failure about yourself  0 0 0  Trouble concentrating 1 0 0  Moving slowly or fidgety/restless 0 1 0  Suicidal thoughts 0 0 0  PHQ-9 Score 8 9 6   Difficult doing work/chores Somewhat difficult Somewhat difficult Somewhat difficult   New complaints: Patient reports she is having trouble sleeping. She has tried melatonin 3 mg at bedtime which has not been very helpful. She does not have trouble falling asleep, but staying asleep due to getting up to urinate 4-5 times during the night. This has been going on for many months. She is urinating small amounts during the night. She states she does not urinate frequently during the day and when she does, it is normal amounts. She does drink a lot of tea, but does not drink liquids after 5-6 PM when she has dinner. She reports a dry mouth at night and feeling tired during the day, but denies snoring. She was previously prescribed myrbetriq and given samples, but has not tried taking the medication yet due to fear of side  effects.   Patient is concerned that her lips are swollen. She feels she has lesions in her nose that are causing swelling of her lips. She was previously referred to an ENT, but did not schedule an appointment. She reports today that he is not associated with a big entity and has his own private practice, so she prefers not to see him. She has a lesion just under her nose cultured in February of this year which grew normal flora. She has also been applying mupirocin ointment, which she does not feel has been helpful.   Patient is upset that  her neurology referral was placed to a new neurologist than the one she saw previously. Upon chart review it appears this was placed by her cardiologist and when the office suggested she see the original neurologist, they were told she did not want to. Patient denies this and would like a new referral placed.    Social history:  Relevant past medical, surgical, family and social history reviewed and updated as indicated. Interim medical history since our last visit reviewed.  Allergies and medications reviewed and updated.  DATA REVIEWED: CHART IN EPIC  ROS: Negative unless specifically indicated above in HPI.    Current Outpatient Medications:    Ascorbic Acid 500 MG CAPS, Take by mouth., Disp: , Rfl:    calcium carbonate (TUMS - DOSED IN MG ELEMENTAL CALCIUM) 500 MG chewable tablet, Chew 1 tablet by mouth as needed for indigestion or heartburn., Disp: , Rfl:    hydrALAZINE (APRESOLINE) 10 MG tablet, Take 1 tablet (10 mg total) by mouth as needed. Take 1 tab every 8 hours as needed, Disp: 90 tablet, Rfl: 1   losartan (COZAAR) 25 MG tablet, Take 1 tablet (25 mg total) by mouth daily as needed., Disp: 90 tablet, Rfl: 3   Multiple Vitamin (MULTI-VITAMIN) tablet, Take by mouth., Disp: , Rfl:    mupirocin ointment (BACTROBAN) 2 %, Apply 1 application topically 2 (two) times daily., Disp: 22 g, Rfl: 0   Omega-3 1000 MG CAPS, Take by mouth., Disp: , Rfl:    Probiotic Product (PROBIOTIC-10 ULTIMATE PO), Take by mouth., Disp: , Rfl:    vitamin B-12 (CYANOCOBALAMIN) 100 MCG tablet, Take 100 mcg by mouth daily., Disp: , Rfl:    metoprolol succinate (TOPROL-XL) 50 MG 24 hr tablet, Take 1 tablet (50 mg total) by mouth daily. Take with or immediately following a meal. (Patient not taking: Reported on 02/03/2021), Disp: 90 tablet, Rfl: 3   mirabegron ER (MYRBETRIQ) 25 MG TB24 tablet, Take 1 tablet (25 mg total) by mouth daily. (Patient not taking: No sig reported), Disp: 30 tablet, Rfl: 2   Allergies   Allergen Reactions   Calcitonin (Salmon) Other (See Comments)    LEFT BUNDLE BRANCH BLOCK   Estradiol Other (See Comments)    Headache   Fexofenadine Other (See Comments)    Syncope   Miacalcin [Calcitonin] Palpitations   Nsaids Other (See Comments)    Stomach ulcer after taking for 3 days in 2009. Negative H. Pylori test.   Diphenhydramine Other (See Comments)    Syncope   Clarithromycin Other (See Comments)    CAN TAKE AZITHROMYCIN   Codeine Other (See Comments)   Doxepin Hcl Other (See Comments)    Heart arrhythmia   Epinephrine Other (See Comments)   Erythromycin Other (See Comments)    Stomach irritation   Fluticasone Other (See Comments)   Fluticasone Propionate Other (See Comments)    And similar corticoid steroids  Ipratropium Bromide Other (See Comments)    Syncope   Lisinopril Other (See Comments)    Other Reaction: BRUISING, CANKERS & ARTHRALGIAS   Other Other (See Comments)    Paraphenylendiamine/propanol amine/anti-cholinergic compounds   Sulfa Antibiotics Other (See Comments)   Tetracycline Other (See Comments)    Mouth sores after just 2 doses. DOXYCYCLINE is OK per patient   Amoxicillin-Pot Clavulanate Rash    Able to take plain amoxicillin   Ciprofloxacin Rash   Latex Rash   Nitrofurantoin Other (See Comments)    Multiple mild symptoms - would prefer not to take again   Past Medical History:  Diagnosis Date   Asthma    BPPV (benign paroxysmal positional vertigo)    Claustrophobia    Diverticulosis    GERD (gastroesophageal reflux disease)    H/O degenerative disc disease    Hiatal hernia    Hyperlipidemia    Hypertension    Kidney stones    Migraines    Osteoarthritis    Caused feet deformity   Osteopenia    Prediabetes    Preglaucoma    Raynauds syndrome    Rectocele    Rosacea    Sciatica    Sleep apnea    Spinal stenosis    Vertebrobasilar artery syndrome     Past Surgical History:  Procedure Laterality Date   ABDOMINAL  HYSTERECTOMY  1978   ANTERIOR (CYSTOCELE) AND POSTERIOR REPAIR (RECTOCELE) WITH XENFORM GRAFT AND SACROSPINOUS FIXATION     BILATERAL OOPHORECTOMY  1997   bladder stones     kidney colic/ procedure performed in 1973 at [facility] Left 12/1987   neg   BREAST BIOPSY Right 02/1977   neg papilloma   CARDIAC CATHETERIZATION  2000   HERNIA REPAIR     plantar fibroma  Left 09/1997   Excised    Social History   Socioeconomic History   Marital status: Single    Spouse name: Not on file   Number of children: Not on file   Years of education: Not on file   Highest education level: Not on file  Occupational History   Occupation: Retired    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    Drinks Half Decaf Coffee and Tea   Originally from [place], then lived in [place] most of her life - moved here 09/2019 - feels out of place, having trouble finding others with similar interests   She is agnostic - not religious (although raised Catholic) - strong scientific beliefs    Social Determinants of Radio broadcast assistant Strain: Medium Risk   Difficulty of Paying Living Expenses: Somewhat hard  Food Insecurity: No Food Insecurity   Worried About Charity fundraiser in the Last Year: Never true   Arboriculturist in the Last Year: Never true  Transportation Needs: No Data processing manager (Medical): No   Lack of Transportation  (Non-Medical): No  Physical Activity: Insufficiently Active   Days of Exercise per Week: 7 days   Minutes of Exercise per Session: 20 min  Stress: No Stress Concern Present   Feeling of Stress : Not at all  Social Connections: Socially Isolated   Frequency of Communication with Friends and Family: More than three times a week   Frequency of Social Gatherings with Friends and Family: Once a week   Attends Religious Services: Never   Marine scientist or Organizations: No   Attends Music therapist: Never   Marital Status: Divorced  Human resources officer Violence: Not At Risk   Fear of Current or Ex-Partner: No   Emotionally Abused: No   Physically Abused: No   Sexually Abused: No        Objective:    BP (!) 144/67   Pulse 68   Temp (!) 97.5 F (36.4 C) (Temporal)   Resp 20   Ht $R'4\' 10"'Av$  (1.473 m)   Wt 131 lb (59.4 kg)   SpO2 98%   BMI 27.38 kg/m   Wt Readings from Last 3 Encounters:  02/03/21 131 lb (59.4 kg)  12/31/20 130 lb 6 oz (59.1 kg)  11/26/20 130 lb (59 kg)    Physical Exam Vitals reviewed.  Constitutional:      General: She is not in acute distress.    Appearance: Normal appearance. She is overweight. She is not ill-appearing, toxic-appearing or diaphoretic.  HENT:     Head: Normocephalic and atraumatic.     Nose: Nose normal.     Mouth/Throat:     Lips: No lesions.     Mouth: Mucous membranes are moist. No injury, lacerations, oral lesions or angioedema.     Tongue: No lesions.     Palate: No mass and lesions.     Pharynx: Oropharynx is clear. No pharyngeal swelling, oropharyngeal exudate, posterior oropharyngeal erythema or uvula swelling.  Eyes:     General: No scleral icterus.       Right eye: No discharge.        Left eye: No discharge.     Conjunctiva/sclera: Conjunctivae normal.  Cardiovascular:     Rate and Rhythm: Normal rate and regular rhythm.     Heart sounds: Normal heart sounds. No murmur heard.   No friction rub. No  gallop.  Pulmonary:     Effort: Pulmonary effort is normal. No respiratory distress.     Breath sounds: Normal breath sounds. No stridor. No wheezing, rhonchi or rales.  Musculoskeletal:        General: Normal range of motion.     Cervical back: Normal range of motion.  Skin:    General: Skin is warm and dry.     Capillary Refill: Capillary refill takes less than 2 seconds.  Neurological:     General: No focal deficit present.     Mental Status: She is alert and oriented to person, place, and time. Mental status is at baseline.  Psychiatric:        Mood and Affect: Mood normal.        Behavior: Behavior normal.        Thought Content: Thought content normal.        Judgment: Judgment normal.    Lab Results  Component Value Date   TSH 2.650 05/14/2020   Lab Results  Component Value Date  WBC 5.6 07/16/2020   HGB 13.5 07/16/2020   HCT 41.8 07/16/2020   MCV 89 07/16/2020   PLT 173 07/16/2020   Lab Results  Component Value Date   NA 139 07/16/2020   K 4.7 07/16/2020   CO2 22 07/16/2020   GLUCOSE 100 (H) 07/16/2020   BUN 14 07/16/2020   CREATININE 0.77 07/16/2020   BILITOT 0.3 07/16/2020   ALKPHOS 78 07/16/2020   AST 17 07/16/2020   ALT 15 07/16/2020   PROT 6.5 07/16/2020   ALBUMIN 4.6 07/16/2020   CALCIUM 9.7 07/16/2020   ANIONGAP 8 05/31/2020   EGFR 75 07/16/2020   Lab Results  Component Value Date   CHOL 230 (H) 07/16/2020   Lab Results  Component Value Date   HDL 89 07/16/2020   Lab Results  Component Value Date   LDLCALC 119 (H) 07/16/2020   Lab Results  Component Value Date   TRIG 131 07/16/2020   Lab Results  Component Value Date   CHOLHDL 2.6 07/16/2020   Lab Results  Component Value Date   HGBA1C 5.8 (H) 11/26/2020

## 2021-02-03 NOTE — Patient Instructions (Signed)
Increase Losartan to 50 mg once daily. You may take 2 of your 25 mg tablets to use them up.

## 2021-02-08 ENCOUNTER — Encounter: Payer: Self-pay | Admitting: Family Medicine

## 2021-02-11 LAB — AEROBIC CULTURE

## 2021-02-16 ENCOUNTER — Encounter: Payer: Self-pay | Admitting: *Deleted

## 2021-02-24 ENCOUNTER — Ambulatory Visit (INDEPENDENT_AMBULATORY_CARE_PROVIDER_SITE_OTHER): Payer: Medicare Other | Admitting: Neurology

## 2021-02-24 ENCOUNTER — Encounter: Payer: Self-pay | Admitting: Neurology

## 2021-02-24 VITALS — BP 166/73 | HR 68 | Ht <= 58 in | Wt 132.0 lb

## 2021-02-24 DIAGNOSIS — R269 Unspecified abnormalities of gait and mobility: Secondary | ICD-10-CM

## 2021-02-24 DIAGNOSIS — R202 Paresthesia of skin: Secondary | ICD-10-CM

## 2021-02-24 DIAGNOSIS — M542 Cervicalgia: Secondary | ICD-10-CM | POA: Diagnosis not present

## 2021-02-24 MED ORDER — ALPRAZOLAM 0.5 MG PO TABS: ORAL_TABLET | ORAL | 0 refills | Status: DC

## 2021-02-24 NOTE — Progress Notes (Signed)
Chief Complaint  Patient presents with   New Patient (Initial Visit)    Rm15, alone, pt states she does not ave PCP, NP/Internal referral for neuropathy/pain in lower extremities      ASSESSMENT AND PLAN  Heidi Dorsey is a 87 y.o. female   Left hands paresthesia, Worsening neck pain  Differentiation diagnosis include left focal neuropathy versus left cervical radiculopathy  MRI of cervical spine, Xanax premedicate for claustrophobia  EMG nerve conduction study   DIAGNOSTIC DATA (LABS, IMAGING, TESTING) - I reviewed patient records, labs, notes, testing and imaging myself where available. Laboratory evaluations in 2022: A1c 5.8, ESR 2, lipid panel, LDL 119, cholesterol 230, normal CBC hemoglobin of 13.5, CMP, creatinine of 0.77, vitamin D of 50.6, normal TSH,  Summary: Right: Resting right ankle-brachial index is within normal range. No evidence of significant right lower extremity arterial disease. The right toe-brachial index is abnormal. Left: Resting left ankle-brachial index is within normal range. No evidence of significant left lower extremity arterial disease. The left toe-brachial index is abnormal.  MEDICAL HISTORY:  Heidi Dorsey is a 87 year old female, seen in request by Dr. Minus Breeding, for evaluation of left hand paresthesia worsening left neck pain  I reviewed and summarized the referring note.  Past medical history Hypertension CAD  She has severe scoliosis, mild gait abnormality, chronic neck, low back pain,  Around May 2022, she began to noticed left hand numbness tingling, difficulty turning pages on her book, difficulty holding a fine piece of paper, also difficulty buttoning, she complains of left neck radiating pain to left shoulder left arm, coldness sensation of her left hand, often have to sit on it to make her feel better  She denies significant bowel and bladder incontinence, denies significant right hand involvement,  PHYSICAL EXAM:    Vitals:   02/24/21 1356  BP: (!) 166/73  Pulse: 68  Weight: 132 lb (59.9 kg)  Height: $Remove'4\' 10"'jEjQpPp$  (1.473 m)   Not recorded     Body mass index is 27.59 kg/m.  PHYSICAL EXAMNIATION:  Gen: NAD, conversant, well nourised, well groomed                     Cardiovascular: Regular rate rhythm, no peripheral edema, warm, nontender. Eyes: Conjunctivae clear without exudates or hemorrhage Neck: Supple, no carotid bruits. Pulmonary: Clear to auscultation bilaterally   NEUROLOGICAL EXAM:  MENTAL STATUS: Speech:    Speech is normal; fluent and spontaneous with normal comprehension.  Cognition:     Orientation to time, place and person     Normal recent and remote memory     Normal Attention span and concentration     Normal Language, naming, repeating,spontaneous speech     Fund of knowledge   CRANIAL NERVES: CN II: Visual fields are full to confrontation. Pupils are round equal and briskly reactive to light. CN III, IV, VI: extraocular movement are normal. No ptosis. CN V: Facial sensation is intact to light touch CN VII: Face is symmetric with normal eye closure  CN VIII: Hearing is normal to causal conversation. CN IX, X: Phonation is normal. CN XI: Head turning and shoulder shrug are intact  MOTOR: There was no significant upper extremity muscle weakness noted, including bilateral abductor pollicis brevis, opponens, no significant upper extremity proximal muscle weakness, deformity of bilateral foot, collapse of bilateral arch, no significant proximal weakness  REFLEXES: Reflexes are 1 and symmetric at the biceps, triceps, knees, and ankles. Plantar responses are flexor.  SENSORY:  Decreased to pinprick at first 3 finger pads of both hands,  COORDINATION: There is no trunk or limb dysmetria noted.  GAIT/STANCE: She needs push-up to get up from seated position, scoliosis, antalgic, cautious, unsteady  REVIEW OF SYSTEMS:  Full 14 system review of systems performed and  notable only for as above All other review of systems were negative.   ALLERGIES: Allergies  Allergen Reactions   Calcitonin ([place]mon) Other (See Comments)    LEFT BUNDLE BRANCH BLOCK   Estradiol Other (See Comments)    Headache   Fexofenadine Other (See Comments)    Syncope   Miacalcin [Calcitonin] Palpitations   Nsaids Other (See Comments)    Stomach ulcer after taking for 3 days in 2009. Negative H. Pylori test.   Diphenhydramine Other (See Comments)    Syncope   Clarithromycin Other (See Comments)    CAN TAKE AZITHROMYCIN   Codeine Other (See Comments)   Doxepin Hcl Other (See Comments)    Heart arrhythmia   Epinephrine Other (See Comments)   Erythromycin Other (See Comments)    Stomach irritation   Fluticasone Other (See Comments)   Fluticasone Propionate Other (See Comments)    And similar corticoid steroids   Ipratropium Bromide Other (See Comments)    Syncope   Lisinopril Other (See Comments)    Other Reaction: BRUISING, CANKERS & ARTHRALGIAS   Other Other (See Comments)    Paraphenylendiamine/propanol amine/anti-cholinergic compounds   Sulfa Antibiotics Other (See Comments)   Tetracycline Other (See Comments)    Mouth sores after just 2 doses. DOXYCYCLINE is OK per patient   Amoxicillin-Pot Clavulanate Rash    Able to take plain amoxicillin   Ciprofloxacin Rash   Latex Rash   Nitrofurantoin Other (See Comments)    Multiple mild symptoms - would prefer not to take again    HOME MEDICATIONS: Current Outpatient Medications  Medication Sig Dispense Refill   Ascorbic Acid 500 MG CAPS Take by mouth.     calcium carbonate (TUMS - DOSED IN MG ELEMENTAL CALCIUM) 500 MG chewable tablet Chew 1 tablet by mouth as needed for indigestion or heartburn.     hydrALAZINE (APRESOLINE) 10 MG tablet Take 1 tablet (10 mg total) by mouth as needed. Take 1 tab every 8 hours as needed 90 tablet 1   losartan (COZAAR) 25 MG tablet Take 25 mg by mouth daily.     mirabegron ER  (MYRBETRIQ) 25 MG TB24 tablet Take 1 tablet (25 mg total) by mouth daily. 30 tablet 2   Multiple Vitamin (MULTI-VITAMIN) tablet Take by mouth.     mupirocin ointment (BACTROBAN) 2 % Apply 1 application topically 2 (two) times daily. 22 g 0   Omega-3 1000 MG CAPS Take by mouth.     Probiotic Product (PROBIOTIC-10 ULTIMATE PO) Take by mouth.     vitamin B-12 (CYANOCOBALAMIN) 100 MCG tablet Take 100 mcg by mouth daily.     No current facility-administered medications for this visit.    PAST MEDICAL HISTORY: Past Medical History:  Diagnosis Date   Asthma    BPPV (benign paroxysmal positional vertigo)    Claustrophobia    Diverticulosis    GERD (gastroesophageal reflux disease)    H/O degenerative disc disease    Hiatal hernia    Hyperlipidemia    Hypertension    Kidney stones    Migraines    Osteoarthritis    Caused feet deformity   Osteopenia    Prediabetes    Preglaucoma    Raynauds syndrome  Rectocele    Rosacea    Sciatica    Sleep apnea    Spinal stenosis    Vertebrobasilar artery syndrome     PAST SURGICAL HISTORY: Past Surgical History:  Procedure Laterality Date   ABDOMINAL HYSTERECTOMY  1978   ANTERIOR (CYSTOCELE) AND POSTERIOR REPAIR (RECTOCELE) WITH XENFORM GRAFT AND SACROSPINOUS FIXATION     BILATERAL OOPHORECTOMY  1997   bladder stones     kidney colic/ procedure performed in 1973 at [facility] Left 12/1987   neg   BREAST BIOPSY Right 02/1977   neg papilloma   CARDIAC CATHETERIZATION  2000   HERNIA REPAIR     plantar fibroma  Left 09/1997   Excised    FAMILY HISTORY: Family History  Problem Relation Age of Onset   Osteoporosis Mother    Ovarian cancer Mother    Uterine cancer Mother    Brain cancer Father    Bladder Cancer Brother    Rectal cancer Brother    Skin cancer Brother    Osteoporosis Sister    Diabetes Paternal Grandmother    Breast cancer Neg Hx     SOCIAL HISTORY: Social History    Socioeconomic History   Marital status: Single    Spouse name: Not on file   Number of children: Not on file   Years of education: Not on file   Highest education level: Not on file  Occupational History   Occupation: Retired    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    Drinks Half Decaf Coffee and Tea   Originally from [place], then lived in [place] most of her life - moved here 09/2019 - feels out of place, having trouble finding others with similar interests   She is agnostic - not religious (although raised Catholic) - strong scientific beliefs    Social Determinants of Radio broadcast assistant Strain: Medium Risk   Difficulty of Paying Living Expenses: Somewhat hard  Food Insecurity: No Food Insecurity   Worried About Charity fundraiser in the Last Year: Never true   Arboriculturist in the Last Year: Never true  Transportation Needs: No Transportation Needs   Lack of Transportation (Medical): No   Lack of Transportation (Non-Medical): No  Physical Activity: Insufficiently Active   Days of Exercise per Week: 7 days   Minutes of Exercise per Session: 20 min  Stress: No Stress Concern Present   Feeling of Stress : Not at all  Social Connections: Socially Isolated   Frequency of Communication with Friends and Family: More than three times a week   Frequency of Social Gatherings with Friends and Family: Once a week   Attends Religious Services: Never   Corporate treasurer or Organizations: No   Attends Archivist Meetings: Never   Marital  Status: Divorced  Human resources officer Violence: Not At Risk   Fear of Current or Ex-Partner: No   Emotionally Abused: No   Physically Abused: No   Sexually Abused: No      Marcial Pacas, M.D. Ph.D.  [facility] [address] [place], [place] [address] Ph: [phone] Fax: [phone]  CC:  Minus Breeding, [place] [employer] [place],  [place] [address]  Loman Brooklyn, FNP

## 2021-03-11 ENCOUNTER — Telehealth: Payer: Self-pay | Admitting: Neurology

## 2021-03-11 NOTE — Telephone Encounter (Signed)
Medicare/[facility] auth: [employer] spoke to Barbara Cower ref # [id number] order sent to [facility], they will reach out to the patient to schedule.

## 2021-03-13 ENCOUNTER — Ambulatory Visit: Payer: Medicare Other | Admitting: Cardiology

## 2021-03-13 ENCOUNTER — Ambulatory Visit: Payer: Medicare Other | Admitting: Family Medicine

## 2021-03-18 ENCOUNTER — Ambulatory Visit: Payer: Medicare Other | Admitting: Family Medicine

## 2021-03-31 ENCOUNTER — Ambulatory Visit: Payer: Medicare Other | Admitting: Family Medicine

## 2021-04-13 NOTE — Progress Notes (Deleted)
Cardiology Office Note   Date:  04/13/2021   ID:  Heidi Dorsey, Heidi Dorsey [birth year], MRN [id number]  PCP:  Loman Brooklyn, FNP  Cardiologist:   None Referring:  Loman Brooklyn, FNP  No chief complaint on file.      History of Present Illness: Heidi Dorsey is a 88 y.o. female who presents for evaluation of palpitations.    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  She had a long history of left bundle branch block.  She has had a cardiac catheterization in 2000.  She does not remember the results but was not told that she had vascular disease.  I see that she has had carotid Dopplers which were unremarkable.  She had a very mildly reduced ejection fraction on the stress echo in 2017 with her EF being 50%.  She has had a 24-hour Holter.  She previously has been treated with metoprolol.  Most recently she was on Cozaar 12.5 mg daily but this was discontinued because her blood pressures were running low.   She also called back and she was had an eye bleed on ARB so she did not want to start this.  She has also been intolerant of amlodipine.  She cannot take ACE inhibitors.  We have tried to use hydralazine PRN.  She did not want to take this because she had read that it caused asthma.    ***  ***  She seemed to be tolerating beta blocker so at the last visit I was prescribing this.  I had suggested hydralazine PRN SBP greater than 160.  She called back because she did not want to take the hydralazine because she read it would worsen her asthma.    When we spoke to her in June she was taking Losartan in the AM and metoprolol in the evening.  She was back to taking hydralazine but because a pharmacist told her it did not make sense to take it PRN she was taking it daily.  She did not want to be seen in the HTN clinic.  I tried to schedule her to see me in [place] as there were no appts in [place] and she would not do this.  Her blood pressure has been labile.     She has complaints.  We reviewed what she thinks is neuropathic pain in her feet and her hands.  She has difficulty sleeping.  Her blood pressure is very labile and she does not feel well when this 110.  She is taking the full Cozaar 25 mg right now.  She is back to using the hydralazine as needed.  She apparently is taking the metoprolol.  She is not having any presyncope or syncope.  She uses a walker for balance.  She is not having any new chest pressure, neck or arm discomfort.  She has had no weight gain or edema.  Past Medical History:  Diagnosis Date   Asthma    BPPV (benign paroxysmal positional vertigo)    Claustrophobia    Diverticulosis    GERD (gastroesophageal reflux disease)    H/O degenerative disc disease    Hiatal hernia    Hyperlipidemia    Hypertension    Kidney stones    Migraines    Osteoarthritis    Caused feet deformity   Osteopenia    Prediabetes    Preglaucoma    Raynauds syndrome    Rectocele    Rosacea  Sciatica    Sleep apnea    Spinal stenosis    Vertebrobasilar artery syndrome     Past Surgical History:  Procedure Laterality Date   ABDOMINAL HYSTERECTOMY  1978   ANTERIOR (CYSTOCELE) AND POSTERIOR REPAIR (RECTOCELE) WITH XENFORM GRAFT AND SACROSPINOUS FIXATION     BILATERAL OOPHORECTOMY  1997   bladder stones     kidney colic/ procedure performed in 1973 at [facility] Left 12/1987   neg   BREAST BIOPSY Right 02/1977   neg papilloma   CARDIAC CATHETERIZATION  2000   HERNIA REPAIR     plantar fibroma  Left 09/1997   Excised     Current Outpatient Medications  Medication Sig Dispense Refill   ALPRAZolam (XANAX) 0.5 MG tablet Take 1-2 tablets 30 minutes prior to MRI, may repeat once as needed. Must have driver. 3 tablet 0   Ascorbic Acid 500 MG CAPS Take by mouth.     calcium carbonate (TUMS - DOSED IN MG ELEMENTAL CALCIUM) 500 MG chewable tablet Chew 1 tablet by mouth as needed for indigestion or  heartburn.     hydrALAZINE (APRESOLINE) 10 MG tablet Take 1 tablet (10 mg total) by mouth as needed. Take 1 tab every 8 hours as needed 90 tablet 1   losartan (COZAAR) 25 MG tablet Take 25 mg by mouth daily.     mirabegron ER (MYRBETRIQ) 25 MG TB24 tablet Take 1 tablet (25 mg total) by mouth daily. 30 tablet 2   Multiple Vitamin (MULTI-VITAMIN) tablet Take by mouth.     mupirocin ointment (BACTROBAN) 2 % Apply 1 application topically 2 (two) times daily. 22 g 0   Omega-3 1000 MG CAPS Take by mouth.     Probiotic Product (PROBIOTIC-10 ULTIMATE PO) Take by mouth.     vitamin B-12 (CYANOCOBALAMIN) 100 MCG tablet Take 100 mcg by mouth daily.     No current facility-administered medications for this visit.    Allergies:   Calcitonin (salmon), Estradiol, Fexofenadine, Miacalcin [calcitonin], Nsaids, Diphenhydramine, Clarithromycin, Codeine, Doxepin hcl, Epinephrine, Erythromycin, Fluticasone, Fluticasone propionate, Ipratropium bromide, Lisinopril, Other, Sulfa antibiotics, Tetracycline, Amoxicillin-pot clavulanate, Ciprofloxacin, Latex, and Nitrofurantoin    ROS:  Please see the history of present illness.   Otherwise, review of systems are positive for ***.   All other systems are reviewed and negative.    PHYSICAL EXAM: VS:  There were no vitals taken for this visit. , BMI There is no height or weight on file to calculate BMI. GENERAL:  Well appearing NECK:  No jugular venous distention, waveform within normal limits, carotid upstroke brisk and symmetric, no bruits, no thyromegaly LUNGS:  Clear to auscultation bilaterally CHEST:  Unremarkable HEART:  PMI not displaced or sustained,S1 and S2 within normal limits, no S3, no S4, no clicks, no rubs, *** murmurs ABD:  Flat, positive bowel sounds normal in frequency in pitch, no bruits, no rebound, no guarding, no midline pulsatile mass, no hepatomegaly, no splenomegaly EXT:  2 plus pulses throughout, no edema, no cyanosis no  clubbing    ***GENERAL:  Well appearing NECK:  No jugular venous distention, waveform within normal limits, carotid upstroke brisk and symmetric, no bruits, no thyromegaly LUNGS:  Clear to auscultation bilaterally CHEST:  Unremarkable HEART:  PMI not displaced or sustained,S1 and S2 within normal limits, no S3, no S4, no clicks, no rubs, no murmurs ABD:  Flat, positive bowel sounds normal in frequency in pitch, no bruits, no rebound, no guarding,  no midline pulsatile mass, no hepatomegaly, no splenomegaly EXT:  2 plus pulses upper and mildly diminished dorsalis pedis and posterior tibialis bilateral, no edema, no cyanosis no clubbing   EKG:  EKG is *** ordered today. ***  Recent Labs: 05/14/2020: TSH 2.650 07/16/2020: ALT 15; BUN 14; Creatinine, Ser 0.77; Hemoglobin 13.5; Platelets 173; Potassium 4.7; Sodium 139    Lipid Panel    Component Value Date/Time   CHOL 230 (H) 07/16/2020 1227   TRIG 131 07/16/2020 1227   HDL 89 07/16/2020 1227   CHOLHDL 2.6 07/16/2020 1227   LDLCALC 119 (H) 07/16/2020 1227      Wt Readings from Last 3 Encounters:  02/24/21 132 lb (59.9 kg)  02/03/21 131 lb (59.4 kg)  12/31/20 130 lb 6 oz (59.1 kg)      Other studies Reviewed: Additional studies/ records that were reviewed today include: *** Review of the above records demonstrates:  ***   ASSESSMENT AND PLAN:  PALPITATIONS:    ***  She is really not particular bothered by these.  No change in therapy.  HTN:       Her blood pressure is *** labile.  We had another long discussion about this.  I tried to outline for her therapy where she can take a half of the valsartan in the morning if her blood pressure is low.  He gets tired perhaps around 140 she can take 25 mg.  If she takes a half of the losartan later in the day her blood pressure is elevated she can take the other half.  I have suggested that she use the hydralazine if her systolics are 123XX123 or 99991111 and that she could use it every 8 hours if  she needed.  She can continue on the beta-blocker.  LBBB:  ***  This has been chronic and she had an extensive work-up.  No change in therapy.  INSOMNIA:  ***  I have referred her to neurology as below.  NEUROPATHY:   ***  I have suggested possibly benfotiamine OTC.  I think she would be very sensitive to any other medications but again she can talk this over with her neurologist. I will check A1c  LEG PAIN: I suggested ABIs.  *** will measure ABIs although I doubt that this is vascular I think she is probably having neuropathic pain.  Current medicines are reviewed at length with the patient today.  The patient does not have concerns regarding medicines.  The following changes have been made: ***  Labs/ tests ordered today include: ***  No orders of the defined types were placed in this encounter.   Disposition:   FU with *** months   Signed, Minus Breeding, MD  04/13/2021 9:20 PM    [facility]

## 2021-04-15 ENCOUNTER — Ambulatory Visit: Payer: Medicare Other | Admitting: Cardiology

## 2021-04-15 DIAGNOSIS — I10 Essential (primary) hypertension: Secondary | ICD-10-CM

## 2021-04-15 DIAGNOSIS — R002 Palpitations: Secondary | ICD-10-CM

## 2021-04-15 DIAGNOSIS — M79606 Pain in leg, unspecified: Secondary | ICD-10-CM

## 2021-04-15 DIAGNOSIS — G629 Polyneuropathy, unspecified: Secondary | ICD-10-CM

## 2021-04-15 DIAGNOSIS — I447 Left bundle-branch block, unspecified: Secondary | ICD-10-CM

## 2021-04-28 ENCOUNTER — Telehealth: Payer: Self-pay | Admitting: Cardiology

## 2021-04-28 NOTE — Telephone Encounter (Signed)
Nurse was speaking to pt regarding elevated BP but phone line got disconnected. Nurse attempted to contact pt but line is ringing busy

## 2021-04-28 NOTE — Telephone Encounter (Signed)
Pt c/o BP issue: STAT if pt c/o blurred vision, one-sided weakness or slurred speech  1. What are your last 5 BP readings? 1/17  7:56am 168/70 HR 61 1/16  440pm 172/68 HR 60 1/16  8AM 159/70 HR 60 1/15  2pm 166/69 HR 66 1/15  7:30am 176/74 HR 56   2. Are you having any other symptoms (ex. Dizziness, headache, blurred vision, passed out)? NO  3. What is your BP issue? Elevated BP

## 2021-05-01 NOTE — Telephone Encounter (Signed)
Spoke with patient who reports the following bp.  1/17  7:56am   168/70 HR 61 1/16  440pm    172/68 HR 60 1/16  8AM        159/70 HR 60 1/15  2pm        166/69 HR 66 1/15  7:30am   176/74 HR 56  Patient states since October her bp has been elevated. She recently moved and didn't have PCP and saw FNPs. While on the phone her bp 170/69, p 72. She took losartan around 6 am. I told her to take her prn apresoline 10 mg now (she took one yesterday afternoon). She reports feeling "foggy and having blurred vision, with difficulty reading." She stated she saw Hendricks Limes, FNP in October who recommended patient increase losartan from 25 mg to 50 mg. Patient stated she checked with pharmacist and read the insert that she should not double her dose, so she has never increased the losartan. She also reports an itchy rash on the backs of her hands along with blisters and bruising under the skin, and losing her voice. She denies headache, neck or chest pain/discomfort. Was advised to go to the ER if the apresoline did not bring down her BP. Informed her I would send this note to Dr. Percival Spanish. She has an appointment with Dr. Warren Lacy on 2/9. Patient verbalized understanding of this conversation.

## 2021-05-04 ENCOUNTER — Other Ambulatory Visit: Payer: Self-pay

## 2021-05-04 ENCOUNTER — Encounter: Payer: Self-pay | Admitting: *Deleted

## 2021-05-04 MED ORDER — LOSARTAN POTASSIUM 50 MG PO TABS: 50.0000 mg | ORAL_TABLET | Freq: Every day | ORAL | 11 refills | Status: DC

## 2021-05-04 NOTE — Telephone Encounter (Signed)
Cozaar 25 mg discontinued for dose change. Order placed for Cozaar 50 mg daily.

## 2021-05-04 NOTE — Telephone Encounter (Signed)
This encounter was created in error - please disregard.

## 2021-05-04 NOTE — Telephone Encounter (Signed)
-----   Message from Minus Breeding, MD sent at 05/04/2021  1:04 PM EST ----- Increase losartan to 25 mg bid.

## 2021-05-06 ENCOUNTER — Encounter: Payer: Medicare Other | Admitting: Neurology

## 2021-05-11 ENCOUNTER — Ambulatory Visit: Payer: Medicare Other | Admitting: Family Medicine

## 2021-05-15 ENCOUNTER — Encounter: Payer: Self-pay | Admitting: Family Medicine

## 2021-05-20 ENCOUNTER — Other Ambulatory Visit: Payer: Self-pay

## 2021-05-20 ENCOUNTER — Ambulatory Visit (INDEPENDENT_AMBULATORY_CARE_PROVIDER_SITE_OTHER): Payer: Medicare Other | Admitting: Cardiology

## 2021-05-20 ENCOUNTER — Encounter: Payer: Self-pay | Admitting: Cardiology

## 2021-05-20 VITALS — BP 148/66 | HR 69 | Wt 134.8 lb

## 2021-05-20 DIAGNOSIS — R002 Palpitations: Secondary | ICD-10-CM

## 2021-05-20 DIAGNOSIS — I10 Essential (primary) hypertension: Secondary | ICD-10-CM | POA: Diagnosis not present

## 2021-05-20 NOTE — Progress Notes (Signed)
Cardiology Office Note   Date:  05/20/2021   ID:  Nakyia, Dau 1930/05/03, MRN [id number]  PCP:  Gwenlyn Fudge, FNP  Cardiologist:   None Referring:  Gwenlyn Fudge, FNP  Chief Complaint  Patient presents with   Hypertension            History of Present Illness: Heidi Dorsey is a 88 y.o. female who presents for evaluation of palpitations.    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  She had a long history of left bundle branch block.  She has had a cardiac catheterization in 2000.  She does not remember the results but was not told that she had vascular disease.  I see that she has had carotid Dopplers which were unremarkable.  She had a very mildly reduced ejection fraction on the stress echo in 2017 with her EF being 50%.  She has had a 24-hour Holter.  She previously has been treated with metoprolol.  Most recently she was on Cozaar 12.5 mg daily but this was discontinued because her blood pressures were running low.   She also called back and she was had an eye bleed on ARB so she did not want to start this.  She has also been intolerant of amlodipine.  She cannot take ACE inhibitors.  She seemed to be tolerating beta blocker so at the last visit I was prescribing this.  I had suggested hydralazine PRN SBP greater than 160.  She called back because she did not want to take the hydralazine because she read it would worsen her asthma.    When we spoke to her in June she was taking Losartan in the AM and metoprolol in the evening.  She was back to taking hydralazine but because a pharmacist told her it did not make sense to take it PRN she was taking it daily.  She did not want to be seen in the HTN clinic.  I tried to schedule her to see me in [place] as there were no appts in [place] and she would not do this.  Her blood pressure has been labile.    She was initially calling to say that she did not think the Cozaar was working but in the  office today her blood pressures been pretty well controlled.  It is typically in the 140s systolic.  She brings me her blood pressure diary.  She has hydralazine which she really rarely needs to use if her blood pressure goes above 150.  She actually seems to be doing well with this regimen.  She has no acute complaints.   Past Medical History:  Diagnosis Date   Asthma    BPPV (benign paroxysmal positional vertigo)    Claustrophobia    Diverticulosis    GERD (gastroesophageal reflux disease)    H/O degenerative disc disease    Hiatal hernia    Hyperlipidemia    Hypertension    Kidney stones    Migraines    Osteoarthritis    Caused feet deformity   Osteopenia    Prediabetes    Preglaucoma    Raynauds syndrome    Rectocele    Rosacea    Sciatica    Sleep apnea    Spinal stenosis    Vertebrobasilar artery syndrome     Past Surgical History:  Procedure Laterality Date   ABDOMINAL HYSTERECTOMY  1978   ANTERIOR (CYSTOCELE) AND POSTERIOR REPAIR (RECTOCELE) WITH XENFORM GRAFT AND  SACROSPINOUS FIXATION     BILATERAL OOPHORECTOMY  1997   bladder stones     kidney colic/ procedure performed in 1973 at [facility]   BLADDER SUSPENSION     BREAST BIOPSY Left 12/1987   neg   BREAST BIOPSY Right 02/1977   neg papilloma   CARDIAC CATHETERIZATION  2000   HERNIA REPAIR     plantar fibroma  Left 09/1997   Excised     Current Outpatient Medications  Medication Sig Dispense Refill   Ascorbic Acid 500 MG CAPS Take by mouth.     calcium carbonate (TUMS - DOSED IN MG ELEMENTAL CALCIUM) 500 MG chewable tablet Chew 1 tablet by mouth as needed for indigestion or heartburn.     hydrALAZINE (APRESOLINE) 10 MG tablet Take 1 tablet (10 mg total) by mouth as needed. Take 1 tab every 8 hours as needed 90 tablet 1   losartan (COZAAR) 50 MG tablet Take 1 tablet (50 mg total) by mouth daily. 30 tablet 11   Multiple Vitamin (MULTI-VITAMIN) tablet Take by mouth.     mupirocin ointment (BACTROBAN) 2 %  Apply 1 application topically 2 (two) times daily. 22 g 0   Omega-3 1000 MG CAPS Take by mouth.     Probiotic Product (PROBIOTIC-10 ULTIMATE PO) Take by mouth.     vitamin B-12 (CYANOCOBALAMIN) 100 MCG tablet Take 100 mcg by mouth daily.     ALPRAZolam (XANAX) 0.5 MG tablet Take 1-2 tablets 30 minutes prior to MRI, may repeat once as needed. Must have driver. (Patient not taking: Reported on 05/20/2021) 3 tablet 0   mirabegron ER (MYRBETRIQ) 25 MG TB24 tablet Take 1 tablet (25 mg total) by mouth daily. (Patient not taking: Reported on 05/20/2021) 30 tablet 2   No current facility-administered medications for this visit.    Allergies:   Calcitonin (salmon), Estradiol, Fexofenadine, Miacalcin [calcitonin], Nsaids, Diphenhydramine, Clarithromycin, Codeine, Doxepin hcl, Epinephrine, Erythromycin, Fluticasone, Fluticasone propionate, Ipratropium bromide, Lisinopril, Other, Sulfa antibiotics, Tetracycline, Amoxicillin-pot clavulanate, Ciprofloxacin, Latex, and Nitrofurantoin    ROS:  Please see the history of present illness.   Otherwise, review of systems are positive for none.   All other systems are reviewed and negative.    PHYSICAL EXAM: VS:  BP (!) 148/66 (BP Location: Left Arm, Patient Position: Sitting, Cuff Size: Normal)    Pulse 69    Wt 134 lb 12.8 oz (61.1 kg)    SpO2 99%    BMI 28.17 kg/m  , BMI Body mass index is 28.17 kg/m. GENERAL:  Well appearing NECK:  No jugular venous distention, waveform within normal limits, carotid upstroke brisk and symmetric, no bruits, no thyromegaly LUNGS:  Clear to auscultation bilaterally CHEST:  Unremarkable HEART:  PMI not displaced or sustained,S1 and S2 within normal limits, no S3, no S4, no clicks, no rubs, no murmurs ABD:  Flat, positive bowel sounds normal in frequency in pitch, no bruits, no rebound, no guarding, no midline pulsatile mass, no hepatomegaly, no splenomegaly EXT:  2 plus pulses throughout, no edema, no cyanosis no  clubbing    EKG:  EKG is   ordered today. Sinus rhythm, rate 69, axis within normal limits, left bundle branch block, no change from previous  Recent Labs: 07/16/2020: ALT 15; BUN 14; Creatinine, Ser 0.77; Hemoglobin 13.5; Platelets 173; Potassium 4.7; Sodium 139    Lipid Panel    Component Value Date/Time   CHOL 230 (H) 07/16/2020 1227   TRIG 131 07/16/2020 1227   HDL 89 07/16/2020 1227  CHOLHDL 2.6 07/16/2020 1227   LDLCALC 119 (H) 07/16/2020 1227      Wt Readings from Last 3 Encounters:  05/20/21 134 lb 12.8 oz (61.1 kg)  02/24/21 132 lb (59.9 kg)  02/03/21 131 lb (59.4 kg)      Other studies Reviewed: Additional studies/ records that were reviewed today include: none  Review of the above records demonstrates:  NA   ASSESSMENT AND PLAN:  PALPITATIONS:    She is not particular bothered by these.  No change in therapy.   HTN:     The blood pressure seems to be finally well controlled she seems to be satisfied with this regimen and plan.  She will continue to check her blood pressure at home.  No change in therapy.  LBBB:    She had a previous extensive work-up.  This is chronic Current medicines are reviewed at length with the patient today.  The patient does not have concerns regarding medicines.  The following changes have been made: None  Labs/ tests ordered today include: None  Orders Placed This Encounter  Procedures   EKG 12-Lead      Disposition:   FU with 6 months   Signed, Rollene Rotunda, MD  05/20/2021 2:19 PM    [facility]

## 2021-05-20 NOTE — Patient Instructions (Signed)
Medication Instructions:  The current medical regimen is effective;  continue present plan and medications.  *If you need a refill on your cardiac medications before your next appointment, please call your pharmacy*  Follow-Up: At [facility], you and your health needs are our priority.  As part of our continuing mission to provide you with exceptional heart care, we have created designated Provider Care Teams.  These Care Teams include your primary Cardiologist (physician) and Advanced Practice Providers (APPs -  Physician Assistants and Nurse Practitioners) who all work together to provide you with the care you need, when you need it.  We recommend signing up for the patient portal called "MyChart".  Sign up information is provided on this After Visit Summary.  MyChart is used to connect with patients for Virtual Visits (Telemedicine).  Patients are able to view lab/test results, encounter notes, upcoming appointments, etc.  Non-urgent messages can be sent to your provider as well.   To learn more about what you can do with MyChart, go to [email or web address].    Your next appointment:   6 month(s)  The format for your next appointment:   In Person  Provider:   James Hochrein, MD   Thank you for choosing [facility]!!     

## 2021-05-21 ENCOUNTER — Ambulatory Visit: Payer: Medicare Other | Admitting: Cardiology

## 2021-05-29 ENCOUNTER — Other Ambulatory Visit: Payer: Self-pay

## 2021-05-29 ENCOUNTER — Telehealth: Payer: Self-pay | Admitting: Cardiology

## 2021-05-29 NOTE — Telephone Encounter (Signed)
°  Pt c/o medication issue:  1. Name of Medication:   losartan (COZAAR) 50 MG tablet  2. How are you currently taking this medication (dosage and times per day)?   Take 1 tablet (50 mg total) by mouth daily.  3. Are you having a reaction (difficulty breathing--STAT)?  NO  4. What is your medication issue?  Patient states that her blood pressure is still running high and she feels that the Losartan is not working. Her BP today was 162/69. She would like to discuss med adjustment.

## 2021-05-29 NOTE — Telephone Encounter (Signed)
Spoke with patient who reports that Tuesday, her BP was 136/58 yesterday 151/65, this morning 162/69. She takes hydralazine every 8 hours as needed. She believes the losartan 50 mg is not working for lower her BP. Also, she reports not sleeping well due to spinal problems. I recommended that she speak with her PCP. Her concern is medication to lower BP.

## 2021-06-01 MED ORDER — LOSARTAN POTASSIUM 25 MG PO TABS: 25.0000 mg | ORAL_TABLET | Freq: Every day | ORAL | 3 refills | Status: DC

## 2021-06-01 NOTE — Telephone Encounter (Signed)
Spoke with pt, aware of dr hochrein's recommendations. She is also complaining of issues with hair loss and swelling of her face. She wanted to be seen. Follow up scheduled with dr hochrein in [place].

## 2021-06-01 NOTE — Progress Notes (Signed)
Cardiology Office Note   Date:  06/03/2021   ID:  Heidi Dorsey, Heidi Dorsey 1930/08/24, MRN [id number]  PCP:  Heidi Fudge, FNP  Cardiologist:   None Referring:  Heidi Fudge, FNP  Chief Complaint  Patient presents with   Hypertension       History of Present Illness: Heidi Dorsey is a 88 y.o. female who presents for evaluation of palpitations.    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  She had a long history of left bundle branch block.  She has had a cardiac catheterization in 2000.  She does not remember the results but was not told that she had vascular disease.  I see that she has had carotid Dopplers which were unremarkable.  She had a very mildly reduced ejection fraction on the stress echo in 2017 with her EF being 50%.  She has had a 24-hour Holter.  She previously has been treated with metoprolol.  Most recently she was on Cozaar 12.5 mg daily but this was discontinued because her blood pressures were running low.   She also called back and she was had an eye bleed on ARB so she did not want to start this.  She has also been intolerant of amlodipine.  She cannot take ACE inhibitors.  She seemed to be tolerating beta blocker so at the last visit I was prescribing this.  I had suggested hydralazine PRN SBP greater than 160.  She called back because she did not want to take the hydralazine because she read it would worsen her asthma.    When we spoke to her in June she was taking Losartan in the AM and metoprolol in the evening.  She was back to taking hydralazine but because a pharmacist told her it did not make sense to take it PRN she was taking it daily.  She did not want to be seen in the HTN clinic.  I tried to schedule her to see me in [place] as there were no appts in [place] and she would not do this.  Her blood pressure has been labile.    She was initially calling to say that she did not think the Cozaar was working but in the office  last week her blood pressures have been pretty well controlled.   However, she called again late last week to consider blood pressures were in the 160s.   She had other issues including hair loss and swelling of her face.  She has some bruising in her hands.  She wanted to be seen.  She wanted medication change and was not happy about using the hydralazine as much but when I suggested over the phone taking an extra 25 mg of Cozaar in the evening she did not want to start this either.  She comes in to be seen and she has no acute cardiovascular complaints.  Her blood pressure is labile but there are no significant extremes.  She manages her self with the as needed hydralazine I think better than she was previously.  He has not had any presyncope or syncope.  She has ectopy but does not feel any palpitations.  She is complaining of not under her nose where her glasses hit.  She has some bruising on her right hand but does not remember hitting anything.  She has not otherwise had any bleeding issues.   Past Medical History:  Diagnosis Date   Asthma    BPPV (benign  paroxysmal positional vertigo)    Claustrophobia    Diverticulosis    GERD (gastroesophageal reflux disease)    H/O degenerative disc disease    Hiatal hernia    Hyperlipidemia    Hypertension    Kidney stones    Migraines    Osteoarthritis    Caused feet deformity   Osteopenia    Prediabetes    Preglaucoma    Raynauds syndrome    Rectocele    Rosacea    Sciatica    Sleep apnea    Spinal stenosis    Vertebrobasilar artery syndrome     Past Surgical History:  Procedure Laterality Date   ABDOMINAL HYSTERECTOMY  1978   ANTERIOR (CYSTOCELE) AND POSTERIOR REPAIR (RECTOCELE) WITH XENFORM GRAFT AND SACROSPINOUS FIXATION     BILATERAL OOPHORECTOMY  1997   bladder stones     kidney colic/ procedure performed in 1973 at [facility]   BLADDER SUSPENSION     BREAST BIOPSY Left 12/1987   neg   BREAST BIOPSY Right 02/1977   neg papilloma    CARDIAC CATHETERIZATION  2000   HERNIA REPAIR     plantar fibroma  Left 09/1997   Excised     Current Outpatient Medications  Medication Sig Dispense Refill   Ascorbic Acid 500 MG CAPS Take by mouth.     Biotin 5000 MCG TABS Take 5,000 mcg by mouth daily.     hydrALAZINE (APRESOLINE) 10 MG tablet Take 1 tablet (10 mg total) by mouth as needed. Take 1 tab every 8 hours as needed 90 tablet 1   losartan (COZAAR) 50 MG tablet Take 1 tablet (50 mg total) by mouth daily. 30 tablet 11   Multiple Vitamin (MULTI-VITAMIN) tablet Take by mouth.     Omega-3 1000 MG CAPS Take by mouth.     vitamin B-12 (CYANOCOBALAMIN) 100 MCG tablet Take 100 mcg by mouth daily.     ALPRAZolam (XANAX) 0.5 MG tablet Take 1-2 tablets 30 minutes prior to MRI, may repeat once as needed. Must have driver. (Patient not taking: Reported on 05/20/2021) 3 tablet 0   calcium carbonate (TUMS - DOSED IN MG ELEMENTAL CALCIUM) 500 MG chewable tablet Chew 1 tablet by mouth as needed for indigestion or heartburn. (Patient not taking: Reported on 06/03/2021)     losartan (COZAAR) 25 MG tablet Take 1 tablet (25 mg total) by mouth daily. (Patient not taking: Reported on 06/03/2021) 90 tablet 3   mirabegron ER (MYRBETRIQ) 25 MG TB24 tablet Take 1 tablet (25 mg total) by mouth daily. (Patient not taking: Reported on 05/20/2021) 30 tablet 2   mupirocin ointment (BACTROBAN) 2 % Apply 1 application topically 2 (two) times daily. 22 g 0   No current facility-administered medications for this visit.    Allergies:   Calcitonin (salmon), Estradiol, Fexofenadine, Miacalcin [calcitonin], Nsaids, Diphenhydramine, Clarithromycin, Codeine, Doxepin hcl, Epinephrine, Erythromycin, Fluticasone, Fluticasone propionate, Ipratropium bromide, Lisinopril, Other, Sulfa antibiotics, Tetracycline, Amoxicillin-pot clavulanate, Ciprofloxacin, Latex, and Nitrofurantoin    ROS:  Please see the history of present illness.   Otherwise, review of systems are positive for  none .   All other systems are reviewed and negative.    PHYSICAL EXAM: VS:  BP 132/64    Pulse (!) 56    Ht 4\' 10"  (1.473 m)    Wt 134 lb (60.8 kg)    BMI 28.01 kg/m  , BMI Body mass index is 28.01 kg/m. GENERAL:  Well appearing NECK:  No jugular venous distention, waveform within normal  limits, carotid upstroke brisk and symmetric, no bruits, no thyromegaly LUNGS:  Clear to auscultation bilaterally CHEST:  Unremarkable HEART:  PMI not displaced or sustained,S1 and S2 within normal limits, no S3, no S4, no clicks, no rubs, no murmurs ABD:  Flat, positive bowel sounds normal in frequency in pitch, no bruits, no rebound, no guarding, no midline pulsatile mass, no hepatomegaly, no splenomegaly EXT:  2 plus pulses throughout, no edema, no cyanosis no clubbing   EKG:  EKG is not ordered today.   Recent Labs: 07/16/2020: ALT 15; BUN 14; Creatinine, Ser 0.77; Hemoglobin 13.5; Platelets 173; Potassium 4.7; Sodium 139    Lipid Panel    Component Value Date/Time   CHOL 230 (H) 07/16/2020 1227   TRIG 131 07/16/2020 1227   HDL 89 07/16/2020 1227   CHOLHDL 2.6 07/16/2020 1227   LDLCALC 119 (H) 07/16/2020 1227      Wt Readings from Last 3 Encounters:  06/03/21 134 lb (60.8 kg)  05/20/21 134 lb 12.8 oz (61.1 kg)  02/24/21 132 lb (59.9 kg)      Other studies Reviewed: Additional studies/ records that were reviewed today include: None  Review of the above records demonstrates: NA   ASSESSMENT AND PLAN:  PALPITATIONS:   She does not feel any palpitations.  No change in therapy.  HTN:     The blood pressure is somewhat labile.  She does check it very frequently as detailed notes.  She holds her medications at time if her blood is a little low.  We had long conversations about this.  It sounds like she does not really want to take the hydralazine as much and I suggested taking 25 mg of Cozaar in the evening in addition to the 50 that she is taking in the morning.  She is very sensitive  to medications and there are a lot of drug class that she cannot tolerate.  I do not think she is having any reaction to the ARB.  I think she will take the suggestions.   LBBB:    This has been chronic.  She had an extensive work-up.  No change in therapy.  Current medicines are reviewed at length with the patient today.  The patient does not have concerns regarding medicines.  The following changes have been made: As above  Labs/ tests ordered today include: None  No orders of the defined types were placed in this encounter.     Disposition:   FU with me or APP in in 6 months   Signed, Rollene Rotunda, MD  06/03/2021 1:19 PM    [facility]

## 2021-06-03 ENCOUNTER — Ambulatory Visit (INDEPENDENT_AMBULATORY_CARE_PROVIDER_SITE_OTHER): Payer: Medicare Other | Admitting: Cardiology

## 2021-06-03 ENCOUNTER — Ambulatory Visit: Payer: Medicare Other | Admitting: Cardiology

## 2021-06-03 ENCOUNTER — Encounter: Payer: Self-pay | Admitting: Cardiology

## 2021-06-03 ENCOUNTER — Other Ambulatory Visit: Payer: Self-pay

## 2021-06-03 VITALS — BP 132/64 | HR 56 | Ht <= 58 in | Wt 134.0 lb

## 2021-06-03 DIAGNOSIS — I10 Essential (primary) hypertension: Secondary | ICD-10-CM | POA: Diagnosis not present

## 2021-06-03 DIAGNOSIS — R002 Palpitations: Secondary | ICD-10-CM | POA: Diagnosis not present

## 2021-06-03 DIAGNOSIS — I447 Left bundle-branch block, unspecified: Secondary | ICD-10-CM | POA: Diagnosis not present

## 2021-06-03 NOTE — Patient Instructions (Signed)
Medication Instructions:  The current medical regimen is effective;  continue present plan and medications.  Please take Cozaar 25 mg every evening and 50 mg every morning.  *If you need a refill on your cardiac medications before your next appointment, please call your pharmacy*  Follow-Up: At [facility], you and your health needs are our priority.  As part of our continuing mission to provide you with exceptional heart care, we have created designated Provider Care Teams.  These Care Teams include your primary Cardiologist (physician) and Advanced Practice Providers (APPs -  Physician Assistants and Nurse Practitioners) who all work together to provide you with the care you need, when you need it.  We recommend signing up for the patient portal called "MyChart".  Sign up information is provided on this After Visit Summary.  MyChart is used to connect with patients for Virtual Visits (Telemedicine).  Patients are able to view lab/test results, encounter notes, upcoming appointments, etc.  Non-urgent messages can be sent to your provider as well.   To learn more about what you can do with MyChart, go to [email or web address].    Your next appointment:   6 month(s)  The format for your next appointment:   In Person  Provider:   Rollene Rotunda, MD{  Thank you for choosing [facility]!!

## 2021-06-05 ENCOUNTER — Telehealth: Payer: Self-pay | Admitting: Cardiology

## 2021-06-05 NOTE — Telephone Encounter (Signed)
Pt c/o BP issue: STAT if pt c/o blurred vision, one-sided weakness or slurred speech  1. What are your last 5 BP readings?  2/22: 90/40 2/24: 116/52  2. Are you having any other symptoms (ex. Dizziness, headache, blurred vision, passed out)?  Blurred vision (not currently)  3. What is your BP issue?   Patient is calling to provide updated BP readings. She states her BP has been dropping.

## 2021-06-05 NOTE — Telephone Encounter (Signed)
Tried to call pt p[hone just rings twice and hangs up

## 2021-06-10 ENCOUNTER — Other Ambulatory Visit: Payer: Self-pay | Admitting: Family Medicine

## 2021-06-10 ENCOUNTER — Ambulatory Visit: Payer: Medicare Other | Admitting: Cardiology

## 2021-06-10 DIAGNOSIS — Z1231 Encounter for screening mammogram for malignant neoplasm of breast: Secondary | ICD-10-CM

## 2021-06-19 NOTE — Telephone Encounter (Signed)
Minus Breeding, MD  Cv Div Nl Triage 22 minutes ago (12:47 PM)  ? ?I don't have any new suggestions for her.  I think the way she is taking it is reasonable.     ? ?Left message to call back  ?

## 2021-06-19 NOTE — Telephone Encounter (Signed)
Spoke with pt and has been taking Losartan 50 mg in the am and around noon checks B/P if systolic is greater than 150 than will take Losartan 25 mg 12 hours later after taking the 50 mg Per pt on 1 occasion noted systolic of 117 and  had some dizziness Will forward to Dr Antoine Poche for review ./cy ?

## 2021-06-24 NOTE — Telephone Encounter (Signed)
Spoke with pt, aware of dr hochrein's recommendations. ?

## 2021-06-30 ENCOUNTER — Encounter: Payer: Self-pay | Admitting: Family Medicine

## 2021-06-30 ENCOUNTER — Ambulatory Visit (INDEPENDENT_AMBULATORY_CARE_PROVIDER_SITE_OTHER): Payer: Medicare Other | Admitting: Family Medicine

## 2021-06-30 VITALS — BP 127/65 | HR 71 | Temp 97.5°F | Ht <= 58 in | Wt 132.6 lb

## 2021-06-30 DIAGNOSIS — R7303 Prediabetes: Secondary | ICD-10-CM

## 2021-06-30 DIAGNOSIS — L309 Dermatitis, unspecified: Secondary | ICD-10-CM

## 2021-06-30 DIAGNOSIS — I10 Essential (primary) hypertension: Secondary | ICD-10-CM | POA: Diagnosis not present

## 2021-06-30 LAB — BAYER DCA HB A1C WAIVED: HB A1C (BAYER DCA - WAIVED): 5.3 % (ref 4.8–5.6)

## 2021-06-30 MED ORDER — TRIAMCINOLONE ACETONIDE 0.1 % EX CREA
1.0000 | TOPICAL_CREAM | Freq: Two times a day (BID) | CUTANEOUS | 0 refills | Status: DC
Start: 2021-06-30 — End: 2021-08-04

## 2021-06-30 NOTE — Progress Notes (Signed)
? ?Assessment & Plan:  ?1. Dermatitis ?Reassurance provided this is not related to her Losartan. Rash is resolving. Triamcinolone cream prescribed for remaining areas. ?- triamcinolone cream (KENALOG) 0.1 %; Apply 1 application. topically 2 (two) times daily.  Dispense: 80 g; Refill: 0 ? ?2. Essential hypertension ?Well controlled on current regimen.  ?- CBC with Differential/Platelet ?- CMP14+EGFR ?- Lipid panel ? ?3. Pre-diabetes ?- Bayer DCA Hb A1c Waived ? ? ?Follow up plan: Return in about 6 weeks (around 08/11/2021) for follow-up of chronic medication conditions. ? ?Hendricks Limes, MSN, APRN, FNP-C ?[place] ? ?Subjective:  ? ?Patient ID: Heidi Dorsey, female    DOB: 01-22-1931, 88 y.o.   MRN: [id number] ? ?HPI: ?Heidi Dorsey is a 88 y.o. female presenting on 06/30/2021 for Rash (Patient states she has had a rash that has spread all over her body x 1 week.  Thinks its from losartan.) ? ?Patient reports a rash all over her body for the past week. States the areas start as a hive, which she scratches, and it turns into a scab. It is improving. She questions if it is related to her Losartan, but she has been on this medication for over a year.  ? ?Patient is also requesting routine lab work while she is here. ? ? ?ROS: Negative unless specifically indicated above in HPI.  ? ?Relevant past medical history reviewed and updated as indicated.  ? ?Allergies and medications reviewed and updated. ? ? ?Current Outpatient Medications:  ?  Ascorbic Acid 500 MG CAPS, Take by mouth., Disp: , Rfl:  ?  Biotin 5000 MCG TABS, Take 5,000 mcg by mouth daily., Disp: , Rfl:  ?  calcium carbonate (TUMS - DOSED IN MG ELEMENTAL CALCIUM) 500 MG chewable tablet, Chew 1 tablet by mouth as needed for indigestion or heartburn., Disp: , Rfl:  ?  losartan (COZAAR) 25 MG tablet, Take 1 tablet (25 mg total) by mouth daily., Disp: 90 tablet, Rfl: 3 ?  losartan (COZAAR) 50 MG tablet, Take 1 tablet (50 mg total) by mouth  daily., Disp: 30 tablet, Rfl: 11 ?  Multiple Vitamin (MULTI-VITAMIN) tablet, Take by mouth., Disp: , Rfl:  ?  mupirocin ointment (BACTROBAN) 2 %, Apply 1 application topically 2 (two) times daily., Disp: 22 g, Rfl: 0 ?  Omega-3 1000 MG CAPS, Take by mouth., Disp: , Rfl:  ?  vitamin B-12 (CYANOCOBALAMIN) 100 MCG tablet, Take 100 mcg by mouth daily., Disp: , Rfl:  ?  ALPRAZolam (XANAX) 0.5 MG tablet, Take 1-2 tablets 30 minutes prior to MRI, may repeat once as needed. Must have driver. (Patient not taking: Reported on 05/20/2021), Disp: 3 tablet, Rfl: 0 ?  hydrALAZINE (APRESOLINE) 10 MG tablet, Take 1 tablet (10 mg total) by mouth as needed. Take 1 tab every 8 hours as needed (Patient not taking: Reported on 06/30/2021), Disp: 90 tablet, Rfl: 1 ?  mirabegron ER (MYRBETRIQ) 25 MG TB24 tablet, Take 1 tablet (25 mg total) by mouth daily. (Patient not taking: Reported on 05/20/2021), Disp: 30 tablet, Rfl: 2 ? ?Allergies  ?Allergen Reactions  ? Calcitonin (Salmon) Other (See Comments)  ?  LEFT BUNDLE BRANCH BLOCK  ? Estradiol Other (See Comments)  ?  Headache  ? Fexofenadine Other (See Comments)  ?  Syncope  ? Miacalcin [Calcitonin] Palpitations  ? Nsaids Other (See Comments)  ?  Stomach ulcer after taking for 3 days in 2009. Negative H. Pylori test.  ? Diphenhydramine Other (See Comments)  ?  Syncope  ?  Clarithromycin Other (See Comments)  ?  CAN TAKE AZITHROMYCIN  ? Codeine Other (See Comments)  ? Doxepin Hcl Other (See Comments)  ?  Heart arrhythmia  ? Epinephrine Other (See Comments)  ? Erythromycin Other (See Comments)  ?  Stomach irritation  ? Fluticasone Other (See Comments)  ? Fluticasone Propionate Other (See Comments)  ?  And similar corticoid steroids  ? Ipratropium Bromide Other (See Comments)  ?  Syncope  ? Lisinopril Other (See Comments)  ?  Other Reaction: BRUISING, CANKERS & ARTHRALGIAS  ? Other Other (See Comments)  ?  Paraphenylendiamine/propanol amine/anti-cholinergic compounds  ? Sulfa Antibiotics Other (See  Comments)  ? Tetracycline Other (See Comments)  ?  Mouth sores after just 2 doses. DOXYCYCLINE is OK per patient  ? Amoxicillin-Pot Clavulanate Rash  ?  Able to take plain amoxicillin  ? Ciprofloxacin Rash  ? Latex Rash  ? Nitrofurantoin Other (See Comments)  ?  Multiple mild symptoms - would prefer not to take again  ? ? ?Objective:  ? ?BP 127/65   Pulse 71   Temp (!) 97.5 ?F (36.4 ?C) (Temporal)   Ht _0  (1.473 m)   Wt 132 lb 9.6 oz (60.1 kg)   BMI 27.71 kg/m?   ? ?Physical Exam ?Vitals reviewed.  ?Constitutional:   ?   General: She is not in acute distress. ?   Appearance: Normal appearance. She is not ill-appearing, toxic-appearing or diaphoretic.  ?HENT:  ?   Head: Normocephalic and atraumatic.  ?Eyes:  ?   General: No scleral icterus.    ?   Right eye: No discharge.     ?   Left eye: No discharge.  ?   Conjunctiva/sclera: Conjunctivae normal.  ?Cardiovascular:  ?   Rate and Rhythm: Normal rate.  ?Pulmonary:  ?   Effort: Pulmonary effort is normal. No respiratory distress.  ?Musculoskeletal:     ?   General: Normal range of motion.  ?   Cervical back: Normal range of motion.  ?Skin: ?   General: Skin is warm and dry.  ?   Capillary Refill: Capillary refill takes less than 2 seconds.  ?   Findings: Rash present. Rash is urticarial (neck, upper back, and lower extremities).  ?Neurological:  ?   General: No focal deficit present.  ?   Mental Status: She is alert and oriented to person, place, and time. Mental status is at baseline.  ?Psychiatric:     ?   Mood and Affect: Mood normal.     ?   Behavior: Behavior normal.     ?   Thought Content: Thought content normal.     ?   Judgment: Judgment normal.  ? ? ? ? ? ? ?

## 2021-07-01 LAB — CMP14+EGFR
ALT: 16 IU/L (ref 0–32)
AST: 19 IU/L (ref 0–40)
Albumin/Globulin Ratio: 2.4 — ABNORMAL HIGH (ref 1.2–2.2)
Albumin: 4.5 g/dL (ref 3.6–4.6)
Alkaline Phosphatase: 76 IU/L (ref 44–121)
BUN/Creatinine Ratio: 27 (ref 12–28)
BUN: 21 mg/dL (ref 8–27)
Bilirubin Total: 0.3 mg/dL (ref 0.0–1.2)
CO2: 24 mmol/L (ref 20–29)
Calcium: 9.5 mg/dL (ref 8.7–10.3)
Chloride: 95 mmol/L — ABNORMAL LOW (ref 96–106)
Creatinine, Ser: 0.79 mg/dL (ref 0.57–1.00)
Globulin, Total: 1.9 g/dL (ref 1.5–4.5)
Glucose: 89 mg/dL (ref 70–99)
Potassium: 4.7 mmol/L (ref 3.5–5.2)
Sodium: 139 mmol/L (ref 134–144)
Total Protein: 6.4 g/dL (ref 6.0–8.5)
eGFR: 72 mL/min/{1.73_m2} (ref >59)

## 2021-07-01 LAB — CBC WITH DIFFERENTIAL/PLATELET
Basophils Absolute: 0.1 10*3/uL (ref 0.0–0.2)
Basos: 1 %
EOS (ABSOLUTE): 0.2 10*3/uL (ref 0.0–0.4)
Eos: 3 %
Hematocrit: 39.5 % (ref 34.0–46.6)
Hemoglobin: 13.1 g/dL (ref 11.1–15.9)
Immature Grans (Abs): 0 10*3/uL (ref 0.0–0.1)
Immature Granulocytes: 1 %
Lymphocytes Absolute: 1.3 10*3/uL (ref 0.7–3.1)
Lymphs: 22 %
MCH: 29.4 pg (ref 26.6–33.0)
MCHC: 33.2 g/dL (ref 31.5–35.7)
MCV: 89 fL (ref 79–97)
Monocytes Absolute: 0.7 10*3/uL (ref 0.1–0.9)
Monocytes: 12 %
Neutrophils Absolute: 3.5 10*3/uL (ref 1.4–7.0)
Neutrophils: 61 %
Platelets: 183 10*3/uL (ref 150–450)
RBC: 4.46 x10E6/uL (ref 3.77–5.28)
RDW: 13.4 % (ref 11.7–15.4)
WBC: 5.8 10*3/uL (ref 3.4–10.8)

## 2021-07-01 LAB — LIPID PANEL
Chol/HDL Ratio: 2.2 ratio (ref 0.0–4.4)
Cholesterol, Total: 200 mg/dL — ABNORMAL HIGH (ref 100–199)
HDL: 91 mg/dL (ref >39)
LDL Chol Calc (NIH): 100 mg/dL — ABNORMAL HIGH (ref 0–99)
Triglycerides: 47 mg/dL (ref 0–149)
VLDL Cholesterol Cal: 9 mg/dL (ref 5–40)

## 2021-07-22 ENCOUNTER — Telehealth: Payer: Self-pay | Admitting: Cardiology

## 2021-07-22 NOTE — Telephone Encounter (Signed)
Pt c/o BP issue: STAT if pt c/o blurred vision, one-sided weakness or slurred speech ? ?1. What are your last 5 BP readings?  ?154/67 48 ?134/58  ?148/66 ? ?2. Are you having any other symptoms (ex. Dizziness, headache, blurred vision, passed out)?  ?No  ? ?3. What is your BP issue?  ? ?BP is elevated, but patient is asymptomatic. She states she took an extra dose of Losartan. ? ?

## 2021-07-22 NOTE — Telephone Encounter (Signed)
-  Pt called to report her BP was elevated today ?-Pt state she takes losartan 50 mg in the morning and 25 mg in the afternoon only if systolic is greater than [id number]. ?-Pt state she did take 25 mg today around 2 pm.  ?-Nurse encouraged pt to retake BP but pt state she's been up moving around and doing things.  ? ? ?4/12-154/67 48 ?4/5-134/58 48 ?3/27-136/59 ?3/24-131/66 72 ? ?-Nurse encourage pt to keep a log of BP for the next several weeks .  ?-Per Pharm D, agree with plan ?-Pt verbalized understanding.  ?

## 2021-07-30 ENCOUNTER — Telehealth: Payer: Self-pay | Admitting: Physician Assistant

## 2021-07-30 ENCOUNTER — Telehealth: Payer: Self-pay | Admitting: Cardiology

## 2021-07-30 MED ORDER — LOSARTAN POTASSIUM 50 MG PO TABS: 50.0000 mg | ORAL_TABLET | Freq: Two times a day (BID) | ORAL | 2 refills | Status: DC

## 2021-07-30 NOTE — Telephone Encounter (Signed)
Called to speak with pt regarding her recent blood pressures.  LM to CB to discuss. ? ?In review of Dr Hochrein's last office visit note pt had been taking  ?Cozaar 12.5 mg but this was d/ced d/t hypotension, ?No ARBs - per pt had eye bleed when taking and does not want to take it again, ?Intolerant of amlodipine, ?Intolerant of ACEs, ?Doesn't want to take Hydralazine because she read it will make her asthma worse,  ?Does not want to be seen in HTN Clinic and only wants to be seen in [place] office. ? ?Need to verify how pt is currently taking her medications. ? Losartan was last ordered to take 50 mg in AM and 25 mg in PM however there is documentation from 4/12 stating pt is only taking PM dose if systolic is greater than 145 bpm. ?

## 2021-07-30 NOTE — Telephone Encounter (Signed)
Pt c/o BP issue: STAT if pt c/o blurred vision, one-sided weakness or slurred speech ? ?1. What are your last 5 BP readings?  ?Today  163/67 HR 61 1:42pm ?4/19  168/68 HR 67 743am ? 150/74 HR 67 506pm ?4/14 147/69 ------ 10am ?4/12 154/67 HR 48 204pm ? ? ?2. Are you having any other symptoms (ex. Dizziness, headache, blurred vision, passed out)? Finds herself feeling shaky.  ? ?3. What is your BP issue? Her BP is high, feels losartan it's working for her.  ?

## 2021-07-30 NOTE — Telephone Encounter (Signed)
?  Pt very concerned about her BP, SBP 160-170 this pm. ?Has taken 50 mg am and 25 mg this after noon. ?She is asymptomatic, but concerned ? ?Lowest BP recently 124/51, HR 50 ? ?Intolerant of amlodipine, does not remember why ?Says metoprolol did not lower BP, previously stopped by Dr Darrold Junker ?Intolerant of ACEs ?Doesn't want to take Hydralazine because she read it will make her asthma worse ? ?Has a total of 22 intolerances/allergies ? ?After discussion, plan is to increase losartan to 50 mg bid, take at breakfast and supper. ? ?Continue to follow BP and let us know how this is tolerated.  ? ?Theodore Demark, PA-C ?07/30/2021 ?5:49 PM ? ?

## 2021-07-31 NOTE — Telephone Encounter (Signed)
Called to follow up with pt who reports she is now taking Losartan 50 mg BID,  she is very stressed and busy and doesn't have the time to discuss this at this time.  Call was disconnected. ?

## 2021-08-04 ENCOUNTER — Encounter: Payer: Self-pay | Admitting: Family Medicine

## 2021-08-04 ENCOUNTER — Telehealth: Payer: Self-pay

## 2021-08-04 ENCOUNTER — Telehealth: Payer: Self-pay | Admitting: Cardiology

## 2021-08-04 ENCOUNTER — Ambulatory Visit (INDEPENDENT_AMBULATORY_CARE_PROVIDER_SITE_OTHER): Payer: Medicare Other | Admitting: Family Medicine

## 2021-08-04 VITALS — BP 152/60 | HR 65 | Temp 97.2°F | Ht <= 58 in | Wt 132.0 lb

## 2021-08-04 DIAGNOSIS — L309 Dermatitis, unspecified: Secondary | ICD-10-CM | POA: Diagnosis not present

## 2021-08-04 DIAGNOSIS — I10 Essential (primary) hypertension: Secondary | ICD-10-CM | POA: Diagnosis not present

## 2021-08-04 MED ORDER — BETAMETHASONE DIPROPIONATE AUG 0.05 % EX CREA: TOPICAL_CREAM | Freq: Two times a day (BID) | CUTANEOUS | 0 refills | Status: DC

## 2021-08-04 NOTE — Progress Notes (Signed)
? ?Assessment & Plan:  ?1. Essential hypertension ?Advised patient to take medication for today as she has not and her blood pressure is high. Recommended bringing her blood pressure cuffs to the office to have them compared to ours for accuracy. Continue keeping a log of blood pressures and follow-up with cardiology. ? ?2. Dermatitis ?Improving. Stronger steroid cream prescribed. D/C triamcinolone.  ?- augmented betamethasone dipropionate (DIPROLENE-AF) 0.05 % cream; Apply topically 2 (two) times daily.  Dispense: 30 g; Refill: 0 ? ? ?Follow up plan: Return if symptoms worsen or fail to improve. ? ?Deliah Boston, MSN, APRN, FNP-C ?[facility] ? ?Subjective:  ? ?Patient ID: Heidi Dorsey, female    DOB: 12-05-1930, 88 y.o.   MRN: [id number] ? ?HPI: ?Heidi Dorsey is a 88 y.o. female presenting on 08/04/2021 for low BP (Patient states it has been running low since 07/23/21. ) and Dizziness (Started when her BP started running low. ) ? ?Patient is here today with concerns regarding her blood pressure being too low. She feels her vision is "out of focus" due to her blood pressure. She does become symptomatic (stumbling, light headed) when her blood pressure is low. Upon chart review it appears she has been in contact with cardiology several times recently and they have been adjusting her regimen. She was advised to hold her medication if her systolic is <110 which she has been doing. She has multiple cuffs at home and has not had them assessed for accuracy.  ? ?While present she also reports the itchy spots she has remaining from last month have not gone away. She has been applying triamcinolone cream. They have improved, but not resolved completely.  ? ? ?ROS: Negative unless specifically indicated above in HPI.  ? ?Relevant past medical history reviewed and updated as indicated.  ? ?Allergies and medications reviewed and updated. ? ? ?Current Outpatient Medications:  ?  Ascorbic Acid 500 MG CAPS,  Take by mouth., Disp: , Rfl:  ?  Biotin 5000 MCG TABS, Take 5,000 mcg by mouth daily., Disp: , Rfl:  ?  calcium carbonate (TUMS - DOSED IN MG ELEMENTAL CALCIUM) 500 MG chewable tablet, Chew 1 tablet by mouth as needed for indigestion or heartburn., Disp: , Rfl:  ?  losartan (COZAAR) 50 MG tablet, Take 1 tablet (50 mg total) by mouth 2 (two) times daily. Please d/c losartan 25 mg tabs, Disp: 60 tablet, Rfl: 2 ?  Multiple Vitamin (MULTI-VITAMIN) tablet, Take by mouth., Disp: , Rfl:  ?  mupirocin ointment (BACTROBAN) 2 %, Apply 1 application topically 2 (two) times daily., Disp: 22 g, Rfl: 0 ?  Omega-3 1000 MG CAPS, Take by mouth., Disp: , Rfl:  ?  triamcinolone cream (KENALOG) 0.1 %, Apply 1 application. topically 2 (two) times daily., Disp: 80 g, Rfl: 0 ?  vitamin B-12 (CYANOCOBALAMIN) 100 MCG tablet, Take 100 mcg by mouth daily., Disp: , Rfl:  ? ?Allergies  ?Allergen Reactions  ? Calcitonin (Salmon) Other (See Comments)  ?  LEFT BUNDLE BRANCH BLOCK  ? Estradiol Other (See Comments)  ?  Headache  ? Fexofenadine Other (See Comments)  ?  Syncope  ? Miacalcin [Calcitonin] Palpitations  ? Nsaids Other (See Comments)  ?  Stomach ulcer after taking for 3 days in 2009. Negative H. Pylori test.  ? Diphenhydramine Other (See Comments)  ?  Syncope  ? Clarithromycin Other (See Comments)  ?  CAN TAKE AZITHROMYCIN  ? Codeine Other (See Comments)  ? Doxepin Hcl Other (See Comments)  ?  Heart arrhythmia  ? Epinephrine Other (See Comments)  ? Erythromycin Other (See Comments)  ?  Stomach irritation  ? Fluticasone Other (See Comments)  ? Fluticasone Propionate Other (See Comments)  ?  And similar corticoid steroids  ? Ipratropium Bromide Other (See Comments)  ?  Syncope  ? Lisinopril Other (See Comments)  ?  Other Reaction: BRUISING, CANKERS & ARTHRALGIAS  ? Other Other (See Comments)  ?  Paraphenylendiamine/propanol amine/anti-cholinergic compounds  ? Sulfa Antibiotics Other (See Comments)  ? Tetracycline Other (See Comments)  ?   Mouth sores after just 2 doses. DOXYCYCLINE is OK per patient  ? Amoxicillin-Pot Clavulanate Rash  ?  Able to take plain amoxicillin  ? Ciprofloxacin Rash  ? Latex Rash  ? Nitrofurantoin Other (See Comments)  ?  Multiple mild symptoms - would prefer not to take again  ? ? ?Objective:  ? ?BP (!) 152/60   Pulse 65   Temp (!) 97.2 ?F (36.2 ?C) (Temporal)   Ht 4\' 10"  (1.473 m)   Wt 132 lb (59.9 kg)   SpO2 99%   BMI 27.59 kg/m?   ? ?Physical Exam ?Vitals reviewed.  ?Constitutional:   ?   General: She is not in acute distress. ?   Appearance: Normal appearance. She is not ill-appearing, toxic-appearing or diaphoretic.  ?HENT:  ?   Head: Normocephalic and atraumatic.  ?Eyes:  ?   General: No scleral icterus.    ?   Right eye: No discharge.     ?   Left eye: No discharge.  ?   Conjunctiva/sclera: Conjunctivae normal.  ?Cardiovascular:  ?   Rate and Rhythm: Normal rate and regular rhythm.  ?   Heart sounds: Normal heart sounds. No murmur heard. ?  No friction rub. No gallop.  ?Pulmonary:  ?   Effort: Pulmonary effort is normal. No respiratory distress.  ?   Breath sounds: Normal breath sounds. No stridor. No wheezing, rhonchi or rales.  ?Musculoskeletal:     ?   General: Normal range of motion.  ?   Cervical back: Normal range of motion.  ?Skin: ?   General: Skin is warm and dry.  ?   Capillary Refill: Capillary refill takes less than 2 seconds.  ?   Findings: Rash (scaly, dry patches on BLE) present.  ?Neurological:  ?   General: No focal deficit present.  ?   Mental Status: She is alert and oriented to person, place, and time. Mental status is at baseline.  ?Psychiatric:     ?   Mood and Affect: Mood normal.     ?   Behavior: Behavior normal.     ?   Thought Content: Thought content normal.     ?   Judgment: Judgment normal.  ? ? ? ? ? ? ?

## 2021-08-04 NOTE — Telephone Encounter (Signed)
Pt called today c/o low BP readings. She has got a reading as low as 93 systolic. She does become light headed and may stumble when BP becomes low. Pt has been eating and drinking well. ? ?Pt states that she was originally on 50mg  of Losartan BID. When she noticed BP was running too low and feeling light headed she tried cutting the evening dose in 1/2. Per pt she still had low readings in doing that dose. Advised pt that she could just try taking the 50mg  daily and keep a check on BP and call back with readings. Pt believes that it will still be too low on that strength. Then I suggested she could trying taking just 25mg  daily. Pt did not want to try that or skipping the medication all together. Offered to have her hold medication all together, keep eye on BP and schedule an appt with triage at the end of the week. Pt declines and wants to be seen in the office by a " medical professional". ? ?Appt made with Britney this am at 9:05. Pt states that she just lives minutes away and can be here at 9:00. ?

## 2021-08-04 NOTE — Telephone Encounter (Signed)
Patient called in reporting she has been getting low blood pressure readings over the past couple of days. Most recent this morning was 93/86, prior to that was 129/67. Med regimen includes losartan 50mg  BID, which she has been holding with lower Bps. Advised if her systolic <110 then would hold her medication. Would also reduce the losartan to 25mg  and take in the morning as most of her lower readings have been at night. She is agreeable, but requested an office visit as well. Will route to the office to reach for scheduling appt.  ?

## 2021-08-05 ENCOUNTER — Ambulatory Visit: Payer: Medicare Other | Admitting: Family Medicine

## 2021-08-10 ENCOUNTER — Telehealth: Payer: Self-pay | Admitting: Cardiology

## 2021-08-10 ENCOUNTER — Encounter: Payer: Self-pay | Admitting: Family Medicine

## 2021-08-10 NOTE — Telephone Encounter (Signed)
Pt c/o BP issue: STAT if pt c/o blurred vision, one-sided weakness or slurred speech ? ?1. What are your last 5 BP readings? This morning 7 am 123/81 HR 68, took losartan, yesterday morning 8am 122/60, Saturday 131/94 was shaky HR 71, Friday face was flushed no idea why 148/65 HR 51, Thursday 7 pm 156/75 HR 69 took 25 mg losartan Thursday morning 127/63 HR 49 still took 50 mg of losartan  ? ?2. Are you having any other symptoms (ex. Dizziness, headache, blurred vision, passed out)? Lightheaded, blurred vision  ? ?3. What is your BP issue? Patient states her BP has been up and down. She says she has blurred vision and lightheadedness. She says she has no headaches now but also gets "muddled thinking". 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 She says buttoning a button is challenging and she drops everything.  ?
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
## 2021-08-10 NOTE — Telephone Encounter (Signed)
Spoke to patient advised message was sent to Dr.Hochrein.He is out of office this afternoon.Advised we will call back with his advice after he reviews message. ?

## 2021-08-10 NOTE — Telephone Encounter (Signed)
Calling back looking for update. Please advise  ?

## 2021-08-11 NOTE — Telephone Encounter (Signed)
Spoke with pt, she reports that yesterday her bp in the morning was 123/81 and she took losartan 50 mg and did fine during the day. She did not check her bp anymore during the day. This morning, her bp is 121/62 and she only took 25 mg of the losartan because she has symptoms if bp gets too low. Okay given for patient to take the 25 mg of the losartan in the morning if bp is running low. ?

## 2021-08-19 ENCOUNTER — Ambulatory Visit (INDEPENDENT_AMBULATORY_CARE_PROVIDER_SITE_OTHER): Payer: Medicare Other | Admitting: Family Medicine

## 2021-08-19 ENCOUNTER — Encounter: Payer: Self-pay | Admitting: Family Medicine

## 2021-08-19 VITALS — BP 155/60 | HR 71 | Temp 98.2°F | Ht <= 58 in | Wt 129.0 lb

## 2021-08-19 DIAGNOSIS — R350 Frequency of micturition: Secondary | ICD-10-CM | POA: Diagnosis not present

## 2021-08-19 DIAGNOSIS — R001 Bradycardia, unspecified: Secondary | ICD-10-CM | POA: Diagnosis not present

## 2021-08-19 DIAGNOSIS — I10 Essential (primary) hypertension: Secondary | ICD-10-CM

## 2021-08-19 LAB — URINALYSIS, ROUTINE W REFLEX MICROSCOPIC
Bilirubin, UA: NEGATIVE
Glucose, UA: NEGATIVE
Ketones, UA: NEGATIVE
Leukocytes,UA: NEGATIVE
Nitrite, UA: NEGATIVE
Protein,UA: NEGATIVE
RBC, UA: NEGATIVE
Specific Gravity, UA: 1.02 (ref 1.005–1.030)
Urobilinogen, Ur: 0.2 mg/dL (ref 0.2–1.0)
pH, UA: 6 (ref 5.0–7.5)

## 2021-08-19 NOTE — Progress Notes (Signed)
?  ? ?Subjective:  ?Patient ID: Heidi Dorsey, female    DOB: 06/28/1930, 88 y.o.   MRN: [id number] ? ?Patient Care Team: ?Gwenlyn Fudge, FNP as PCP - General (Family Medicine) ?Glendale Chard, DO as Consulting Physician (Neurology)  ? ?Chief Complaint:  Hypertension (B/p check/Possible UTI) ? ? ?HPI: ?Heidi Dorsey is a 88 y.o. female presenting on 08/19/2021 for Hypertension (B/p check/Possible UTI) ? ? ?Patient presents today with reports of low blood pressure readings at home.  Patient was last seen by PCP and told to bring machine to next visit so readings could be compared.  Patient states her reading this morning was 100/50.  In office reading is 157/78 on office machine and 119/90 on home machine. She has not taken her blood pressure medications today. Her initial HR in office was noted to be in the 50s. Pt asymptomatic. HR normal once provider in room to assess pt. She is followed by cardiology on a regular basis who has been adjusting her blood pressure medications.  She has not spoke with cardiology today.  She denies headaches, chest pain, leg swelling, palpitations, orthopnea, PND, dizziness, or syncope. ?She does report having to get up several times at night to void, no other associated symptoms. ? ? ?Relevant past medical, surgical, family, and social history reviewed and updated as indicated.  ?Allergies and medications reviewed and updated. Data reviewed: Chart in Epic. ? ? ?Past Medical History:  ?Diagnosis Date  ? Asthma   ? BPPV (benign paroxysmal positional vertigo)   ? Claustrophobia   ? Diverticulosis   ? GERD (gastroesophageal reflux disease)   ? H/O degenerative disc disease   ? Hiatal hernia   ? Hyperlipidemia   ? Hypertension   ? Kidney stones   ? Migraines   ? Osteoarthritis   ? Caused feet deformity  ? Osteopenia   ? Prediabetes   ? Preglaucoma   ? Raynauds syndrome   ? Rectocele   ? Rosacea   ? Sciatica   ? Sleep apnea   ? Spinal stenosis   ? Vertebrobasilar artery syndrome    ? ? ?Past Surgical History:  ?Procedure Laterality Date  ? ABDOMINAL HYSTERECTOMY  1978  ? ANTERIOR (CYSTOCELE) AND POSTERIOR REPAIR (RECTOCELE) WITH XENFORM GRAFT AND SACROSPINOUS FIXATION    ? BILATERAL OOPHORECTOMY  1997  ? bladder stones    ? kidney colic/ procedure performed in [facility] at [facility]  ? BLADDER SUSPENSION    ? BREAST BIOPSY Left 12/1987  ? neg  ? BREAST BIOPSY Right 02/1977  ? neg papilloma  ? CARDIAC CATHETERIZATION  2000  ? HERNIA REPAIR    ? plantar fibroma  Left 09/1997  ? Excised  ? ? ?Social History  ? ?Socioeconomic History  ? Marital status: Single  ?  Spouse name: Not on file  ? Number of children: Not on file  ? Years of education: Not on file  ? Highest education level: Not on file  ?Occupational History  ? Occupation: Retired  ?  Comment: Scientist, product/process development  ?Tobacco Use  ? Smoking status: Never  ? Smokeless tobacco: Never  ?Vaping Use  ? Vaping Use: Never used  ?Substance and Sexual Activity  ? Alcohol use: Yes  ?  Alcohol/week: 2.0 standard drinks  ?  Types: 2 Glasses of wine per week  ?  Comment: per week  ? Drug use: Never  ? Sexual activity: Not Currently  ?  Partners: Male  ?  Birth control/protection: Surgical  ?  Other Topics Concern  ? Not on file  ?Social History Narrative  ? Right Handed  ? Lives in an apartment complex and her apartment is on ground level.   ? Drinks Half Decaf Coffee and Tea  ? Originally from [place], then lived in [place] most of her life - moved here 09/2019 - feels out of place, having trouble finding others with similar interests  ? She is agnostic - not religious (although raised Catholic) - strong scientific beliefs   ? ?Social Determinants of Health  ? ?Financial Resource Strain: Medium Risk  ? Difficulty of Paying Living Expenses: Somewhat hard  ?Food Insecurity: No Food Insecurity  ? Worried About Programme researcher, broadcasting/film/video in the Last Year: Never true  ? Ran Out of Food in the Last Year: Never true  ?Transportation Needs: No Transportation Needs  ? Lack  of Transportation (Medical): No  ? Lack of Transportation (Non-Medical): No  ?Physical Activity: Insufficiently Active  ? Days of Exercise per Week: 7 days  ? Minutes of Exercise per Session: 20 min  ?Stress: No Stress Concern Present  ? Feeling of Stress : Not at all  ?Social Connections: Socially Isolated  ? Frequency of Communication with Friends and Family: More than three times a week  ? Frequency of Social Gatherings with Friends and Family: Once a week  ? Attends Religious Services: Never  ? Active Member of Clubs or Organizations: No  ? Attends Banker Meetings: Never  ? Marital Status: Divorced  ?Intimate Partner Violence: Not At Risk  ? Fear of Current or Ex-Partner: No  ? Emotionally Abused: No  ? Physically Abused: No  ? Sexually Abused: No  ? ? ?Outpatient Encounter Medications as of 08/19/2021  ?Medication Sig  ? Ascorbic Acid 500 MG CAPS Take by mouth.  ? augmented betamethasone dipropionate (DIPROLENE-AF) 0.05 % cream Apply topically 2 (two) times daily.  ? Biotin 5000 MCG TABS Take 5,000 mcg by mouth daily.  ? calcium carbonate (TUMS - DOSED IN MG ELEMENTAL CALCIUM) 500 MG chewable tablet Chew 1 tablet by mouth as needed for indigestion or heartburn.  ? losartan (COZAAR) 50 MG tablet Take 1 tablet (50 mg total) by mouth 2 (two) times daily. Please d/c losartan 25 mg tabs  ? Multiple Vitamin (MULTI-VITAMIN) tablet Take by mouth.  ? mupirocin ointment (BACTROBAN) 2 % Apply 1 application topically 2 (two) times daily.  ? Omega-3 1000 MG CAPS Take by mouth.  ? vitamin B-12 (CYANOCOBALAMIN) 100 MCG tablet Take 100 mcg by mouth daily.  ? ?No facility-administered encounter medications on file as of 08/19/2021.  ? ? ?Allergies  ?Allergen Reactions  ? Calcitonin ([place]) Other (See Comments)  ?  LEFT BUNDLE BRANCH BLOCK  ? Estradiol Other (See Comments)  ?  Headache  ? Fexofenadine Other (See Comments)  ?  Syncope  ? Miacalcin [Calcitonin] Palpitations  ? Nsaids Other (See Comments)  ?  Stomach  ulcer after taking for 3 days in 2009. Negative H. Pylori test.  ? Diphenhydramine Other (See Comments)  ?  Syncope  ? Clarithromycin Other (See Comments)  ?  CAN TAKE AZITHROMYCIN  ? Codeine Other (See Comments)  ? Doxepin Hcl Other (See Comments)  ?  Heart arrhythmia  ? Epinephrine Other (See Comments)  ? Erythromycin Other (See Comments)  ?  Stomach irritation  ? Fluticasone Other (See Comments)  ? Fluticasone Propionate Other (See Comments)  ?  And similar corticoid steroids  ? Ipratropium Bromide Other (See Comments)  ?  Syncope  ? Lisinopril Other (See Comments)  ?  Other Reaction: BRUISING, CANKERS & ARTHRALGIAS  ? Other Other (See Comments)  ?  Paraphenylendiamine/propanol amine/anti-cholinergic compounds  ? Sulfa Antibiotics Other (See Comments)  ? Tetracycline Other (See Comments)  ?  Mouth sores after just 2 doses. DOXYCYCLINE is OK per patient  ? Amoxicillin-Pot Clavulanate Rash  ?  Able to take plain amoxicillin  ? Ciprofloxacin Rash  ? Latex Rash  ? Nitrofurantoin Other (See Comments)  ?  Multiple mild symptoms - would prefer not to take again  ? ? ?Review of Systems  ?Constitutional:  Negative for activity change, appetite change, chills, diaphoresis, fatigue, fever and unexpected weight change.  ?HENT: Negative.    ?Eyes: Negative.  Negative for photophobia and visual disturbance.  ?Respiratory:  Negative for cough, chest tightness and shortness of breath.   ?Cardiovascular:  Negative for chest pain, palpitations and leg swelling.  ?Gastrointestinal:  Negative for abdominal pain, blood in stool, constipation, diarrhea, nausea and vomiting.  ?Endocrine: Negative.   ?Genitourinary:  Positive for frequency. Negative for decreased urine volume, difficulty urinating, dysuria, enuresis, flank pain, hematuria, pelvic pain, urgency, vaginal bleeding, vaginal discharge and vaginal pain.  ?Musculoskeletal:  Negative for arthralgias and myalgias.  ?Skin: Negative.   ?Allergic/Immunologic: Negative.    ?Neurological:  Negative for dizziness, tremors, seizures, syncope, facial asymmetry, speech difficulty, weakness, light-headedness, numbness and headaches.  ?Hematological: Negative.   ?Psychiatric/Behavioral:  Negati

## 2021-08-20 ENCOUNTER — Other Ambulatory Visit: Payer: Self-pay | Admitting: Family Medicine

## 2021-08-20 DIAGNOSIS — Z1231 Encounter for screening mammogram for malignant neoplasm of breast: Secondary | ICD-10-CM

## 2021-09-11 ENCOUNTER — Ambulatory Visit: Payer: Medicare Other | Admitting: Family Medicine

## 2021-09-16 ENCOUNTER — Ambulatory Visit: Payer: Medicare Other

## 2021-10-06 NOTE — Progress Notes (Signed)
Cardiology Office Note   Date:  10/07/2021   ID:  Heidi, Dorsey 1930-07-10, MRN [id number]  PCP:  Gwenlyn Fudge, FNP  Cardiologist:   None Referring:  Gwenlyn Fudge, FNP  Chief Complaint  Patient presents with   Hypertension       History of Present Illness: Heidi Dorsey is a 88 y.o. female who presents for evaluation of palpitations.    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  She had a long history of left bundle branch block.  She has had a cardiac catheterization in 2000.  She does not remember the results but was not told that she had vascular disease.  I see that she has had carotid Dopplers which were unremarkable.  She had a very mildly reduced ejection fraction on the stress echo in 2017 with her EF being 50%.  She has had a 24-hour Holter.  She previously has been treated with metoprolol.  Most recently she was on Cozaar 12.5 mg daily but this was discontinued because her blood pressures were running low.   She also called back and she was had an eye bleed on ARB so she did not want to start this.  She has also been intolerant of amlodipine.  She cannot take ACE inhibitors.  She seemed to be tolerating beta blocker so at the last visit I was prescribing this.  I had suggested hydralazine PRN SBP greater than 160.  She called back because she did not want to take the hydralazine because she read it would worsen her asthma.    When we spoke to her in June she was taking Losartan in the AM and metoprolol in the evening.  She was back to taking hydralazine but because a pharmacist told her it did not make sense to take it PRN she was taking it daily.  She did not want to be seen in the HTN clinic.  I tried to schedule her to see me in [place] as there were no appts in [place] and she would not do this.  Her blood pressure has been labile.    She presents for follow up.  She had no new specific complaints since I saw her.  She thinks that  taking the 50 mg of Cozaar a day once daily is too much and would like to get twice daily.  She is not noticing any palpitations.  She has not had any new chest pressure, neck or arm discomfort.  She had no new shortness of breath, PND or orthopnea.   Past Medical History:  Diagnosis Date   Asthma    BPPV (benign paroxysmal positional vertigo)    Claustrophobia    Diverticulosis    GERD (gastroesophageal reflux disease)    H/O degenerative disc disease    Hiatal hernia    Hyperlipidemia    Hypertension    Kidney stones    Migraines    Osteoarthritis    Caused feet deformity   Osteopenia    Prediabetes    Preglaucoma    Raynauds syndrome    Rectocele    Rosacea    Sciatica    Sleep apnea    Spinal stenosis    Vertebrobasilar artery syndrome     Past Surgical History:  Procedure Laterality Date   ABDOMINAL HYSTERECTOMY  1978   ANTERIOR (CYSTOCELE) AND POSTERIOR REPAIR (RECTOCELE) WITH XENFORM GRAFT AND SACROSPINOUS FIXATION     BILATERAL OOPHORECTOMY  1997  bladder stones     kidney colic/ procedure performed in 1973 at [facility]   BLADDER SUSPENSION     BREAST BIOPSY Left 12/1987   neg   BREAST BIOPSY Right 02/1977   neg papilloma   CARDIAC CATHETERIZATION  2000   HERNIA REPAIR     plantar fibroma  Left 09/1997   Excised     Current Outpatient Medications  Medication Sig Dispense Refill   Ascorbic Acid 500 MG CAPS Take by mouth.     augmented betamethasone dipropionate (DIPROLENE-AF) 0.05 % cream Apply topically 2 (two) times daily. 30 g 0   Biotin 5000 MCG TABS Take 5,000 mcg by mouth daily.     calcium carbonate (TUMS - DOSED IN MG ELEMENTAL CALCIUM) 500 MG chewable tablet Chew 1 tablet by mouth as needed for indigestion or heartburn.     losartan (COZAAR) 25 MG tablet Take 1 tablet (25 mg total) by mouth daily. 180 tablet 3   Multiple Vitamin (MULTI-VITAMIN) tablet Take by mouth.     mupirocin ointment (BACTROBAN) 2 % Apply 1 application topically 2 (two) times  daily. 22 g 0   Omega-3 1000 MG CAPS Take by mouth.     vitamin B-12 (CYANOCOBALAMIN) 100 MCG tablet Take 100 mcg by mouth daily.     No current facility-administered medications for this visit.    Allergies:   Calcitonin (salmon), Estradiol, Fexofenadine, Miacalcin [calcitonin], Nsaids, Diphenhydramine, Amoxicillin-pot clavulanate, Ciprofloxacin, Clarithromycin, Codeine, Doxepin hcl, Epinephrine, Erythromycin, Fluticasone, Fluticasone propionate, Ipratropium bromide, Latex, Lisinopril, Nitrofurantoin, Other, Sulfa antibiotics, and Tetracycline    ROS:  Please see the history of present illness.   Otherwise, review of systems are positive for none .   All other systems are reviewed and negative.    PHYSICAL EXAM: VS:  BP (!) 152/62   Pulse 73   Ht 4\' 10"  (1.473 m)   Wt 129 lb (58.5 kg)   SpO2 96%   BMI 26.96 kg/m  , BMI Body mass index is 26.96 kg/m. GENERAL:  Well appearing NECK:  No jugular venous distention, waveform within normal limits, carotid upstroke brisk and symmetric, no bruits, no thyromegaly LUNGS:  Clear to auscultation bilaterally CHEST:  Unremarkable HEART:  PMI not displaced or sustained,S1 and S2 within normal limits, no S3, no S4, no clicks, no rubs, no murmurs ABD:  Flat, positive bowel sounds normal in frequency in pitch, no bruits, no rebound, no guarding, no midline pulsatile mass, no hepatomegaly, no splenomegaly EXT:  2 plus pulses throughout, no edema, no cyanosis no clubbing  EKG:  EKG is not ordered today. NA  Recent Labs: 06/30/2021: ALT 16; BUN 21; Creatinine, Ser 0.79; Hemoglobin 13.1; Platelets 183; Potassium 4.7; Sodium 139    Lipid Panel    Component Value Date/Time   CHOL 200 (H) 06/30/2021 1441   TRIG 47 06/30/2021 1441   HDL 91 06/30/2021 1441   CHOLHDL 2.2 06/30/2021 1441   LDLCALC 100 (H) 06/30/2021 1441      Wt Readings from Last 3 Encounters:  10/07/21 129 lb (58.5 kg)  08/19/21 129 lb (58.5 kg)  08/04/21 132 lb (59.9 kg)       Other studies Reviewed: Additional studies/ records that were reviewed today include: None  Review of the above records demonstrates: NA   ASSESSMENT AND PLAN:  PALPITATIONS: She does not feel her ectopy.  No change in therapy.  HTN:     The blood pressure is labile.   She thinks she will do better on 25  mg Cozaar twice daily and I think this is reasonable.  She is very sensitive to medications and there are several drug classes that she has not tolerated.  She is seems to be doing relatively well on the ARB.   Current medicines are reviewed at length with the patient today.  The patient does not have concerns regarding medicines.  The following changes have been made: As above  Labs/ tests ordered today include: None  No orders of the defined types were placed in this encounter.     Disposition:   FU with me  in six months   Signed, Rollene Rotunda, MD  10/07/2021 4:41 PM    [facility]

## 2021-10-07 ENCOUNTER — Encounter: Payer: Self-pay | Admitting: Cardiology

## 2021-10-07 ENCOUNTER — Ambulatory Visit (INDEPENDENT_AMBULATORY_CARE_PROVIDER_SITE_OTHER): Payer: Medicare Other | Admitting: Cardiology

## 2021-10-07 VITALS — BP 152/62 | HR 73 | Ht <= 58 in | Wt 129.0 lb

## 2021-10-07 DIAGNOSIS — I10 Essential (primary) hypertension: Secondary | ICD-10-CM | POA: Diagnosis not present

## 2021-10-07 DIAGNOSIS — R002 Palpitations: Secondary | ICD-10-CM

## 2021-10-07 MED ORDER — LOSARTAN POTASSIUM 25 MG PO TABS: 25.0000 mg | ORAL_TABLET | Freq: Every day | ORAL | 3 refills | Status: DC

## 2021-10-07 NOTE — Patient Instructions (Signed)
Medication Instructions:  Please decrease your Losartan to 25 mg twice a day. Continue all other medications as listed.  *If you need a refill on your cardiac medications before your next appointment, please call your pharmacy*  Follow-Up: At [facility], you and your health needs are our priority.  As part of our continuing mission to provide you with exceptional heart care, we have created designated Provider Care Teams.  These Care Teams include your primary Cardiologist (physician) and Advanced Practice Providers (APPs -  Physician Assistants and Nurse Practitioners) who all work together to provide you with the care you need, when you need it.  We recommend signing up for the patient portal called "MyChart".  Sign up information is provided on this After Visit Summary.  MyChart is used to connect with patients for Virtual Visits (Telemedicine).  Patients are able to view lab/test results, encounter notes, upcoming appointments, etc.  Non-urgent messages can be sent to your provider as well.   To learn more about what you can do with MyChart, go to [email or web address].    Your next appointment:   6 month(s)  The format for your next appointment:   In Person  Provider:   Rollene Rotunda, MD{   Important Information About Sugar

## 2021-10-16 ENCOUNTER — Telehealth: Payer: Self-pay | Admitting: Cardiology

## 2021-10-16 NOTE — Telephone Encounter (Signed)
Pt c/o BP issue: STAT if pt c/o blurred vision, one-sided weakness or slurred speech  1. What are your last 5 BP readings?  07/07 8 a.m. 173/72  HR 58 4 p.m. 163/74  HR 63 Took second dose of losartan at 4:18 p.m.    2. Are you having any other symptoms (ex. Dizziness, headache, blurred vision, passed out)? Vision has been terrible she states, but doesn't know if it's due to BP.   3. What is your BP issue? Pt states that they believe the medication isn't working for her bp and is wondering if it should be increased. Requesting call back.

## 2021-10-16 NOTE — Telephone Encounter (Signed)
I was notified that this patient had called that answering service stating that her BP was "out of control." Per chart review, appears that patient contacted our office earlier today with same complaints. Reported that her BP was 173/72 at 8 AM and 163/74 at 4 PM. Took a second dose of losartan at 4:18 PM.   Appears that patient was seen by Dr. Antoine Poche on 6/28 and  complained that her BP was labile. Patient was instructed to increase her Cozaar 25 mg to BID dosing. Patient is very sensitive to medications and there are several drug classes that she has not tolerated.   In talking with the patient, she reports that she has been taking her BP prior to taking her medications. She has not been taking her blood pressure after taking her medications. I encouraged her to start monitoring her BP between 30-60 minutes after taking her medications. Patient asked when she should come to the ED for evaluation. I told her that if her BP is greater than 180/110 after taking her BP medications, or if she develops chest pain, vision changes, syncope, or dizziness she should go to the ED.  Patient voiced her understanding.  Jonita Albee, PA-C 10/16/2021 5:47 PM

## 2021-10-16 NOTE — Telephone Encounter (Signed)
LMTCB

## 2021-10-26 ENCOUNTER — Telehealth: Payer: Self-pay | Admitting: Cardiology

## 2021-10-26 MED ORDER — LOSARTAN POTASSIUM 25 MG PO TABS: 25.0000 mg | ORAL_TABLET | Freq: Two times a day (BID) | ORAL | 3 refills | Status: DC

## 2021-10-26 NOTE — Telephone Encounter (Signed)
Called the patient back. She was upset that Losartan 50 mg bid was sent into the pharmacy. She stated that she is currently on Losartan 25 mg bid. She has been advised that this was an old prescription and was filled previously.  She has been advised that the latest prescription states Losartan 25 mg once daily.  According to the last office note, the patient should be on Losartan 25 mg bid. She has been taking this since her office visit on 6/28.  The correct prescription has been sent in for her.   HTN: The blood pressure is labile.  She thinks she will do better on 25 mg Cozaar twice daily and I think this is reasonable.  She is very sensitive to medications and there are several drug classes that she has not tolerated.  She is seems to be doing relatively well on the ARB.

## 2021-10-26 NOTE — Telephone Encounter (Signed)
Pt c/o medication issue:  1. Name of Medication:   losartan (COZAAR) 25 MG tablet  2. How are you currently taking this medication (dosage and times per day)?  Twice daily  3. Are you having a reaction (difficulty breathing--STAT)?  Unknown  4. What is your medication issue?    Caller reported that their pharmacist, Bjorn Loser, spoke with the patient who stated she has been taking this medication twice daily.  Prescription shows she should be taking this medication once daily.  Caller requested the patient be contacted directly to confirm the dosage she should be taking.

## 2021-10-26 NOTE — Telephone Encounter (Signed)
See other note

## 2021-11-24 ENCOUNTER — Encounter: Payer: Self-pay | Admitting: Family Medicine

## 2021-11-24 ENCOUNTER — Ambulatory Visit (INDEPENDENT_AMBULATORY_CARE_PROVIDER_SITE_OTHER): Payer: Medicare Other | Admitting: Family Medicine

## 2021-11-24 ENCOUNTER — Telehealth: Payer: Self-pay | Admitting: Family Medicine

## 2021-11-24 VITALS — BP 156/68 | HR 69 | Temp 97.2°F | Ht <= 58 in | Wt 126.8 lb

## 2021-11-24 DIAGNOSIS — R351 Nocturia: Secondary | ICD-10-CM

## 2021-11-24 DIAGNOSIS — L309 Dermatitis, unspecified: Secondary | ICD-10-CM

## 2021-11-24 DIAGNOSIS — N3281 Overactive bladder: Secondary | ICD-10-CM | POA: Diagnosis not present

## 2021-11-24 LAB — URINALYSIS, ROUTINE W REFLEX MICROSCOPIC
Bilirubin, UA: NEGATIVE
Glucose, UA: NEGATIVE
Ketones, UA: NEGATIVE
Leukocytes,UA: NEGATIVE
Nitrite, UA: NEGATIVE
Protein,UA: NEGATIVE
RBC, UA: NEGATIVE
Specific Gravity, UA: 1.015 (ref 1.005–1.030)
Urobilinogen, Ur: 0.2 mg/dL (ref 0.2–1.0)
pH, UA: 6 (ref 5.0–7.5)

## 2021-11-24 MED ORDER — MIRABEGRON ER 25 MG PO TB24: 25.0000 mg | ORAL_TABLET | Freq: Every evening | ORAL | 1 refills | Status: DC

## 2021-11-24 NOTE — Telephone Encounter (Signed)
Patient aware that she will get shingrix shot at next appointment.

## 2021-11-24 NOTE — Progress Notes (Signed)
Assessment & Plan:  1. Overactive bladder Education provided on overactive bladder.  Samples of Myrbetriq 25 mg each evening given.  Encouraged patient to try the medication. - mirabegron ER (MYRBETRIQ) 25 MG TB24 tablet; Take 1 tablet (25 mg total) by mouth every evening.  Dispense: 30 tablet; Refill: 1  2. Nocturia - Urinalysis, Routine w reflex microscopic - NEGATIVE  3. Dermatitis Encouraged to use betamethasone cream as previously prescribed.   Follow up plan: Return in about 6 weeks (around 01/05/2022) for follow-up of chronic medication conditions with [facility].  Deliah Boston, MSN, APRN, FNP-C [facility]  Subjective:   Patient ID: Heidi Dorsey, female    DOB: 17-Nov-1930, 88 y.o.   MRN: [id number]  HPI: Heidi Dorsey is a 88 y.o. female presenting on 11/24/2021 for Urinary Frequency (X 1 month during the night )  Patient complains of urinary frequency during the night.  States when she lays down it feels like something is pressing on the nerves of her bladder and telling her that she needs to go urinate every hour, but when she does that she only urinates small amounts.  Denies dysuria.  She does have a diagnosis of overactive bladder a diagnosis of overactive bladder, and does not take medication for this.  She has been prescribed Myrbetriq in the past which she never started.  Pelvic health physical therapy was previously recommended as well, which she did not want to do.  Patient reports a rash on both hands that started recently after washing dishes.  She feels she may have changed her dish soap.  She does not feel like she is in water with her hands frequently.  She does have a history of eczema for which she has betamethasone cream.   ROS: Negative unless specifically indicated above in HPI.   Relevant past medical history reviewed and updated as indicated.   Allergies and medications reviewed and updated.   Current Outpatient Medications:     Ascorbic Acid 500 MG CAPS, Take by mouth., Disp: , Rfl:    augmented betamethasone dipropionate (DIPROLENE-AF) 0.05 % cream, Apply topically 2 (two) times daily., Disp: 30 g, Rfl: 0   Biotin 5000 MCG TABS, Take 5,000 mcg by mouth daily., Disp: , Rfl:    calcium carbonate (TUMS - DOSED IN MG ELEMENTAL CALCIUM) 500 MG chewable tablet, Chew 1 tablet by mouth as needed for indigestion or heartburn., Disp: , Rfl:    losartan (COZAAR) 25 MG tablet, Take 1 tablet (25 mg total) by mouth 2 (two) times daily., Disp: 180 tablet, Rfl: 3   Multiple Vitamin (MULTI-VITAMIN) tablet, Take by mouth., Disp: , Rfl:    mupirocin ointment (BACTROBAN) 2 %, Apply 1 application topically 2 (two) times daily., Disp: 22 g, Rfl: 0   Omega-3 1000 MG CAPS, Take by mouth., Disp: , Rfl:    vitamin B-12 (CYANOCOBALAMIN) 100 MCG tablet, Take 100 mcg by mouth daily., Disp: , Rfl:   Allergies  Allergen Reactions   Calcitonin (Salmon) Other (See Comments)    LEFT BUNDLE BRANCH BLOCK   Estradiol Other (See Comments)    Headache   Fexofenadine Other (See Comments)    Syncope   Miacalcin [Calcitonin] Palpitations   Nsaids Other (See Comments)    Stomach ulcer after taking for 3 days in 2009. Negative H. Pylori test.   Diphenhydramine Other (See Comments)    Syncope   Amoxicillin-Pot Clavulanate Rash    Able to take plain amoxicillin   Ciprofloxacin Rash  Clarithromycin Other (See Comments)    CAN TAKE AZITHROMYCIN   Codeine Other (See Comments)   Doxepin Hcl Other (See Comments)    Heart arrhythmia   Epinephrine Other (See Comments)   Erythromycin Other (See Comments)    Stomach irritation   Fluticasone Other (See Comments)   Fluticasone Propionate Other (See Comments)    And similar corticoid steroids   Ipratropium Bromide Other (See Comments)    Syncope   Latex Rash   Lisinopril Other (See Comments)    Other Reaction: BRUISING, CANKERS & ARTHRALGIAS   Nitrofurantoin Other (See Comments)    Multiple mild  symptoms - would prefer not to take again   Other Other (See Comments)    Paraphenylendiamine/propanol amine/anti-cholinergic compounds   Sulfa Antibiotics Other (See Comments)   Tetracycline Other (See Comments)    Mouth sores after just 2 doses. DOXYCYCLINE is OK per patient    Objective:   BP (!) 156/68   Pulse 69   Temp (!) 97.2 F (36.2 C) (Temporal)   Ht 4\' 10"  (1.473 m)   Wt 126 lb 12.8 oz (57.5 kg)   SpO2 99%   BMI 26.50 kg/m    Physical Exam Vitals reviewed.  Constitutional:      General: She is not in acute distress.    Appearance: Normal appearance. She is not ill-appearing, toxic-appearing or diaphoretic.  HENT:     Head: Normocephalic and atraumatic.  Eyes:     General: No scleral icterus.       Right eye: No discharge.        Left eye: No discharge.     Conjunctiva/sclera: Conjunctivae normal.  Cardiovascular:     Rate and Rhythm: Normal rate.  Pulmonary:     Effort: Pulmonary effort is normal. No respiratory distress.  Musculoskeletal:        General: Normal range of motion.     Cervical back: Normal range of motion.  Skin:    General: Skin is warm and dry.     Capillary Refill: Capillary refill takes less than 2 seconds.     Findings: Rash (bilateral hands and wrists) present.  Neurological:     General: No focal deficit present.     Mental Status: She is alert and oriented to person, place, and time. Mental status is at baseline.  Psychiatric:        Mood and Affect: Mood normal.        Behavior: Behavior normal.        Thought Content: Thought content normal.        Judgment: Judgment normal.

## 2022-01-05 ENCOUNTER — Ambulatory Visit: Payer: Medicare Other | Admitting: Family Medicine

## 2022-01-07 ENCOUNTER — Ambulatory Visit: Payer: Medicare Other | Admitting: Family Medicine

## 2022-01-11 NOTE — Progress Notes (Unsigned)
Cardiology Office Note   Date:  01/13/2022   ID:  Heidi Dorsey, Heidi Dorsey 06/11/1930, MRN [id number]  PCP:  Gwenlyn Fudge, FNP  Cardiologist:   None Referring:  Gwenlyn Fudge, FNP  Chief Complaint  Patient presents with   Decreased balance     History of Present Illness: Heidi Dorsey is a 88 y.o. female who presents for evaluation of palpitations.    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  She had a long history of left bundle branch block.  She has had a cardiac catheterization in 2000.  She does not remember the results but was not told that she had vascular disease.  I see that she has had carotid Dopplers which were unremarkable.  She had a very mildly reduced ejection fraction on the stress echo in 2017 with her EF being 50%.  She has had a 24-hour Holter.  She previously has been treated with metoprolol.  Most recently she was on Cozaar 12.5 mg daily but this was discontinued because her blood pressures were running low.   She also called back and she was had an eye bleed on ARB so she did not want to start this.  She has also been intolerant of amlodipine.  She cannot take ACE inhibitors.  She seemed to be tolerating beta blocker so at the last visit I was prescribing this.  I had suggested hydralazine PRN SBP greater than 160.  She called back because she did not want to take the hydralazine because she read it would worsen her asthma.    When we spoke to her in June she was taking Losartan in the AM and metoprolol in the evening.  She was back to taking hydralazine but because a pharmacist told her it did not make sense to take it PRN she was taking it daily.  She did not want to be seen in the HTN clinic.  I tried to schedule her to see me in [place] as there were no appts in [place] and she would not do this.  Her blood pressure has been labile.  At the last visit she seemed to be doing better and we settled on 25 mg twice daily Cozaar.  She  called again in July with her blood pressure being out of control.  She presents for follow up.  She says today that she does not anything about the hydralazine.  She saw that it was just as needed and she was not even sure what it was for.  I went through this with her again today.  She has other complaints including balance and numbness in her feet and hands.  Her blood pressure systolic seems to be typically 150s in the morning.   She has not having any new symptoms such as chest pressure, neck or arm discomfort.  She is not having any new palpitations, presyncope or syncope.   Past Medical History:  Diagnosis Date   Asthma    BPPV (benign paroxysmal positional vertigo)    Claustrophobia    Diverticulosis    GERD (gastroesophageal reflux disease)    H/O degenerative disc disease    Hiatal hernia    Hyperlipidemia    Hypertension    Kidney stones    Migraines    Osteoarthritis    Caused feet deformity   Osteopenia    Prediabetes    Preglaucoma    Raynauds syndrome    Rectocele  Rosacea    Sciatica    Sleep apnea    Spinal stenosis    Vertebrobasilar artery syndrome     Past Surgical History:  Procedure Laterality Date   ABDOMINAL HYSTERECTOMY  1978   ANTERIOR (CYSTOCELE) AND POSTERIOR REPAIR (RECTOCELE) WITH XENFORM GRAFT AND SACROSPINOUS FIXATION     BILATERAL OOPHORECTOMY  1997   bladder stones     kidney colic/ procedure performed in 1973 at [facility]   BLADDER SUSPENSION     BREAST BIOPSY Left 12/1987   neg   BREAST BIOPSY Right 02/1977   neg papilloma   CARDIAC CATHETERIZATION  2000   HERNIA REPAIR     plantar fibroma  Left 09/1997   Excised     Current Outpatient Medications  Medication Sig Dispense Refill   Ascorbic Acid 500 MG CAPS Take by mouth.     Biotin 5000 MCG TABS Take 5,000 mcg by mouth daily.     calcium carbonate (TUMS - DOSED IN MG ELEMENTAL CALCIUM) 500 MG chewable tablet Chew 1 tablet by mouth as needed for indigestion or heartburn.      hydrALAZINE (APRESOLINE) 10 MG tablet Take 1 tablet (10 mg total) by mouth as needed. Take (1) tablet as needed for blood pressure sustained of 170 or greater systolic 30 tablet 3   losartan (COZAAR) 25 MG tablet Take 1 tablet (25 mg total) by mouth 2 (two) times daily. 180 tablet 3   losartan (COZAAR) 25 MG tablet Take 0.5 tablets (12.5 mg total) by mouth every morning. Take 1/2 tablet with a full 25 mg tablet every morning for a total of 37.5 mg every morning 45 tablet 3   Multiple Vitamin (MULTI-VITAMIN) tablet Take by mouth.     Omega-3 1000 MG CAPS Take by mouth.     vitamin B-12 (CYANOCOBALAMIN) 100 MCG tablet Take 100 mcg by mouth daily.     No current facility-administered medications for this visit.    Allergies:   Calcitonin (salmon), Estradiol, Fexofenadine, Miacalcin [calcitonin], Nsaids, Diphenhydramine, Amoxicillin-pot clavulanate, Ciprofloxacin, Clarithromycin, Codeine, Doxepin hcl, Epinephrine, Erythromycin, Fluticasone, Fluticasone propionate, Ipratropium bromide, Latex, Lisinopril, Nitrofurantoin, Other, Sulfa antibiotics, and Tetracycline    ROS:  Please see the history of present illness.   Otherwise, review of systems are positive for insomnia and interrupted sleep.   All other systems are reviewed and negative.    PHYSICAL EXAM: VS:  BP (!) 158/68   Pulse 63   Ht 4\' 10"  (1.473 m)   Wt 128 lb (58.1 kg)   BMI 26.75 kg/m  , BMI Body mass index is 26.75 kg/m. GENERAL:  Well appearing NECK:  No jugular venous distention, waveform within normal limits, carotid upstroke brisk and symmetric, no bruits, no thyromegaly LUNGS:  Clear to auscultation bilaterally CHEST:  Unremarkable HEART:  PMI not displaced or sustained,S1 and S2 within normal limits, no S3, no S4, no clicks, no rubs, no murmurs ABD:  Flat, positive bowel sounds normal in frequency in pitch, no bruits, no rebound, no guarding, no midline pulsatile mass, no hepatomegaly, no splenomegaly EXT:  2 plus pulses  throughout, no edema, no cyanosis no clubbing   EKG:  EKG is not ordered today.  Recent Labs: 06/30/2021: ALT 16; BUN 21; Creatinine, Ser 0.79; Hemoglobin 13.1; Platelets 183; Potassium 4.7; Sodium 139    Lipid Panel    Component Value Date/Time   CHOL 200 (H) 06/30/2021 1441   TRIG 47 06/30/2021 1441   HDL 91 06/30/2021 1441   CHOLHDL 2.2 06/30/2021 1441  LDLCALC 100 (H) 06/30/2021 1441      Wt Readings from Last 3 Encounters:  01/13/22 128 lb (58.1 kg)  11/24/21 126 lb 12.8 oz (57.5 kg)  10/07/21 129 lb (58.5 kg)      Other studies Reviewed: Additional studies/ records that were reviewed today include: NA  Review of the above records demonstrates: NA   ASSESSMENT AND PLAN:  PALPITATIONS:    She does not feel any ectopy.  No change in therapy.  HTN:     The blood pressure is still slightly elevated and labile.  I reiterated with her why we would use as needed hydralazine and we will make sure we write it down the after visit summary that this should be for systolic blood pressures that are still sustained at 170.  I will increase her losartan to 37.5 mg in the morning and 20 5 in the afternoon.  Current medicines are reviewed at length with the patient today.  The patient does not have concerns regarding medicines.  The following changes have been made: As above  Labs/ tests ordered today include: None  No orders of the defined types were placed in this encounter.     Disposition:   FU with me  in 6 months   Signed, Minus Breeding, MD  01/13/2022 12:09 PM    [place]

## 2022-01-13 ENCOUNTER — Encounter: Payer: Self-pay | Admitting: Cardiology

## 2022-01-13 ENCOUNTER — Ambulatory Visit (INDEPENDENT_AMBULATORY_CARE_PROVIDER_SITE_OTHER): Payer: Medicare Other | Admitting: Cardiology

## 2022-01-13 VITALS — BP 158/68 | HR 63 | Ht <= 58 in | Wt 128.0 lb

## 2022-01-13 DIAGNOSIS — R002 Palpitations: Secondary | ICD-10-CM

## 2022-01-13 DIAGNOSIS — I10 Essential (primary) hypertension: Secondary | ICD-10-CM | POA: Diagnosis not present

## 2022-01-13 MED ORDER — LOSARTAN POTASSIUM 25 MG PO TABS: 12.5000 mg | ORAL_TABLET | Freq: Every morning | ORAL | 3 refills | Status: DC

## 2022-01-13 MED ORDER — HYDRALAZINE HCL 10 MG PO TABS: 10.0000 mg | ORAL_TABLET | ORAL | 3 refills | Status: DC | PRN

## 2022-01-13 NOTE — Patient Instructions (Signed)
Medication Instructions:  Please increase your Cozaar to 37.5 mg in the morning and continue 25 mg at 5 pm. Take Hydralazine 10 mg as needed for a sustained systolic blood pressure of 170 or higher. Continue all other medications as listed.  *If you need a refill on your cardiac medications before your next appointment, please call your pharmacy*  Follow-Up: At [facility], you and your health needs are our priority.  As part of our continuing mission to provide you with exceptional heart care, we have created designated Provider Care Teams.  These Care Teams include your primary Cardiologist (physician) and Advanced Practice Providers (APPs -  Physician Assistants and Nurse Practitioners) who all work together to provide you with the care you need, when you need it.  We recommend signing up for the patient portal called "MyChart".  Sign up information is provided on this After Visit Summary.  MyChart is used to connect with patients for Virtual Visits (Telemedicine).  Patients are able to view lab/test results, encounter notes, upcoming appointments, etc.  Non-urgent messages can be sent to your provider as well.   To learn more about what you can do with MyChart, go to [email or web address].    Your next appointment:   6 month(s)  The format for your next appointment:   In Person  Provider:   Minus Breeding, MD    Important Information About Sugar

## 2022-01-27 ENCOUNTER — Emergency Department (HOSPITAL_COMMUNITY)
Admission: EM | Admit: 2022-01-27 | Discharge: 2022-01-27 | Disposition: A | Discharge status: Home / Self Care | Payer: Medicare Other | Attending: Emergency Medicine | Admitting: Emergency Medicine

## 2022-01-27 ENCOUNTER — Emergency Department (HOSPITAL_COMMUNITY): Payer: Medicare Other

## 2022-01-27 ENCOUNTER — Other Ambulatory Visit: Payer: Self-pay

## 2022-01-27 DIAGNOSIS — Z79899 Other long term (current) drug therapy: Secondary | ICD-10-CM | POA: Insufficient documentation

## 2022-01-27 DIAGNOSIS — Z91040 Latex allergy status: Secondary | ICD-10-CM | POA: Diagnosis not present

## 2022-01-27 DIAGNOSIS — I10 Essential (primary) hypertension: Secondary | ICD-10-CM | POA: Diagnosis not present

## 2022-01-27 DIAGNOSIS — I480 Paroxysmal atrial fibrillation: Secondary | ICD-10-CM | POA: Insufficient documentation

## 2022-01-27 DIAGNOSIS — R0602 Shortness of breath: Secondary | ICD-10-CM | POA: Diagnosis present

## 2022-01-27 LAB — COMPREHENSIVE METABOLIC PANEL
ALT: 16 U/L (ref 0–44)
AST: 19 U/L (ref 15–41)
Albumin: 3.4 g/dL — ABNORMAL LOW (ref 3.5–5.0)
Alkaline Phosphatase: 57 U/L (ref 38–126)
Anion gap: 6 (ref 5–15)
BUN: 13 mg/dL (ref 8–23)
CO2: 24 mmol/L (ref 22–32)
Calcium: 8.2 mg/dL — ABNORMAL LOW (ref 8.9–10.3)
Chloride: 105 mmol/L (ref 98–111)
Creatinine, Ser: 0.62 mg/dL (ref 0.44–1.00)
GFR, Estimated: >60 mL/min (ref >60)
Glucose, Bld: 102 mg/dL — ABNORMAL HIGH (ref 70–99)
Potassium: 3.5 mmol/L (ref 3.5–5.1)
Sodium: 135 mmol/L (ref 135–145)
Total Bilirubin: 0.8 mg/dL (ref 0.3–1.2)
Total Protein: 5.8 g/dL — ABNORMAL LOW (ref 6.5–8.1)

## 2022-01-27 LAB — PROTIME-INR
INR: 1.1 (ref 0.8–1.2)
Prothrombin Time: 14.3 seconds (ref 11.4–15.2)

## 2022-01-27 LAB — CBC WITH DIFFERENTIAL/PLATELET
Abs Immature Granulocytes: 0.03 10*3/uL (ref 0.00–0.07)
Basophils Absolute: 0.1 10*3/uL (ref 0.0–0.1)
Basophils Relative: 1 %
Eosinophils Absolute: 0 10*3/uL (ref 0.0–0.5)
Eosinophils Relative: 1 %
HCT: 37.1 % (ref 36.0–46.0)
Hemoglobin: 12.3 g/dL (ref 12.0–15.0)
Immature Granulocytes: 1 %
Lymphocytes Relative: 15 %
Lymphs Abs: 1 10*3/uL (ref 0.7–4.0)
MCH: 29.8 pg (ref 26.0–34.0)
MCHC: 33.2 g/dL (ref 30.0–36.0)
MCV: 89.8 fL (ref 80.0–100.0)
Monocytes Absolute: 0.6 10*3/uL (ref 0.1–1.0)
Monocytes Relative: 8 %
Neutro Abs: 5 10*3/uL (ref 1.7–7.7)
Neutrophils Relative %: 74 %
Platelets: 177 10*3/uL (ref 150–400)
RBC: 4.13 MIL/uL (ref 3.87–5.11)
RDW: 14.9 % (ref 11.5–15.5)
WBC: 6.6 10*3/uL (ref 4.0–10.5)
nRBC: 0 % (ref 0.0–0.2)

## 2022-01-27 LAB — TROPONIN I (HIGH SENSITIVITY): Troponin I (High Sensitivity): 17 ng/L (ref <18)

## 2022-01-27 MED ORDER — AMIODARONE LOAD VIA INFUSION
150.0000 mg | Freq: Once | INTRAVENOUS | Status: DC
  Filled 2022-01-27: qty 83.34

## 2022-01-27 MED ORDER — SODIUM CHLORIDE 0.9 % IV BOLUS
1000.0000 mL | Freq: Once | INTRAVENOUS | Status: AC
  Administered 2022-01-27: 1000 mL via INTRAVENOUS

## 2022-01-27 MED ORDER — AMIODARONE HCL IN DEXTROSE 360-4.14 MG/200ML-% IV SOLN
60.0000 mg/h | INTRAVENOUS | Status: DC
  Filled 2022-01-27: qty 200

## 2022-01-27 MED ORDER — AMIODARONE HCL IN DEXTROSE 360-4.14 MG/200ML-% IV SOLN: 30.0000 mg/h | INTRAVENOUS | Status: DC

## 2022-01-27 NOTE — Discharge Instructions (Signed)
Follow-up with your cardiologist in the next 2 weeks because you had that rapid heart rate.

## 2022-01-27 NOTE — ED Triage Notes (Signed)
Pt via EMS from home c/o sudden onset substernal chest pain at 1pm with associated SOB. EMS administered 1 ntg tab prior to arrival which resolved chest pain. 12 lead en route showed a fib; pt denies knowledge of previous a fib diagnosis. A/O x 4, no pain at time of arrival.

## 2022-01-27 NOTE — ED Provider Notes (Signed)
[facility] EMERGENCY DEPARTMENT Provider Note   CSN: [id number] Arrival date & time: 01/27/22  1309     History {Add pertinent medical, surgical, social history, OB history to HPI:1} Chief Complaint  Patient presents with   Chest Pain    Heidi Dorsey is a 88 y.o. female.  Patient has a history of hypertension.  Patient comes in because of chest discomfort.   Chest Pain      Home Medications Prior to Admission medications   Medication Sig Start Date End Date Taking? Authorizing Provider  Ascorbic Acid 500 MG CAPS Take by mouth.    [provider]  Biotin 5000 MCG TABS Take 5,000 mcg by mouth daily.    [provider]  calcium carbonate (TUMS - DOSED IN MG ELEMENTAL CALCIUM) 500 MG chewable tablet Chew 1 tablet by mouth as needed for indigestion or heartburn.    [provider]  hydrALAZINE (APRESOLINE) 10 MG tablet Take 1 tablet (10 mg total) by mouth as needed. Take (1) tablet as needed for blood pressure sustained of 170 or greater systolic 95/6/21   Minus Breeding, MD  losartan (COZAAR) 25 MG tablet Take 1 tablet (25 mg total) by mouth 2 (two) times daily. 10/26/21   Minus Breeding, MD  losartan (COZAAR) 25 MG tablet Take 0.5 tablets (12.5 mg total) by mouth every morning. Take 1/2 tablet with a full 25 mg tablet every morning for a total of 37.5 mg every morning 01/13/22   Minus Breeding, MD  Multiple Vitamin (MULTI-VITAMIN) tablet Take by mouth.    [provider]  Omega-3 1000 MG CAPS Take by mouth. 08/25/09   [provider]  vitamin B-12 (CYANOCOBALAMIN) 100 MCG tablet Take 100 mcg by mouth daily.    [provider]      Allergies    Calcitonin (salmon), Estradiol, Fexofenadine, Miacalcin [calcitonin], Nsaids, Diphenhydramine, Amoxicillin-pot clavulanate, Ciprofloxacin, Clarithromycin, Codeine, Doxepin hcl, Epinephrine, Erythromycin, Fluticasone, Fluticasone propionate, Ipratropium bromide, Latex, Lisinopril,  Nitrofurantoin, Other, Sulfa antibiotics, and Tetracycline    Review of Systems   Review of Systems  Cardiovascular:  Positive for chest pain.    Physical Exam Updated Vital Signs BP (!) 146/71   Pulse 65   Temp 98.1 F (36.7 C)   Resp (!) 22   Ht 4\' 10"  (1.473 m)   Wt 56.7 kg   SpO2 100%   BMI 26.13 kg/m  Physical Exam  ED Results / Procedures / Treatments   Labs (all labs ordered are listed, but only abnormal results are displayed) Labs Reviewed  COMPREHENSIVE METABOLIC PANEL - Abnormal; Notable for the following components:      Result Value   Glucose, Bld 102 (*)    Calcium 8.2 (*)    Total Protein 5.8 (*)    Albumin 3.4 (*)    All other components within normal limits  CBC WITH DIFFERENTIAL/PLATELET  PROTIME-INR  TROPONIN I (HIGH SENSITIVITY)    EKG EKG Interpretation  Date/Time:  Wednesday January 27 2022 14:32:01 EDT Ventricular Rate:  84 PR Interval:  211 QRS Duration: 137 QT Interval:  425 QTC Calculation: 503 R Axis:   38 Text Interpretation: Sinus rhythm Left bundle branch block Confirmed by Milton Ferguson [phone]) on 01/27/2022 2:45:58 PM  Radiology DG Chest Port 1 View  Result Date: 01/27/2022 CLINICAL DATA:  Shortness of breath EXAM: PORTABLE CHEST 1 VIEW COMPARISON:  None Available. FINDINGS: Heart size is upper limits of normal. Aortic atherosclerosis. Hyperinflated lungs. No focal airspace consolidation, pleural effusion, or pneumothorax.  IMPRESSION: No active disease. Electronically Signed   By: Davina Poke D.O.   On: 01/27/2022 14:37    Procedures Procedures  {Document cardiac monitor, telemetry assessment procedure when appropriate:1}  Medications Ordered in ED Medications  amiodarone (NEXTERONE) 1.8 mg/mL load via infusion 150 mg (0 mg Intravenous Hold 01/27/22 1436)    Followed by  amiodarone (NEXTERONE PREMIX) 360-4.14 MG/200ML-% (1.8 mg/mL) IV infusion (60 mg/hr Intravenous Not Given 01/27/22 1513)    Followed by  amiodarone  (NEXTERONE PREMIX) 360-4.14 MG/200ML-% (1.8 mg/mL) IV infusion (has no administration in time range)  sodium chloride 0.9 % bolus 1,000 mL (1,000 mLs Intravenous New Bag/Given 01/27/22 1435)    ED Course/ Medical Decision Making/ A&P  I spoke to cardiology about the patient and initially working to start amiodarone but she converted and will follow-up as an outpatient                         Medical Decision Making Amount and/or Complexity of Data Reviewed Labs: ordered. Radiology: ordered.  Risk Prescription drug management.   Patient with rapid atrial fibs that has resolved.  She will follow-up with cardiology.  {Document critical care time when appropriate:1} {Document review of labs and clinical decision tools ie heart score, Chads2Vasc2 etc:1}  {Document your independent review of radiology images, and any outside records:1} {Document your discussion with family members, caretakers, and with consultants:1} {Document social determinants of health affecting pt's care:1} {Document your decision making why or why not admission, treatments were needed:1} Final Clinical Impression(s) / ED Diagnoses Final diagnoses:  Paroxysmal atrial fibrillation ([place])    Rx / DC Orders ED Discharge Orders     None

## 2022-01-27 NOTE — ED Notes (Signed)
Pt ambulated to restroom and self converted to NSR 81 bpm. MD notified. Will hold amiodarone until MD issues new instructions.

## 2022-01-30 NOTE — Progress Notes (Unsigned)
Cardiology Office Note   Date:  02/02/2022   ID:  Tere, Baar [birth year], MRN [id number]  PCP:  Loman Brooklyn, FNP  Cardiologist:   None Referring:  Loman Brooklyn, FNP  Chief Complaint  Patient presents with   Atrial Fibrillation     History of Present Illness: Heidi Dorsey is a 88 y.o. female who presents for evaluation of palpitations.    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  She had a long history of left bundle branch block.  She has had a cardiac catheterization in 2000.  She does not remember the results but was not told that she had vascular disease.  I see that she has had carotid Dopplers which were unremarkable.  She had a very mildly reduced ejection fraction on the stress echo in 2017 with her EF being 50%.  She has had a 24-hour Holter.  She previously has been treated with metoprolol.  Most recently she was on Cozaar 12.5 mg daily but this was discontinued because her blood pressures were running low.   She also called back and she was had an eye bleed on ARB so she did not want to start this.  She has also been intolerant of amlodipine.  She cannot take ACE inhibitors.  She seemed to be tolerating beta blocker so at the last visit I was prescribing this.  I had suggested hydralazine PRN SBP greater than 160.  She called back because she did not want to take the hydralazine because she read it would worsen her asthma.    When we spoke to her in June she was taking Losartan in the AM and metoprolol in the evening.  She was back to taking hydralazine but because a pharmacist told her it did not make sense to take it PRN she was taking it daily.  She did not want to be seen in the HTN clinic.  I tried to schedule her to see me in [place] as there were no appts in [place] and she would not do this.  Her blood pressure has been labile.   She was in the ED last week with chest pain.  She had PAF.  She converted spontaneously.   She  said that that day she was fatigued.  She has nocturia and gets up about every hour so she does not sleep well and she thought she was particularly tired with this.  She then got presyncopal.  She noticed her heart racing.  She cannot say exactly when the onset was.  She called EMS.  Heart rate was 145.  She has a chronic left bundle branch block.  Enzymes were negative.  She spontaneously converted to sinus before they were about to administer amiodarone.  She has not felt like this before or since.  She has had some loss of balance recently.  She has not had any other episodes of presyncope and has had no syncope.  She has not had any chest pressure, neck or arm discomfort.  She had no weight gain or edema.   Past Medical History:  Diagnosis Date   Asthma    BPPV (benign paroxysmal positional vertigo)    Claustrophobia    Diverticulosis    GERD (gastroesophageal reflux disease)    H/O degenerative disc disease    Hiatal hernia    Hyperlipidemia    Hypertension    Kidney stones    Migraines    Osteoarthritis  Caused feet deformity   Osteopenia    Prediabetes    Preglaucoma    Raynauds syndrome    Rectocele    Rosacea    Sciatica    Sleep apnea    Spinal stenosis    Vertebrobasilar artery syndrome     Past Surgical History:  Procedure Laterality Date   ABDOMINAL HYSTERECTOMY  1978   ANTERIOR (CYSTOCELE) AND POSTERIOR REPAIR (RECTOCELE) WITH XENFORM GRAFT AND SACROSPINOUS FIXATION     BILATERAL OOPHORECTOMY  1997   bladder stones     kidney colic/ procedure performed in 1973 at [facility] Left 12/1987   neg   BREAST BIOPSY Right 02/1977   neg papilloma   CARDIAC CATHETERIZATION  2000   HERNIA REPAIR     plantar fibroma  Left 09/1997   Excised     Current Outpatient Medications  Medication Sig Dispense Refill   diltiazem (CARDIZEM) 30 MG tablet Take 1 tablet (30 mg total) by mouth 2 (two) times daily as needed (For a heart rate over  100 bpm). 30 tablet 2   Ascorbic Acid 500 MG CAPS Take by mouth.     Biotin 5000 MCG TABS Take 5,000 mcg by mouth daily.     calcium carbonate (TUMS - DOSED IN MG ELEMENTAL CALCIUM) 500 MG chewable tablet Chew 1 tablet by mouth as needed for indigestion or heartburn.     hydrALAZINE (APRESOLINE) 10 MG tablet Take 1 tablet (10 mg total) by mouth as needed. Take (1) tablet as needed for blood pressure sustained of 170 or greater systolic 30 tablet 3   losartan (COZAAR) 25 MG tablet Take 1 tablet (25 mg total) by mouth 2 (two) times daily. 180 tablet 3   losartan (COZAAR) 25 MG tablet Take 0.5 tablets (12.5 mg total) by mouth every morning. Take 1/2 tablet with a full 25 mg tablet every morning for a total of 37.5 mg every morning 45 tablet 3   Multiple Vitamin (MULTI-VITAMIN) tablet Take by mouth.     Omega-3 1000 MG CAPS Take by mouth.     vitamin B-12 (CYANOCOBALAMIN) 100 MCG tablet Take 100 mcg by mouth daily.     No current facility-administered medications for this visit.    Allergies:   Calcitonin (salmon), Estradiol, Fexofenadine, Miacalcin [calcitonin], Nsaids, Diphenhydramine, Amoxicillin-pot clavulanate, Ciprofloxacin, Clarithromycin, Codeine, Doxepin hcl, Epinephrine, Erythromycin, Fluticasone, Fluticasone propionate, Ipratropium bromide, Latex, Lisinopril, Nitrofurantoin, Other, Sulfa antibiotics, and Tetracycline    ROS:  Please see the history of present illness.   Otherwise, review of systems are positive for none.   All other systems are reviewed and negative.    PHYSICAL EXAM: VS:  BP 138/62   Pulse 64   Ht 4\' 10"  (1.473 m)   Wt 128 lb (58.1 kg)   SpO2 97%   BMI 26.75 kg/m  , BMI Body mass index is 26.75 kg/m. GENERAL:  Well appearing NECK:  No jugular venous distention, waveform within normal limits, carotid upstroke brisk and symmetric, no bruits, no thyromegaly LUNGS:  Clear to auscultation bilaterally CHEST:  Unremarkable HEART:  PMI not displaced or sustained,S1  and S2 within normal limits, no S3, no S4, no clicks, no rubs, no murmurs, irregular, 2 out of 6 apical systolic murmur radiating slightly to the axilla and possibly holosystolic, no diastolic ABD:  Flat, positive bowel sounds normal in frequency in pitch, no bruits, no rebound, no guarding, no midline pulsatile mass, no hepatomegaly, no splenomegaly EXT:  2 plus pulses throughout, no edema, no cyanosis no clubbing   EKG:  EKG is  ordered today. Sinus rhythm, rate 64, left bundle branch block, no acute ST-T wave changes.    Recent Labs: 01/27/2022: ALT 16; BUN 13; Creatinine, Ser 0.62; Hemoglobin 12.3; Platelets 177; Potassium 3.5; Sodium 135    Lipid Panel    Component Value Date/Time   CHOL 200 (H) 06/30/2021 1441   TRIG 47 06/30/2021 1441   HDL 91 06/30/2021 1441   CHOLHDL 2.2 06/30/2021 1441   LDLCALC 100 (H) 06/30/2021 1441      Wt Readings from Last 3 Encounters:  02/02/22 128 lb (58.1 kg)  01/27/22 125 lb (56.7 kg)  01/13/22 128 lb (58.1 kg)      Other studies Reviewed: Additional studies/ records that were reviewed today include: ED records Review of the above records demonstrates: See elsewhere   ASSESSMENT AND PLAN:  ATRIAL FIB:   The patient now has had 1 episode of paroxysmal atrial fibrillation.  She has been very sensitive to medications in the past.  She would be very hesitant to take anticoagulation and she is a bit of a fall risk.  Given this is the first episode I will start this.  I am going to do a 2-week monitor.  I will check a TSH.  She will get a pulse oximeter and Fitbit to check on her own heart rate.  I will check an echocardiogram.  I suspect she will have recurrent fibrillation and she knows to call 911 again if this happens.  If it does we will have to consider further therapy.  If she has recurrent fibrillation I would move quickly to watchman if she would consent.  HTN:     The blood pressure is well controlled. No change in therapy.     MURMUR:  I will check the echo as above.   FREQUENT URINATION:  I am going to have her see urology.    Current medicines are reviewed at length with the patient today.  The patient does not have concerns regarding medicines.  The following changes have been made:   As above  Labs/ tests ordered today include:       Orders Placed This Encounter  Procedures   TSH   Ambulatory referral to Urology   LONG TERM MONITOR (3-14 DAYS)   ECHOCARDIOGRAM COMPLETE     Disposition:   FU with me  after the monitor in [place].    Signed, Minus Breeding, MD  02/02/2022 2:11 PM    [place]

## 2022-02-01 ENCOUNTER — Telehealth: Payer: Self-pay | Admitting: Cardiology

## 2022-02-01 NOTE — Telephone Encounter (Signed)
Returned call to patient left message on voice mail I will send message to Dr.Hochrein to review records from [facility].Advised to keep appointment scheduled with Dr.Hochrein 10/24 at 1:40 pm.

## 2022-02-01 NOTE — Telephone Encounter (Signed)
  Pt would like to request if Dr. Percival Spanish can check her results and records from [facility], she was there last week 01/27/22

## 2022-02-02 ENCOUNTER — Ambulatory Visit: Payer: Medicare Other | Attending: Cardiology | Admitting: Cardiology

## 2022-02-02 ENCOUNTER — Encounter: Payer: Self-pay | Admitting: Cardiology

## 2022-02-02 ENCOUNTER — Ambulatory Visit: Payer: Medicare Other

## 2022-02-02 VITALS — BP 138/62 | HR 64 | Ht <= 58 in | Wt 128.0 lb

## 2022-02-02 DIAGNOSIS — R351 Nocturia: Secondary | ICD-10-CM

## 2022-02-02 DIAGNOSIS — I480 Paroxysmal atrial fibrillation: Secondary | ICD-10-CM

## 2022-02-02 DIAGNOSIS — I10 Essential (primary) hypertension: Secondary | ICD-10-CM | POA: Diagnosis not present

## 2022-02-02 MED ORDER — DILTIAZEM HCL 30 MG PO TABS: 30.0000 mg | ORAL_TABLET | Freq: Two times a day (BID) | ORAL | 2 refills | Status: DC | PRN

## 2022-02-02 NOTE — Patient Instructions (Signed)
Medication Instructions:  TAKE Cardizem 30 mg twice daily as needed for a heart rate over 100 bpm  *If you need a refill on your cardiac medications before your next appointment, please call your pharmacy*   Lab Work: Your provider would like for you to have the following labs today: TSH  If you have labs (blood work) drawn today and your tests are completely normal, you will receive your results only by: [place] (if you have MyChart) OR A paper copy in the mail If you have any lab test that is abnormal or we need to change your treatment, we will call you to review the results.   Testing/Procedures: Your physician has requested that you have an echocardiogram. Echocardiography is a painless test that uses sound waves to create images of your heart. It provides your doctor with information about the size and shape of your heart and how well your heart's chambers and valves are working. You may receive an ultrasound enhancing agent through an IV if needed to better visualize your heart during the echo.This procedure takes approximately one hour. There are no restrictions for this procedure. This will take place at the [address], [address].   Follow-Up: At [facility], you and your health needs are our priority.  As part of our continuing mission to provide you with exceptional heart care, we have created designated Provider Care Teams.  These Care Teams include your primary Cardiologist (physician) and Advanced Practice Providers (APPs -  Physician Assistants and Nurse Practitioners) who all work together to provide you with the care you need, when you need it.  We recommend signing up for the patient portal called "MyChart".  Sign up information is provided on this After Visit Summary.  MyChart is used to connect with patients for Virtual Visits (Telemedicine).  Patients are able to view lab/test results, encounter notes, upcoming appointments, etc.  Non-urgent messages  can be sent to your provider as well.   To learn more about what you can do with MyChart, go to [email or web address].    Your next appointment:   2 month(s)  The format for your next appointment:   In Person  Provider:   Dr. Percival Spanish in [place] A referral has been placed to Urology Look into getting a FIT BIT and PULSE OX  Other Instructions ZIO XT- Long Term Monitor Instructions  Your physician has requested you wear a ZIO patch monitor for 14 days.  This is a single patch monitor. [employer] supplies one patch monitor per enrollment. Additional stickers are not available. Please do not apply patch if you will be having a Nuclear Stress Test,  Echocardiogram, Cardiac CT, MRI, or Chest Xray during the period you would be wearing the  monitor. The patch cannot be worn during these tests. You cannot remove and re-apply the  ZIO XT patch monitor.  Your ZIO patch monitor will be mailed 3 day USPS to your address on file. It may take 3-5 days  to receive your monitor after you have been enrolled.  Once you have received your monitor, please review the enclosed instructions. Your monitor  has already been registered assigning a specific monitor serial # to you.  Billing and Patient Assistance Program Information  We have supplied [employer] with any of your insurance information on file for billing purposes. [employer] offers a sliding scale Patient Assistance Program for patients that do not have  insurance, or whose insurance does not completely cover the cost of the  ZIO monitor.  You must apply for the Patient Assistance Program to qualify for this discounted rate.  To apply, please call [employer] at [phone], select option 4, select option 2, ask to apply for  Patient Assistance Program. Theodore Demark will ask your household income, and how many people  are in your household. They will quote your out-of-pocket cost based on that information.  [employer] will also be able to set up a 11-month,  interest-free payment plan if needed.  Applying the monitor   Shave hair from upper left chest.  Hold abrader disc by orange tab. Rub abrader in 40 strokes over the upper left chest as  indicated in your monitor instructions.  Clean area with 4 enclosed alcohol pads. Let dry.  Apply patch as indicated in monitor instructions. Patch will be placed under collarbone on left  side of chest with arrow pointing upward.  Rub patch adhesive wings for 2 minutes. Remove white label marked "1". Remove the white  label marked "2". Rub patch adhesive wings for 2 additional minutes.  While looking in a mirror, press and release button in center of patch. A small green light will  flash 3-4 times. This will be your only indicator that the monitor has been turned on.  Do not shower for the first 24 hours. You may shower after the first 24 hours.  Press the button if you feel a symptom. You will hear a small click. Record Date, Time and  Symptom in the Patient Logbook.  When you are ready to remove the patch, follow instructions on the last 2 pages of Patient  Logbook. Stick patch monitor onto the last page of Patient Logbook.  Place Patient Logbook in the blue and white box. Use locking tab on box and tape box closed  securely. The blue and white box has prepaid postage on it. Please place it in the mailbox as  soon as possible. Your physician should have your test results approximately 7 days after the  monitor has been mailed back to [facility].  Call [place] at [phone] if you have questions regarding  your ZIO XT patch monitor. Call them immediately if you see an orange light blinking on your  monitor.  If your monitor falls off in less than 4 days, contact our Monitor department at [phone].  If your monitor becomes loose or falls off after 4 days call [employer] at [phone] for  suggestions on securing your monitor

## 2022-02-02 NOTE — Progress Notes (Unsigned)
Enrolled patient for a 14 day Zio XT  monitor to be mailed to patients home  °

## 2022-02-03 ENCOUNTER — Telehealth: Payer: Self-pay | Admitting: Cardiology

## 2022-02-03 LAB — TSH: TSH: 1.99 u[IU]/mL (ref 0.450–4.500)

## 2022-02-03 NOTE — Telephone Encounter (Signed)
Patient has questions from her follow-up visit she would like to discuss.

## 2022-02-03 NOTE — Telephone Encounter (Signed)
Spoke with patient who inquired about appointment on 11/13. Explained that 11/13 was for her echo at [facility]. She asked if the echo needed to be at [facility]. I explained that is what was ordered. She then asked about the Zio patch. As I was explaining it to her, she began to yell at me. I asked her not to yell at me and asked if she would like to speak with someone else. She said no, that she is almost 19 and doesn't understand all of this disconnect with her care; that all of this could have been done while she was in the hospital. She hung up on me.

## 2022-02-04 NOTE — Addendum Note (Signed)
Addended by: Hinton Dyer on: 02/04/2022 08:01 AM   Modules accepted: Orders

## 2022-02-05 ENCOUNTER — Telehealth: Payer: Self-pay | Admitting: *Deleted

## 2022-02-05 NOTE — Telephone Encounter (Signed)
Called pt to review recent TSH results.  Pt would like Dr Percival Spanish to be aware today, the sclera in both her eyes are "bloodshot."  She has never had this to happen before, no pain, no chemical or known allergen exposure.  Advised pt to f/u with PCP or ophthalmologist for further evaluation.  Pt reports she has neither available to see at this time.  Advised I will call back with any further advice or recommendations from Dr Percival Spanish.

## 2022-02-09 ENCOUNTER — Telehealth: Payer: Self-pay | Admitting: Cardiology

## 2022-02-09 DIAGNOSIS — R002 Palpitations: Secondary | ICD-10-CM

## 2022-02-09 DIAGNOSIS — I480 Paroxysmal atrial fibrillation: Secondary | ICD-10-CM

## 2022-02-09 NOTE — Telephone Encounter (Signed)
Patient is calling stating she wants to remind Dr. Percival Spanish that she is not in the hospital and lives alone with no one to help her. She states she wants to be compliant with what he recommends for her heart care, but it is too much with what she already has to do for her everyday life. She expressed repetitive concern with her bad eyesight causing her to feel she will not be able to keep track of her HR to know when to take diltiazem. She also stated she refused to accept the heart monitor because she did not order it and never heard of [employer] stating no one told her it was going to be mailed to her. Advised patient this is something the nurse was previously trying to discuss with her on 10/25. From what I understood during my discuss with the patient and further explaining she would like to have the heart monitor mailed again and setup an appointment to have a nurse put it on for her. Patient also mentioned she has an upcoming appointment on 11/07 with a Josie Saunders NP.

## 2022-02-09 NOTE — Telephone Encounter (Signed)
Spoke to patient she stated she refused monitor that was mailed to her home.Stated she thought it was a Risk analyst.Stated she lives alone and she gets over whelmed doing everything by herself.Monitor appointment scheduled same day as echo scheduled.Echo 11/13 at 1:00 pm Monitor at 2:15 pm.

## 2022-02-16 ENCOUNTER — Ambulatory Visit (INDEPENDENT_AMBULATORY_CARE_PROVIDER_SITE_OTHER): Payer: Medicare Other | Admitting: Family Medicine

## 2022-02-16 ENCOUNTER — Other Ambulatory Visit: Payer: Self-pay

## 2022-02-16 ENCOUNTER — Other Ambulatory Visit (INDEPENDENT_AMBULATORY_CARE_PROVIDER_SITE_OTHER): Payer: Medicare Other

## 2022-02-16 ENCOUNTER — Encounter: Payer: Self-pay | Admitting: Family Medicine

## 2022-02-16 VITALS — BP 176/67 | HR 68 | Temp 98.0°F | Ht <= 58 in | Wt 126.8 lb

## 2022-02-16 DIAGNOSIS — R350 Frequency of micturition: Secondary | ICD-10-CM | POA: Diagnosis not present

## 2022-02-16 DIAGNOSIS — J4520 Mild intermittent asthma, uncomplicated: Secondary | ICD-10-CM

## 2022-02-16 DIAGNOSIS — I10 Essential (primary) hypertension: Secondary | ICD-10-CM | POA: Diagnosis not present

## 2022-02-16 DIAGNOSIS — I480 Paroxysmal atrial fibrillation: Secondary | ICD-10-CM

## 2022-02-16 LAB — URINALYSIS, ROUTINE W REFLEX MICROSCOPIC
Bilirubin, UA: NEGATIVE
Glucose, UA: NEGATIVE
Ketones, UA: NEGATIVE
Leukocytes,UA: NEGATIVE
Nitrite, UA: NEGATIVE
Protein,UA: NEGATIVE
RBC, UA: NEGATIVE
Specific Gravity, UA: 1.015 (ref 1.005–1.030)
Urobilinogen, Ur: 0.2 mg/dL (ref 0.2–1.0)
pH, UA: 7 (ref 5.0–7.5)

## 2022-02-16 MED ORDER — MIRABEGRON ER 25 MG PO TB24: 25.0000 mg | ORAL_TABLET | Freq: Every day | ORAL | 3 refills | Status: DC

## 2022-02-16 MED ORDER — ALBUTEROL SULFATE HFA 108 (90 BASE) MCG/ACT IN AERS: 2.0000 | INHALATION_SPRAY | Freq: Four times a day (QID) | RESPIRATORY_TRACT | 2 refills | Status: DC | PRN

## 2022-02-16 NOTE — Progress Notes (Signed)
Subjective:  Patient ID: Heidi Dorsey, female    DOB: 08-Dec-1930, 88 y.o.   MRN: [id number]  Patient Care Team: Baruch Gouty, FNP as PCP - General (Family Medicine) Alda Berthold, DO as Consulting Physician (Neurology)   Chief Complaint:  Establish Care Blanch Media patient /), Medical Management of Chronic Issues, and Urinary Frequency (X 1 month )   HPI: Heidi Dorsey is a 88 y.o. female presenting on 02/16/2022 for [place] Blanch Media patient /), Medical Management of Chronic Issues, and Urinary Frequency (X 1 month )   Pt presents today for management of chronic medical conditions. She was recently diagnosed with PAF and has seen cardiology for follow up. A ZIO event monitor was ordered. Pt states when it arrive to her house, she declined it as she was not sure of what it was. She does have an echo scheduled. She denies any recurrent palpitations. No chest pain, shortness of breath, fatigue, dizziness, weakness, or syncope. She has PRN cardizem for sustained HR above 100. States she was unaware of this and has not been checking her blood pressure or HR at home. She also has as needed hydralazine for systolic persistently above 170, states she has not been taking this either.   Urinary Frequency  This is a chronic problem. The current episode started more than 1 month ago. Episode frequency: nocturia, no frequency during the day. The patient is experiencing no pain. There has been no fever. She is Not sexually active. There is No history of pyelonephritis. Associated symptoms include frequency. Pertinent negatives include no chills, discharge, flank pain, hematuria, hesitancy, nausea, possible pregnancy, sweats, urgency or vomiting. She has tried nothing for the symptoms. The treatment provided no relief.  Hypertension This is a chronic problem. The current episode started more than 1 year ago. The problem is uncontrolled. Pertinent negatives include no anxiety, blurred vision, chest  pain, headaches, malaise/fatigue, neck pain, orthopnea, palpitations, peripheral edema, PND, shortness of breath or sweats. Risk factors for coronary artery disease include post-menopausal state. Past treatments include angiotensin blockers, calcium channel blockers and direct vasodilators. Compliance problems: unsure of directions for as needed medications.     Relevant past medical, surgical, family, and social history reviewed and updated as indicated.  Allergies and medications reviewed and updated. Data reviewed: Chart in Epic.   Past Medical History:  Diagnosis Date   Asthma    BPPV (benign paroxysmal positional vertigo)    Claustrophobia    Diverticulosis    GERD (gastroesophageal reflux disease)    H/O degenerative disc disease    Hiatal hernia    Hyperlipidemia    Hypertension    Kidney stones    Migraines    Osteoarthritis    Caused feet deformity   Osteopenia    Prediabetes    Preglaucoma    Raynauds syndrome    Rectocele    Rosacea    Sciatica    Sleep apnea    Spinal stenosis    Vertebrobasilar artery syndrome     Past Surgical History:  Procedure Laterality Date   ABDOMINAL HYSTERECTOMY  1978   ANTERIOR (CYSTOCELE) AND POSTERIOR REPAIR (RECTOCELE) WITH XENFORM GRAFT AND SACROSPINOUS FIXATION     BILATERAL OOPHORECTOMY  1997   bladder stones     kidney colic/ procedure performed in 1973 at [facility] Left 12/1987   neg   BREAST BIOPSY Right 02/1977   neg papilloma   CARDIAC CATHETERIZATION  2000   HERNIA REPAIR     plantar fibroma  Left 09/1997   Excised    Social History   Socioeconomic History   Marital status: Single    Spouse name: Not on file   Number of children: Not on file   Years of education: Not on file   Highest education level: Not on file  Occupational History   Occupation: Retired    Comment: [employer], drilling  Tobacco Use   Smoking status: Never   Smokeless tobacco: Never  Vaping  Use   Vaping Use: Never used  Substance and Sexual Activity   Alcohol use: Yes    Alcohol/week: 2.0 standard drinks of alcohol    Types: 2 Glasses of wine per week    Comment: per week   Drug use: Never   Sexual activity: Not Currently    Partners: Male    Birth control/protection: Surgical  Other Topics Concern   Not on file  Social History Narrative   Right Handed   Lives in an apartment complex and her apartment is on ground level.    Drinks Half [employer] Coffee and Tea   Originally from [place], then lived in [place] most of her life - moved here 09/2019 - feels out of place, having trouble finding others with similar interests   She is agnostic - not religious (although raised Catholic) - strong scientific beliefs    Social Determinants of Health   Financial Resource Strain: Medium Risk (09/15/2020)   Overall Financial Resource Strain (CARDIA)    Difficulty of Paying Living Expenses: Somewhat hard  Food Insecurity: No Food Insecurity (09/15/2020)   Hunger Vital Sign    Worried About Running Out of Food in the Last Year: Never true    Ran Out of Food in the Last Year: Never true  Transportation Needs: No Transportation Needs (09/15/2020)   PRAPARE - Hydrologist (Medical): No    Lack of Transportation (Non-Medical): No  Physical Activity: Insufficiently Active (09/15/2020)   Exercise Vital Sign    Days of Exercise per Week: 7 days    Minutes of Exercise per Session: 20 min  Stress: No Stress Concern Present (09/15/2020)   [place]    Feeling of Stress : Not at all  Social Connections: Socially Isolated (09/15/2020)   Social Connection and Isolation Panel [NHANES]    Frequency of Communication with Friends and Family: More than three times a week    Frequency of Social Gatherings with Friends and Family: Once a week    Attends Religious Services: Never    Marine scientist or  Organizations: No    Attends Archivist Meetings: Never    Marital Status: Divorced  Human resources officer Violence: Not At Risk (09/15/2020)   Humiliation, Afraid, Rape, and Kick questionnaire    Fear of Current or Ex-Partner: No    Emotionally Abused: No    Physically Abused: No    Sexually Abused: No    Outpatient Encounter Medications as of 02/16/2022  Medication Sig   albuterol (VENTOLIN HFA) 108 (90 Base) MCG/ACT inhaler Inhale 2 puffs into the lungs every 6 (six) hours as needed for wheezing or shortness of breath.   Ascorbic Acid 500 MG CAPS Take by mouth.   Biotin 5000 MCG TABS Take 5,000 mcg by mouth daily.   calcium carbonate (TUMS - DOSED IN MG ELEMENTAL CALCIUM) 500 MG chewable tablet Chew 1  tablet by mouth as needed for indigestion or heartburn.   diltiazem (CARDIZEM) 30 MG tablet Take 1 tablet (30 mg total) by mouth 2 (two) times daily as needed (For a heart rate over 100 bpm).   losartan (COZAAR) 25 MG tablet Take 1 tablet (25 mg total) by mouth 2 (two) times daily.   losartan (COZAAR) 25 MG tablet Take 0.5 tablets (12.5 mg total) by mouth every morning. Take 1/2 tablet with a full 25 mg tablet every morning for a total of 37.5 mg every morning   mirabegron ER (MYRBETRIQ) 25 MG TB24 tablet Take 1 tablet (25 mg total) by mouth daily.   Multiple Vitamin (MULTI-VITAMIN) tablet Take by mouth.   Omega-3 1000 MG CAPS Take by mouth.   vitamin B-12 (CYANOCOBALAMIN) 100 MCG tablet Take 100 mcg by mouth daily.   hydrALAZINE (APRESOLINE) 10 MG tablet Take 1 tablet (10 mg total) by mouth as needed. Take (1) tablet as needed for blood pressure sustained of 170 or greater systolic (Patient not taking: Reported on 02/16/2022)   No facility-administered encounter medications on file as of 02/16/2022.    Allergies  Allergen Reactions   Calcitonin ([place]) Other (See Comments)    LEFT BUNDLE BRANCH BLOCK   Estradiol Other (See Comments)    Headache   Fexofenadine Other (See Comments)     Syncope   Miacalcin [Calcitonin] Palpitations   Nsaids Other (See Comments)    Stomach ulcer after taking for 3 days in 2009. Negative H. Pylori test.   Diphenhydramine Other (See Comments)    Syncope   Amoxicillin-Pot Clavulanate Rash    Able to take plain amoxicillin   Ciprofloxacin Rash   Clarithromycin Other (See Comments)    CAN TAKE AZITHROMYCIN   Codeine Other (See Comments)   Doxepin Hcl Other (See Comments)    Heart arrhythmia   Epinephrine Other (See Comments)   Erythromycin Other (See Comments)    Stomach irritation   Fluticasone Other (See Comments)   Fluticasone Propionate Other (See Comments)    And similar corticoid steroids   Ipratropium Bromide Other (See Comments)    Syncope   Latex Rash   Lisinopril Other (See Comments)    Other Reaction: BRUISING, CANKERS & ARTHRALGIAS   Nitrofurantoin Other (See Comments)    Multiple mild symptoms - would prefer not to take again   Other Other (See Comments)    Paraphenylendiamine/propanol amine/anti-cholinergic compounds   Sulfa Antibiotics Other (See Comments)   Tetracycline Other (See Comments)    Mouth sores after just 2 doses. DOXYCYCLINE is OK per patient    Review of Systems  Constitutional:  Negative for activity change, appetite change, chills, diaphoresis, fatigue, fever, malaise/fatigue and unexpected weight change.  HENT: Negative.    Eyes: Negative.  Negative for blurred vision, photophobia and visual disturbance.  Respiratory:  Negative for cough, chest tightness and shortness of breath.   Cardiovascular:  Negative for chest pain, palpitations, orthopnea, leg swelling and PND.  Gastrointestinal:  Negative for abdominal pain, blood in stool, constipation, diarrhea, nausea and vomiting.  Endocrine: Negative.   Genitourinary:  Positive for frequency. Negative for decreased urine volume, difficulty urinating, dysuria, flank pain, hematuria, hesitancy and urgency.  Musculoskeletal:  Negative for  arthralgias, myalgias and neck pain.  Skin: Negative.   Allergic/Immunologic: Negative.   Neurological:  Negative for dizziness, tremors, seizures, syncope, facial asymmetry, speech difficulty, weakness, light-headedness, numbness and headaches.  Hematological: Negative.   Psychiatric/Behavioral:  Negative for confusion, hallucinations, sleep disturbance and suicidal ideas.  All other systems reviewed and are negative.       Objective:  BP (!) 176/67   Pulse 68   Temp 98 F (36.7 C) (Temporal)   Ht 4\' 10"  (1.473 m)   Wt 126 lb 12.8 oz (57.5 kg)   SpO2 99%   BMI 26.50 kg/m    Wt Readings from Last 3 Encounters:  02/16/22 126 lb 12.8 oz (57.5 kg)  02/02/22 128 lb (58.1 kg)  01/27/22 125 lb (56.7 kg)    Physical Exam Vitals and nursing note reviewed.  Constitutional:      General: She is not in acute distress.    Appearance: Normal appearance. She is overweight. She is not ill-appearing, toxic-appearing or diaphoretic.  HENT:     Head: Normocephalic and atraumatic.     Nose: Nose normal.     Mouth/Throat:     Mouth: Mucous membranes are moist.  Eyes:     Conjunctiva/sclera: Conjunctivae normal.     Pupils: Pupils are equal, round, and reactive to light.  Cardiovascular:     Rate and Rhythm: Normal rate and regular rhythm.     Heart sounds: Murmur heard.     Systolic murmur is present with a grade of 2/6.  Pulmonary:     Effort: Pulmonary effort is normal.     Breath sounds: Normal breath sounds.  Musculoskeletal:     Right lower leg: No edema.     Left lower leg: No edema.  Skin:    General: Skin is warm and dry.     Capillary Refill: Capillary refill takes less than 2 seconds.  Neurological:     General: No focal deficit present.     Mental Status: She is alert and oriented to person, place, and time.  Psychiatric:        Mood and Affect: Mood normal.        Behavior: Behavior normal.        Thought Content: Thought content normal.        Judgment: Judgment  normal.     Results for orders placed or performed in visit on 02/16/22  Urinalysis, Routine w reflex microscopic  Result Value Ref Range   Specific Gravity, UA 1.015 1.005 - 1.030   pH, UA 7.0 5.0 - 7.5   Color, UA Yellow Yellow   Appearance Ur Clear Clear   Leukocytes,UA Negative Negative   Protein,UA Negative Negative/Trace   Glucose, UA Negative Negative   Ketones, UA Negative Negative   RBC, UA Negative Negative   Bilirubin, UA Negative Negative   Urobilinogen, Ur 0.2 0.2 - 1.0 mg/dL   Nitrite, UA Negative Negative       Pertinent labs & imaging results that were available during my care of the patient were reviewed by me and considered in my medical decision making.  Assessment & Plan:  Faithlynn was seen today for establish care, medical management of chronic issues and urinary frequency.  Diagnoses and all orders for this visit:  Frequent urination Urinalysis unremarkable in office. States she has not started the Myrbetriq which was prescribed in August. Would like to try as she has frequent nocturia. Aware to report new or worsening symptoms.  -     Urinalysis, Routine w reflex microscopic -     mirabegron ER (MYRBETRIQ) 25 MG TB24 tablet; Take 1 tablet (25 mg total) by mouth daily.  PAF (paroxysmal atrial fibrillation) ([place]) Has been evaluated by Dr. Percival Spanish and has echo scheduled. Was sent a ZIO patch.  Pt states she declined delivery as she was not aware of need for device. Placed 2 week event monitor in office today and will copy results to cardiology. Pt aware of importance of wearing device to determine if arrhythmia is present.  -     LONG TERM MONITOR (3-14 DAYS); Future  Asthma, mild intermittent, well-controlled Asymptomatic but inhaler has expired, will renew today.  -     albuterol (VENTOLIN HFA) 108 (90 Base) MCG/ACT inhaler; Inhale 2 puffs into the lungs every 6 (six) hours as needed for wheezing or shortness of breath.  Hypertension   Has not been  taking as needed medications for HR and BP control. States she was unaware of the need for these medications. Went over medication instructions with pt in detail today and highlighted them on her AVS for reference.   Continue all other maintenance medications.  Follow up plan: Return in about 3 months (around 05/19/2022), or if symptoms worsen or fail to improve, for nocturia, HTN.   Continue healthy lifestyle choices, including diet (rich in fruits, vegetables, and lean proteins, and low in salt and simple carbohydrates) and exercise (at least 30 minutes of moderate physical activity daily).  Educational handout given for A-Fib  The above assessment and management plan was discussed with the patient. The patient verbalized understanding of and has agreed to the management plan. Patient is aware to call the clinic if they develop any new symptoms or if symptoms persist or worsen. Patient is aware when to return to the clinic for a follow-up visit. Patient educated on when it is appropriate to go to the emergency department.   Monia Pouch, FNP-C [facility] [phone]

## 2022-02-17 ENCOUNTER — Telehealth: Payer: Self-pay | Admitting: Family Medicine

## 2022-02-17 ENCOUNTER — Other Ambulatory Visit: Payer: Self-pay | Admitting: Family Medicine

## 2022-02-17 DIAGNOSIS — J4520 Mild intermittent asthma, uncomplicated: Secondary | ICD-10-CM

## 2022-02-17 MED ORDER — ALBUTEROL SULFATE HFA 108 (90 BASE) MCG/ACT IN AERS: 2.0000 | INHALATION_SPRAY | Freq: Four times a day (QID) | RESPIRATORY_TRACT | 2 refills | Status: DC | PRN

## 2022-02-17 NOTE — Telephone Encounter (Signed)
Patient wants an inhaler called in to [facility]. She was seen on 11/7 but said she did not have time to mention it during the appt and does not want to come back in about it. Please call back and advise.

## 2022-02-17 NOTE — Telephone Encounter (Signed)
Patient notified and verbalized understanding. 

## 2022-02-18 ENCOUNTER — Telehealth: Payer: Self-pay | Admitting: Cardiology

## 2022-02-18 MED ORDER — HYDRALAZINE HCL 10 MG PO TABS: 10.0000 mg | ORAL_TABLET | ORAL | 3 refills | Status: DC | PRN

## 2022-02-18 NOTE — Telephone Encounter (Signed)
Spoke with pt, questions regarding medications and blood pressure answered. She reports that her blood pressure is still running high 148/60/70, 153/61/69, 167/66/63 and today 169. She is taking 25 and 12.5 tablet in the morning and 25 mg of losartan in the evening. Aware will forward to dr hochrein to review.

## 2022-02-18 NOTE — Telephone Encounter (Signed)
Pt c/o medication issue:  1. Name of Medication: losartan (COZAAR) 25 MG tablet   2. How are you currently taking this medication (dosage and times per day)? Take 1 tablet (25 mg total) by mouth 2 (two) times daily   3. Are you having a reaction (difficulty breathing--STAT)? no  4. What is your medication issue? Calling to see if the medication should be increase. Patient states that is not keeping her bp down. It still is high. Please advise

## 2022-02-19 NOTE — Telephone Encounter (Signed)
Spoke with pt, aware of dr hochrein's recommendations. ?

## 2022-02-22 ENCOUNTER — Encounter: Payer: Self-pay | Admitting: *Deleted

## 2022-02-22 ENCOUNTER — Other Ambulatory Visit (HOSPITAL_COMMUNITY): Payer: Commercial Managed Care - PPO

## 2022-02-22 ENCOUNTER — Ambulatory Visit: Payer: Commercial Managed Care - PPO

## 2022-02-22 DIAGNOSIS — R002 Palpitations: Secondary | ICD-10-CM

## 2022-02-22 DIAGNOSIS — I480 Paroxysmal atrial fibrillation: Secondary | ICD-10-CM

## 2022-02-22 NOTE — Progress Notes (Unsigned)
Patient ID: Heidi Dorsey, female   DOB: 1930/04/21, 88 y.o.   MRN: [id number] Patient had an order for a ZIO XT monitor 02/02/22, and refused delivery of monitor.  When she scheduled her echocardiogram it was asked if we could apply the monitor at the same appointment.  A new order was entered and an appointment was made to apply her monitor 02/22/22 after her echocardiogram.  Appointment for echocardiogram was rescheduled to 03/16/22.  It was noted that a new order for a ZIO XT monitor was placed by [facility] on 02/16/22 and applied in their office. Since this created a duplicate order, I will cancel the order requested by Dr. Antoine Poche on 02/02/22.

## 2022-03-02 ENCOUNTER — Telehealth: Payer: Self-pay | Admitting: Family Medicine

## 2022-03-02 NOTE — Telephone Encounter (Signed)
Attempted to contact patient - NA  I placed Zio and patient was given the box to mail back the zio in and was explained how. She was instructed to keep box and mail it back in box.  Instructions were also explained in the booklet she was given.  When patient returns call please let her know

## 2022-03-02 NOTE — Telephone Encounter (Signed)
Pt says that she has a z patch on her chest and its been 2 weeks. She is asking what to do with it. She says it was put on at last ov. Please call back.

## 2022-03-02 NOTE — Telephone Encounter (Signed)
Patient states she was not told how any this works she states she figured it out on her on. But we need to explain things when we put this on a patient. Patient will mail back on her on

## 2022-03-11 ENCOUNTER — Encounter (HOSPITAL_COMMUNITY): Payer: Self-pay

## 2022-03-11 ENCOUNTER — Emergency Department (HOSPITAL_COMMUNITY)
Admission: EM | Admit: 2022-03-11 | Discharge: 2022-03-11 | Disposition: A | Discharge status: Home / Self Care | Payer: Medicare Other | Attending: Emergency Medicine | Admitting: Emergency Medicine

## 2022-03-11 ENCOUNTER — Emergency Department (HOSPITAL_COMMUNITY): Payer: Medicare Other

## 2022-03-11 ENCOUNTER — Other Ambulatory Visit: Payer: Self-pay

## 2022-03-11 DIAGNOSIS — Z79899 Other long term (current) drug therapy: Secondary | ICD-10-CM | POA: Diagnosis not present

## 2022-03-11 DIAGNOSIS — R42 Dizziness and giddiness: Secondary | ICD-10-CM | POA: Diagnosis not present

## 2022-03-11 DIAGNOSIS — I10 Essential (primary) hypertension: Secondary | ICD-10-CM | POA: Insufficient documentation

## 2022-03-11 DIAGNOSIS — Z91040 Latex allergy status: Secondary | ICD-10-CM | POA: Insufficient documentation

## 2022-03-11 LAB — COMPREHENSIVE METABOLIC PANEL
ALT: 17 U/L (ref 0–44)
AST: 19 U/L (ref 15–41)
Albumin: 3.8 g/dL (ref 3.5–5.0)
Alkaline Phosphatase: 56 U/L (ref 38–126)
Anion gap: 6 (ref 5–15)
BUN: 18 mg/dL (ref 8–23)
CO2: 26 mmol/L (ref 22–32)
Calcium: 8.7 mg/dL — ABNORMAL LOW (ref 8.9–10.3)
Chloride: 106 mmol/L (ref 98–111)
Creatinine, Ser: 0.69 mg/dL (ref 0.44–1.00)
GFR, Estimated: >60 mL/min (ref >60)
Glucose, Bld: 107 mg/dL — ABNORMAL HIGH (ref 70–99)
Potassium: 3.7 mmol/L (ref 3.5–5.1)
Sodium: 138 mmol/L (ref 135–145)
Total Bilirubin: 0.8 mg/dL (ref 0.3–1.2)
Total Protein: 6.3 g/dL — ABNORMAL LOW (ref 6.5–8.1)

## 2022-03-11 LAB — CBC WITH DIFFERENTIAL/PLATELET
Abs Immature Granulocytes: 0.03 10*3/uL (ref 0.00–0.07)
Basophils Absolute: 0 10*3/uL (ref 0.0–0.1)
Basophils Relative: 1 %
Eosinophils Absolute: 0.1 10*3/uL (ref 0.0–0.5)
Eosinophils Relative: 1 %
HCT: 38.1 % (ref 36.0–46.0)
Hemoglobin: 12.4 g/dL (ref 12.0–15.0)
Immature Granulocytes: 1 %
Lymphocytes Relative: 21 %
Lymphs Abs: 1 10*3/uL (ref 0.7–4.0)
MCH: 29.7 pg (ref 26.0–34.0)
MCHC: 32.5 g/dL (ref 30.0–36.0)
MCV: 91.4 fL (ref 80.0–100.0)
Monocytes Absolute: 0.4 10*3/uL (ref 0.1–1.0)
Monocytes Relative: 9 %
Neutro Abs: 3.1 10*3/uL (ref 1.7–7.7)
Neutrophils Relative %: 67 %
Platelets: 160 10*3/uL (ref 150–400)
RBC: 4.17 MIL/uL (ref 3.87–5.11)
RDW: 15.4 % (ref 11.5–15.5)
WBC: 4.6 10*3/uL (ref 4.0–10.5)
nRBC: 0 % (ref 0.0–0.2)

## 2022-03-11 LAB — TROPONIN I (HIGH SENSITIVITY)
Troponin I (High Sensitivity): 7 ng/L (ref <18)
Troponin I (High Sensitivity): 7 ng/L (ref <18)

## 2022-03-11 MED ORDER — SODIUM CHLORIDE 0.9 % IV BOLUS
500.0000 mL | Freq: Once | INTRAVENOUS | Status: AC
  Administered 2022-03-11: 500 mL via INTRAVENOUS

## 2022-03-11 NOTE — Discharge Instructions (Signed)
Follow-up with your cardiologist after you get your echo done

## 2022-03-11 NOTE — ED Triage Notes (Signed)
Patient states hx of hypertension. Patient states she was awoken this morning with dizziness. Patient states she is supposed to take hydralazine PRN when her pressure is high but has not taken it this morning. Patient did not take her normal 37.5mg  losartan this morning. Patient states continued dizziness.

## 2022-03-11 NOTE — ED Notes (Signed)
Gave pt water 

## 2022-03-13 NOTE — ED Provider Notes (Signed)
[facility] EMERGENCY DEPARTMENT Provider Note   CSN: [id number] Arrival date & time: 03/11/22  0751     History  Chief Complaint  Patient presents with   Hypertension    Heidi Dorsey is a 88 y.o. female.  Patient has history of hypertension.  She complained of dizziness today.  She takes her blood pressure medicine as needed depending on what her blood pressure is.  The history is provided by the patient and medical records. No language interpreter was used.  Hypertension This is a recurrent problem. The problem occurs constantly. The problem has not changed since onset.Pertinent negatives include no chest pain, no abdominal pain and no headaches. Nothing aggravates the symptoms. Nothing relieves the symptoms. She has tried nothing for the symptoms.       Home Medications Prior to Admission medications   Medication Sig Start Date End Date Taking? Authorizing Provider  albuterol (VENTOLIN HFA) 108 (90 Base) MCG/ACT inhaler Inhale 2 puffs into the lungs every 6 (six) hours as needed for wheezing or shortness of breath. 02/17/22  Yes Rakes, Doralee Albino, FNP  Ascorbic Acid 500 MG CAPS Take 500 mg by mouth daily.   Yes [provider]  Biotin 5000 MCG TABS Take 5,000 mcg by mouth daily.   Yes [provider]  calcium carbonate (TUMS - DOSED IN MG ELEMENTAL CALCIUM) 500 MG chewable tablet Chew 1 tablet by mouth as needed for indigestion or heartburn.   Yes [provider]  diltiazem (CARDIZEM) 30 MG tablet Take 1 tablet (30 mg total) by mouth 2 (two) times daily as needed (For a heart rate over 100 bpm). 02/02/22  Yes Rollene Rotunda, MD  hydrALAZINE (APRESOLINE) 10 MG tablet Take 1 tablet (10 mg total) by mouth as needed. Take (1) tablet as needed for blood pressure sustained of 170 or greater systolic 02/18/22  Yes Hochrein, Fayrene Fearing, MD  losartan (COZAAR) 25 MG tablet Take 1 tablet (25 mg total) by mouth 2 (two) times daily. 10/26/21  Yes Rollene Rotunda, MD   losartan (COZAAR) 25 MG tablet Take 0.5 tablets (12.5 mg total) by mouth every morning. Take 1/2 tablet with a full 25 mg tablet every morning for a total of 37.5 mg every morning 01/13/22  Yes Hochrein, Fayrene Fearing, MD  mirabegron ER (MYRBETRIQ) 25 MG TB24 tablet Take 1 tablet (25 mg total) by mouth daily. 02/16/22  Yes Rakes, Doralee Albino, FNP  Multiple Vitamin (MULTI-VITAMIN) tablet Take 1 tablet by mouth daily.   Yes [provider]  vitamin B-12 (CYANOCOBALAMIN) 100 MCG tablet Take 100 mcg by mouth daily.   Yes [provider]      Allergies    Calcitonin (salmon), Estradiol, Fexofenadine, Miacalcin [calcitonin], Nsaids, Diphenhydramine, Amoxicillin-pot clavulanate, Ciprofloxacin, Clarithromycin, Codeine, Doxepin hcl, Epinephrine, Erythromycin, Fluticasone, Fluticasone propionate, Ipratropium bromide, Latex, Lisinopril, Nitrofurantoin, Other, Sulfa antibiotics, and Tetracycline    Review of Systems   Review of Systems  Constitutional:  Negative for appetite change and fatigue.  HENT:  Negative for congestion, ear discharge and sinus pressure.   Eyes:  Negative for discharge.  Respiratory:  Negative for cough.   Cardiovascular:  Negative for chest pain.  Gastrointestinal:  Negative for abdominal pain and diarrhea.  Genitourinary:  Negative for frequency and hematuria.  Musculoskeletal:  Negative for back pain.  Skin:  Negative for rash.  Neurological:  Positive for dizziness. Negative for seizures and headaches.  Psychiatric/Behavioral:  Negative for hallucinations.     Physical Exam Updated Vital Signs BP (!) 157/66  Pulse (!) 58   Temp 98.1 F (36.7 C) (Oral)   Resp 17   Ht 4\' 11"  (1.499 m)   Wt 59 kg   SpO2 96%   BMI 26.26 kg/m  Physical Exam Vitals and nursing note reviewed.  Constitutional:      Appearance: She is well-developed.  HENT:     Head: Normocephalic.     Nose: Nose normal.  Eyes:     General: No scleral icterus.    Conjunctiva/sclera:  Conjunctivae normal.  Neck:     Thyroid: No thyromegaly.  Cardiovascular:     Rate and Rhythm: Normal rate and regular rhythm.     Heart sounds: No murmur heard.    No friction rub. No gallop.  Pulmonary:     Breath sounds: No stridor. No wheezing or rales.  Chest:     Chest wall: No tenderness.  Abdominal:     General: There is no distension.     Tenderness: There is no abdominal tenderness. There is no rebound.  Musculoskeletal:        General: Normal range of motion.     Cervical back: Neck supple.  Lymphadenopathy:     Cervical: No cervical adenopathy.  Skin:    Findings: No erythema or rash.  Neurological:     Mental Status: She is alert and oriented to person, place, and time.     Motor: No abnormal muscle tone.     Coordination: Coordination normal.  Psychiatric:        Behavior: Behavior normal.     ED Results / Procedures / Treatments   Labs (all labs ordered are listed, but only abnormal results are displayed) Labs Reviewed  COMPREHENSIVE METABOLIC PANEL - Abnormal; Notable for the following components:      Result Value   Glucose, Bld 107 (*)    Calcium 8.7 (*)    Total Protein 6.3 (*)    All other components within normal limits  CBC WITH DIFFERENTIAL/PLATELET  TROPONIN I (HIGH SENSITIVITY)  TROPONIN I (HIGH SENSITIVITY)    EKG EKG Interpretation  Date/Time:  Thursday March 11 2022 08:06:24 EST Ventricular Rate:  60 PR Interval:  168 QRS Duration: 146 QT Interval:  451 QTC Calculation: 451 R Axis:   47 Text Interpretation: Sinus rhythm IVCD, consider atypical LBBB Baseline wander in lead(s) V5 Since last tracing rate slower Otherwise no significant change Confirmed by Deno Etienne [phone]) on 03/12/2022 9:11:35 AM  Radiology LONG TERM MONITOR (3-14 DAYS)  Result Date: 03/12/2022 Will forward to cardiologist. Concerns for tachy-brady syndrome and need for EP evaluation. Patch Wear Time:  13 days and 23 hours (2023-11-07T11:13:37-0500 to  2023-11-21T11:13:29-0500) Patient had a min HR of 46 bpm, max HR of 200 bpm, and avg HR of 68 bpm. Predominant underlying rhythm was Sinus Rhythm. Bundle Branch Block/IVCD was present. 29 Supraventricular Tachycardia runs occurred, the run with the fastest interval lasting 7 beats with a max rate of 200 bpm, the longest lasting 18 beats with an avg rate of 95 bpm. Isolated SVEs were rare (<1.0%), SVE Couplets were rare (<1.0%), and SVE Triplets were rare (<1.0%). Isolated VEs were occasional (1.8%, 25015), VE Couplets were rare (<1.0%, 474), and no VE Triplets were present. Ventricular Bigeminy and Trigeminy were present.    Procedures Procedures    Medications Ordered in ED Medications  sodium chloride 0.9 % bolus 500 mL (0 mLs Intravenous Stopped 03/11/22 [id number])    ED Course/ Medical Decision Making/ A&P  Medical Decision Making Amount and/or Complexity of Data Reviewed Labs: ordered. Radiology: ordered.  This patient presents to the ED for concern of dizziness and high blood pressure, this involves an extensive number of treatment options, and is a complaint that carries with it a high risk of complications and morbidity.  The differential diagnosis includes stroke, poorly controlled blood pressure   Co morbidities that complicate the patient evaluation  Hypertension   Additional history obtained:  Additional history obtained from patient External records from outside source obtained and reviewed including hospital records   Lab Tests:  I Ordered, and personally interpreted labs.  The pertinent results include: CBC and chemistries unremarkable   Imaging Studies ordered:  I ordered imaging studies including chest x-ray I independently visualized and interpreted imaging which showed negative I agree with the radiologist interpretation   Cardiac Monitoring: / EKG:  The patient was maintained on a cardiac monitor.  I personally viewed and  interpreted the cardiac monitored which showed an underlying rhythm of: Sinus rhythm   Consultations Obtained:  No consultant  Problem List / ED Course / Critical interventions / Medication management  Dizziness and hypertension I ordered medication including normal saline for dehydration Reevaluation of the patient after these medicines showed that the patient improved I have reviewed the patients home medicines and have made adjustments as needed   Social Determinants of Health:  None   Test / Admission - Considered:  No additional test necessary  Patient with hypertension and dizziness that has resolved.  I instructed her to take her blood pressure medicine as prescribed by her doctor and record her blood pressure once a day and follow-up with her doctor this week        Final Clinical Impression(s) / ED Diagnoses Final diagnoses:  Dizziness  Hypertension, unspecified type    Rx / DC Orders ED Discharge Orders     None         Milton Ferguson, MD 03/13/22 [phone]

## 2022-03-16 ENCOUNTER — Other Ambulatory Visit (HOSPITAL_COMMUNITY): Payer: Commercial Managed Care - PPO

## 2022-03-16 ENCOUNTER — Telehealth (HOSPITAL_COMMUNITY): Payer: Self-pay | Admitting: Cardiology

## 2022-03-16 NOTE — Telephone Encounter (Signed)
Patient called and cancelled echocardiogram due to she is claustrophobic and does not want the Echocardiogram. Order will be removed from [facility].

## 2022-03-30 NOTE — Progress Notes (Unsigned)
Cardiology Office Note   Date:  03/31/2022   ID:  Heidi, Dorsey March 10, 1931, MRN [id number]  PCP:  Baruch Gouty, FNP  Cardiologist:   None Referring:  Baruch Gouty, FNP  Chief Complaint  Patient presents with   Foot Pain     History of Present Illness: Heidi Dorsey is a 88 y.o. female who presents for evaluation of palpitations.    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  She had a long history of left bundle branch block.  She has had a cardiac catheterization in 2000.  She does not remember the results but was not told that she had vascular disease.  I see that she has had carotid Dopplers which were unremarkable.  She had a very mildly reduced ejection fraction on the stress echo in 2017 with her EF being 50%.  She has had a 24-hour Holter.  She previously has been treated with metoprolol.  Most recently she was on Cozaar 12.5 mg daily but this was discontinued because her blood pressures were running low.   She also called back and she was had an eye bleed on ARB so she did not want to start this.  She has also been intolerant of amlodipine.  She cannot take ACE inhibitors.  She seemed to be tolerating beta blocker so at the last visit I was prescribing this.  I had suggested hydralazine PRN SBP greater than 160.  She called back because she did not want to take the hydralazine because she read it would worsen her asthma.    When we spoke to her in June she was taking Losartan in the AM and metoprolol in the evening.  She was back to taking hydralazine but because a pharmacist told her it did not make sense to take it PRN she was taking it daily.  She did not want to be seen in the HTN clinic.  I tried to schedule her to see me in [place] as there were no appts in [place] and she would not do this.  Her blood pressure has been labile.   She was found to have PAF recently.  Since I last saw her she was in the ED with dizziness.  I reviewed these  records for this visit.    She was hydrated but there were no other acute abnormalities noted.   Since being in the emergency room she does not report any new dizziness, presyncope or syncope.  Her daughter brought her today. 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  This has been going on for few days.  She is not having any new shortness of breath, PND or orthopnea.  She is not having anything in the way of palpitations, presyncope or syncope.  She denies chest pain.  She says her blood pressure has been reasonably well-controlled.  She did wear a monitor that was ordered by her primary provider and I reviewed this as well as the ER notes.  She had no documented atrial fibrillation but she did have some atrial tachycardia and runs of SVT.  Predominant rhythm was sinus.  It was sinus bradycardia.  Past Medical History:  Diagnosis Date   Asthma    BPPV (benign paroxysmal positional vertigo)    Claustrophobia    Diverticulosis    GERD (gastroesophageal reflux disease)    H/O degenerative disc disease    Hiatal hernia  Hyperlipidemia    Hypertension    Kidney stones    Migraines    Osteoarthritis    Caused feet deformity   Osteopenia    Prediabetes    Preglaucoma    Raynauds syndrome    Rectocele    Rosacea    Sciatica    Sleep apnea    Spinal stenosis    Vertebrobasilar artery syndrome     Past Surgical History:  Procedure Laterality Date   ABDOMINAL HYSTERECTOMY  1978   ANTERIOR (CYSTOCELE) AND POSTERIOR REPAIR (RECTOCELE) WITH XENFORM GRAFT AND SACROSPINOUS FIXATION     BILATERAL OOPHORECTOMY  1997   bladder stones     kidney colic/ procedure performed in 1973 at [facility]   BLADDER SUSPENSION     BREAST BIOPSY Left 12/1987   neg   BREAST BIOPSY Right 02/1977   neg papilloma   CARDIAC CATHETERIZATION  2000   HERNIA REPAIR     plantar fibroma  Left 09/1997   Excised     Current Outpatient Medications  Medication Sig Dispense Refill    Ascorbic Acid 500 MG CAPS Take 500 mg by mouth daily.     Biotin 5000 MCG TABS Take 5,000 mcg by mouth daily.     calcium carbonate (TUMS - DOSED IN MG ELEMENTAL CALCIUM) 500 MG chewable tablet Chew 1 tablet by mouth as needed for indigestion or heartburn.     hydrALAZINE (APRESOLINE) 10 MG tablet Take 1 tablet (10 mg total) by mouth as needed. Take (1) tablet as needed for blood pressure sustained of 170 or greater systolic 30 tablet 3   losartan (COZAAR) 25 MG tablet Take 1 tablet (25 mg total) by mouth 2 (two) times daily. 180 tablet 3   losartan (COZAAR) 25 MG tablet Take 0.5 tablets (12.5 mg total) by mouth every morning. Take 1/2 tablet with a full 25 mg tablet every morning for a total of 37.5 mg every morning 45 tablet 3   Multiple Vitamin (MULTI-VITAMIN) tablet Take 1 tablet by mouth daily.     albuterol (VENTOLIN HFA) 108 (90 Base) MCG/ACT inhaler Inhale 2 puffs into the lungs every 6 (six) hours as needed for wheezing or shortness of breath. (Patient not taking: Reported on 03/31/2022) 8 g 2   diltiazem (CARDIZEM) 30 MG tablet Take 1 tablet (30 mg total) by mouth 2 (two) times daily as needed (For a heart rate over 100 bpm). (Patient not taking: Reported on 03/31/2022) 30 tablet 2   mirabegron ER (MYRBETRIQ) 25 MG TB24 tablet Take 1 tablet (25 mg total) by mouth daily. (Patient not taking: Reported on 03/31/2022) 30 tablet 3   vitamin B-12 (CYANOCOBALAMIN) 100 MCG tablet Take 100 mcg by mouth daily. (Patient not taking: Reported on 03/31/2022)     No current facility-administered medications for this visit.    Allergies:   Calcitonin (salmon), Estradiol, Fexofenadine, Miacalcin [calcitonin], Nsaids, Diphenhydramine, Amoxicillin-pot clavulanate, Ciprofloxacin, Clarithromycin, Codeine, Doxepin hcl, Epinephrine, Erythromycin, Fluticasone, Fluticasone propionate, Ipratropium bromide, Latex, Lisinopril, Nitrofurantoin, Other, Sulfa antibiotics, and Tetracycline    ROS:  Please see the  history of present illness.   Otherwise, review of systems are positive for none.   All other systems are reviewed and negative.    PHYSICAL EXAM: VS:  BP (!) 162/78   Pulse (!) 54   Ht 4\' 11"  (1.499 m)   Wt 138 lb (62.6 kg)   SpO2 99%   BMI 27.87 kg/m  , BMI Body mass index is 27.87 kg/m. GEN:  No distress  NECK:  No jugular venous distention at 90 degrees, waveform within normal limits, carotid upstroke brisk and symmetric, no bruits, no thyromegaly LYMPHATICS:  No cervical adenopathy LUNGS:  Clear to auscultation bilaterally BACK:  No CVA tenderness CHEST:  Unremarkable HEART:  S1 and S2 within normal limits, no S3, no S4, no clicks, no rubs, 2 out of 6 apical systolic murmur radiating slightly at the axilla, no diastolic murmurs ABD:  Positive bowel sounds normal in frequency in pitch, no bruits, no rebound, no guarding, unable to assess midline mass or bruit with the patient seated. EXT:  2 plus pulses throughout, mild left foot edema, no cyanosis no clubbing SKIN:  No rashes no nodules NEURO:  Cranial nerves II through XII grossly intact, motor grossly intact throughout PSYCH:  Cognitively intact, oriented to person place and time   EKG:  EKG is not ordered today.   Recent Labs: 02/02/2022: TSH 1.990 03/11/2022: ALT 17; BUN 18; Creatinine, Ser 0.69; Hemoglobin 12.4; Platelets 160; Potassium 3.7; Sodium 138    Lipid Panel    Component Value Date/Time   CHOL 200 (H) 06/30/2021 1441   TRIG 47 06/30/2021 1441   HDL 91 06/30/2021 1441   CHOLHDL 2.2 06/30/2021 1441   LDLCALC 100 (H) 06/30/2021 1441      Wt Readings from Last 3 Encounters:  03/31/22 138 lb (62.6 kg)  03/11/22 130 lb (59 kg)  02/16/22 126 lb 12.8 oz (57.5 kg)      Other studies Reviewed: Additional studies/ records that were reviewed today include: ED records, monitor result Review of the above records demonstrates: See elsewhere   ASSESSMENT AND PLAN:  ATRIAL FIB:   She appears to have atrial  fibrillation in the emergency room 1 time but none on her monitor.  She is very sensitive to medications.  She would be high risk for falls.  We have had a discussion about anticoagulation and she would prefer not to and I think this is reasonable.  If she has any recurrence in the future she might consider Watchman but I doubt it.   HTN:     The blood pressure is mildly elevated but she prefers not to take her dose of medications.  She is been very sensitive.  She does have as needed hydralazine and we talked again today about how to use this.   MURMUR:   This was ordered but she did not have this.  She canceled the echo I had ordered.  She said she could get transportation although daughter says that is not true.  At this point I think we can follow this clinically.  FREQUENT URINATION:    She said that this is the urge to urinate without significant urine and production.  I will try to get her to see urology but she again does not want to go because of transportation.   LEG SWELLING: I will rule out DVT with left lower extremity venous Doppler   current medicines are reviewed at length with the patient today.  The patient does not have concerns regarding medicines.  The following changes have been made:   None  Labs/ tests ordered today include:       Orders Placed This Encounter  Procedures   US Venous Img Lower Unilateral Left (DVT)   VAS [place] LOWER EXTREMITY VENOUS (DVT)     Disposition:   FU with me in six months.    Signed, Minus Breeding, MD  03/31/2022 1:04 PM    [facility]

## 2022-03-31 ENCOUNTER — Ambulatory Visit (INDEPENDENT_AMBULATORY_CARE_PROVIDER_SITE_OTHER): Payer: Medicare Other | Admitting: Cardiology

## 2022-03-31 ENCOUNTER — Ambulatory Visit: Payer: Commercial Managed Care - PPO | Admitting: Cardiology

## 2022-03-31 ENCOUNTER — Ambulatory Visit (HOSPITAL_COMMUNITY)
Admission: RE | Admit: 2022-03-31 | Discharge: 2022-03-31 | Disposition: A | Discharge status: Home / Self Care | Payer: Medicare Other | Source: Ambulatory Visit | Attending: Cardiology | Admitting: Cardiology

## 2022-03-31 ENCOUNTER — Encounter: Payer: Self-pay | Admitting: Cardiology

## 2022-03-31 VITALS — BP 162/78 | HR 54 | Ht 59.0 in | Wt 138.0 lb

## 2022-03-31 DIAGNOSIS — I10 Essential (primary) hypertension: Secondary | ICD-10-CM | POA: Diagnosis not present

## 2022-03-31 DIAGNOSIS — I480 Paroxysmal atrial fibrillation: Secondary | ICD-10-CM

## 2022-03-31 DIAGNOSIS — M79605 Pain in left leg: Secondary | ICD-10-CM | POA: Diagnosis not present

## 2022-03-31 NOTE — Patient Instructions (Addendum)
Medication Instructions:  The current medical regimen is effective;  continue present plan and medications.  *If you need a refill on your cardiac medications before your next appointment, please call your pharmacy*  Testing/Procedures: Your physician has requested that you have a lower extremity venous duplex. This test is an ultrasound of the veins in the legs or arms. It looks at venous blood flow that carries blood from the heart to the legs or arms. Allow one hour for a Lower Venous exam. There are no restrictions or special instructions.  Follow-Up: At [facility], you and your health needs are our priority.  As part of our continuing mission to provide you with exceptional heart care, we have created designated Provider Care Teams.  These Care Teams include your primary Cardiologist (physician) and Advanced Practice Providers (APPs -  Physician Assistants and Nurse Practitioners) who all work together to provide you with the care you need, when you need it.  We recommend signing up for the patient portal called "MyChart".  Sign up information is provided on this After Visit Summary.  MyChart is used to connect with patients for Virtual Visits (Telemedicine).  Patients are able to view lab/test results, encounter notes, upcoming appointments, etc.  Non-urgent messages can be sent to your provider as well.   To learn more about what you can do with MyChart, go to [email or web address].    Your next appointment:   6 month(s)  The format for your next appointment:   In Person  Provider:   Rollene Rotunda, MD     Important Information About Sugar

## 2022-04-13 ENCOUNTER — Telehealth: Payer: Self-pay

## 2022-04-13 NOTE — Telephone Encounter (Signed)
Spoke with patient to review [place] results. Patient states she continues to have symptoms of lower left leg swelling. She will follow up with her primary provider.  Patient verbalized understanding and had no questions.

## 2022-04-22 ENCOUNTER — Telehealth: Payer: Self-pay | Admitting: Physician Assistant

## 2022-04-22 NOTE — Telephone Encounter (Addendum)
Returned call this morning for elevated BP. She states her BP  was 172/72  She took 37.5 mg losartan 7:38 am. At 8:06 AM I asked her to recheck her BP which was now 166/72.  I advised giving losartan another hour to work. I will reach back out to her at [place] and we will discuss next steps for PRN hydralazine.   We shared stories regarding our neuroscience training.   ADDENDUM: After several calls following some house chores and showering, I was able to connect with her and recheck of BP was 133/72 at 10:05am. I advised no further medications. She requested to check her BP once more while on the phone which was 130/60.   No further recommendations. She reports redness and swelling in her foot and questions gout. I relayed that its difficult to determine gout over the phone and suggested a visit with her PCP.

## 2022-05-10 ENCOUNTER — Ambulatory Visit: Payer: Medicare Other | Admitting: Family Medicine

## 2022-05-20 ENCOUNTER — Ambulatory Visit (INDEPENDENT_AMBULATORY_CARE_PROVIDER_SITE_OTHER): Payer: Medicare Other | Admitting: Family Medicine

## 2022-05-20 ENCOUNTER — Encounter: Payer: Self-pay | Admitting: Family Medicine

## 2022-05-20 ENCOUNTER — Ambulatory Visit (INDEPENDENT_AMBULATORY_CARE_PROVIDER_SITE_OTHER): Payer: Medicare Other

## 2022-05-20 VITALS — BP 170/60 | HR 72 | Temp 97.0°F | Ht 59.0 in | Wt 131.2 lb

## 2022-05-20 DIAGNOSIS — R351 Nocturia: Secondary | ICD-10-CM

## 2022-05-20 DIAGNOSIS — I129 Hypertensive chronic kidney disease with stage 1 through stage 4 chronic kidney disease, or unspecified chronic kidney disease: Secondary | ICD-10-CM

## 2022-05-20 DIAGNOSIS — I10 Essential (primary) hypertension: Secondary | ICD-10-CM

## 2022-05-20 DIAGNOSIS — M79672 Pain in left foot: Secondary | ICD-10-CM | POA: Diagnosis not present

## 2022-05-20 DIAGNOSIS — N3000 Acute cystitis without hematuria: Secondary | ICD-10-CM

## 2022-05-20 DIAGNOSIS — N1831 Chronic kidney disease, stage 3a: Secondary | ICD-10-CM

## 2022-05-20 LAB — URINALYSIS
Bilirubin, UA: NEGATIVE
Glucose, UA: NEGATIVE
Ketones, UA: NEGATIVE
Leukocytes,UA: NEGATIVE
Nitrite, UA: NEGATIVE
Protein,UA: NEGATIVE
RBC, UA: NEGATIVE
Specific Gravity, UA: 1.02 (ref 1.005–1.030)
Urobilinogen, Ur: 0.2 mg/dL (ref 0.2–1.0)
pH, UA: 6.5 (ref 5.0–7.5)

## 2022-05-20 LAB — CBC WITH DIFFERENTIAL/PLATELET

## 2022-05-20 MED ORDER — LOSARTAN POTASSIUM 25 MG PO TABS: 25.0000 mg | ORAL_TABLET | Freq: Two times a day (BID) | ORAL | 3 refills | Status: DC

## 2022-05-20 NOTE — Progress Notes (Signed)
Subjective:  Patient ID: Heidi Dorsey, female    DOB: 1931/01/12, 89 y.o.   MRN: [id number]  Patient Care Team: Baruch Gouty, FNP as PCP - General (Family Medicine) Alda Berthold, DO as Consulting Physician (Neurology)   Chief Complaint:  Hypertension and Urinary Frequency (3 month follow up. Patient states it only happens at night when she goes to bed and she has to get up every hour to pee. )   HPI: Heidi Dorsey is a 89 y.o. female presenting on 05/20/2022 for Hypertension and Urinary Frequency (3 month follow up. Patient states it only happens at night when she goes to bed and she has to get up every hour to pee. )   1. Essential hypertension Has not been taking medications as prescribed. She does take the losartan but does not take the as needed cardizem or hydralazine. She is followed by cardiology who recently adjusted her BP regimen.   2. Stage 3a chronic kidney disease ([place]) Denies changes in urine output but continues to get up several times per night to go to the bathroom.  3. Nocturia more than twice per night Was prescribed Myrbetriq but has not been taking medications. Continues to get up several times per night to void small amounts.   4. Left foot pain Chronic in nature, use to wear orthotics but has not been in several years. No new injuries. Some discomfort with ambulation.      Relevant past medical, surgical, family, and social history reviewed and updated as indicated.  Allergies and medications reviewed and updated. Data reviewed: Chart in Epic.   Past Medical History:  Diagnosis Date   Asthma    BPPV (benign paroxysmal positional vertigo)    Claustrophobia    Diverticulosis    GERD (gastroesophageal reflux disease)    H/O degenerative disc disease    Hiatal hernia    Hyperlipidemia    Hypertension    Kidney stones    Migraines    Osteoarthritis    Caused feet deformity   Osteopenia    Prediabetes    Preglaucoma    Raynauds syndrome     Rectocele    Rosacea    Sciatica    Sleep apnea    Spinal stenosis    Vertebrobasilar artery syndrome     Past Surgical History:  Procedure Laterality Date   ABDOMINAL HYSTERECTOMY  1978   ANTERIOR (CYSTOCELE) AND POSTERIOR REPAIR (RECTOCELE) WITH XENFORM GRAFT AND SACROSPINOUS FIXATION     BILATERAL OOPHORECTOMY  1997   bladder stones     kidney colic/ procedure performed in 1973 at [facility] Left 12/1987   neg   BREAST BIOPSY Right 02/1977   neg papilloma   CARDIAC CATHETERIZATION  2000   HERNIA REPAIR     plantar fibroma  Left 09/1997   Excised    Social History   Socioeconomic History   Marital status: Single    Spouse name: Not on file   Number of children: Not on file   Years of education: Not on file   Highest education level: Not on file  Occupational History   Occupation: Retired    Comment: [employer], drilling  Tobacco Use   Smoking status: Never   Smokeless tobacco: Never  Vaping Use   Vaping Use: Never used  Substance and Sexual Activity   Alcohol use: Yes    Alcohol/week: 2.0 standard drinks of alcohol  Types: 2 Glasses of wine per week    Comment: per week   Drug use: Never   Sexual activity: Not Currently    Partners: Male    Birth control/protection: Surgical  Other Topics Concern   Not on file  Social History Narrative   Right Handed   Lives in an apartment complex and her apartment is on ground level.    Drinks [employer] Coffee and Tea   Originally from [place], then lived in [place] most of her life - moved here 09/2019 - feels out of place, having trouble finding others with similar interests   She is agnostic - not religious (although raised Catholic) - strong scientific beliefs    Social Determinants of Health   Financial Resource Strain: Medium Risk (09/15/2020)   Overall Financial Resource Strain (CARDIA)    Difficulty of Paying Living Expenses: Somewhat hard  Food Insecurity: No Food  Insecurity (09/15/2020)   Hunger Vital Sign    Worried About Running Out of Food in the Last Year: Never true    Ran Out of Food in the Last Year: Never true  Transportation Needs: No Transportation Needs (09/15/2020)   PRAPARE - Administrator, Civil Service (Medical): No    Lack of Transportation (Non-Medical): No  Physical Activity: Insufficiently Active (09/15/2020)   Exercise Vital Sign    Days of Exercise per Week: 7 days    Minutes of Exercise per Session: 20 min  Stress: No Stress Concern Present (09/15/2020)   [employer] of Occupational Health - Occupational Stress Questionnaire    Feeling of Stress : Not at all  Social Connections: Socially Isolated (09/15/2020)   Social Connection and Isolation Panel [NHANES]    Frequency of Communication with Friends and Family: More than three times a week    Frequency of Social Gatherings with Friends and Family: Once a week    Attends Religious Services: Never    Database administrator or Organizations: No    Attends Banker Meetings: Never    Marital Status: Divorced  Catering manager Violence: Not At Risk (09/15/2020)   Humiliation, Afraid, Rape, and Kick questionnaire    Fear of Current or Ex-Partner: No    Emotionally Abused: No    Physically Abused: No    Sexually Abused: No    Outpatient Encounter Medications as of 05/20/2022  Medication Sig   albuterol (VENTOLIN HFA) 108 (90 Base) MCG/ACT inhaler Inhale 2 puffs into the lungs every 6 (six) hours as needed for wheezing or shortness of breath.   Ascorbic Acid 500 MG CAPS Take 500 mg by mouth daily.   Biotin 5000 MCG TABS Take 5,000 mcg by mouth daily.   calcium carbonate (TUMS - DOSED IN MG ELEMENTAL CALCIUM) 500 MG chewable tablet Chew 1 tablet by mouth as needed for indigestion or heartburn.   losartan (COZAAR) 25 MG tablet Take 0.5 tablets (12.5 mg total) by mouth every morning. Take 1/2 tablet with a full 25 mg tablet every morning for a total of 37.5 mg  every morning   Multiple Vitamin (MULTI-VITAMIN) tablet Take 1 tablet by mouth daily.   vitamin B-12 (CYANOCOBALAMIN) 100 MCG tablet Take 100 mcg by mouth daily.   diltiazem (CARDIZEM) 30 MG tablet Take 1 tablet (30 mg total) by mouth 2 (two) times daily as needed (For a heart rate over 100 bpm). (Patient not taking: Reported on 03/31/2022)   hydrALAZINE (APRESOLINE) 10 MG tablet Take 1 tablet (10 mg total)  by mouth as needed. Take (1) tablet as needed for blood pressure sustained of 170 or greater systolic (Patient not taking: Reported on 05/20/2022)   losartan (COZAAR) 25 MG tablet Take 1 tablet (25 mg total) by mouth 2 (two) times daily.   [DISCONTINUED] losartan (COZAAR) 25 MG tablet Take 1 tablet (25 mg total) by mouth 2 (two) times daily. (Patient taking differently: Take 25 mg by mouth daily. 1.2 tablets daily)   [DISCONTINUED] mirabegron ER (MYRBETRIQ) 25 MG TB24 tablet Take 1 tablet (25 mg total) by mouth daily. (Patient not taking: Reported on 03/31/2022)   No facility-administered encounter medications on file as of 05/20/2022.    Allergies  Allergen Reactions   Calcitonin ([place]) Other (See Comments)    LEFT BUNDLE BRANCH BLOCK   Estradiol Other (See Comments)    Headache   Fexofenadine Other (See Comments)    Syncope   Miacalcin [Calcitonin] Palpitations   Nsaids Other (See Comments)    Stomach ulcer after taking for 3 days in 2009. Negative H. Pylori test.   Diphenhydramine Other (See Comments)    Syncope   Amoxicillin-Pot Clavulanate Rash    Able to take plain amoxicillin   Ciprofloxacin Rash   Clarithromycin Other (See Comments)    CAN TAKE AZITHROMYCIN   Codeine Other (See Comments)   Doxepin Hcl Other (See Comments)    Heart arrhythmia   Epinephrine Other (See Comments)   Erythromycin Other (See Comments)    Stomach irritation   Fluticasone Other (See Comments)   Fluticasone Propionate Other (See Comments)    And similar corticoid steroids   Ipratropium Bromide  Other (See Comments)    Syncope   Latex Rash   Lisinopril Other (See Comments)    Other Reaction: BRUISING, CANKERS & ARTHRALGIAS   Nitrofurantoin Other (See Comments)    Multiple mild symptoms - would prefer not to take again   Other Other (See Comments)    Paraphenylendiamine/propanol amine/anti-cholinergic compounds   Sulfa Antibiotics Other (See Comments)   Tetracycline Other (See Comments)    Mouth sores after just 2 doses. DOXYCYCLINE is OK per patient    Review of Systems  Constitutional:  Negative for activity change, appetite change, chills, diaphoresis, fatigue, fever and unexpected weight change.  HENT: Negative.    Eyes: Negative.  Negative for photophobia and visual disturbance.  Respiratory:  Negative for cough, chest tightness and shortness of breath.   Cardiovascular:  Negative for chest pain, palpitations and leg swelling.  Gastrointestinal:  Negative for abdominal pain, blood in stool, constipation, diarrhea, nausea and vomiting.  Endocrine: Negative.   Genitourinary:  Positive for frequency. Negative for decreased urine volume, difficulty urinating, dysuria, enuresis, flank pain, genital sores, hematuria, menstrual problem, pelvic pain and urgency.  Musculoskeletal:  Positive for arthralgias and gait problem (feels off balance at times). Negative for myalgias, neck pain and neck stiffness.  Skin: Negative.   Allergic/Immunologic: Negative.   Neurological:  Negative for dizziness, tremors, seizures, syncope, facial asymmetry, speech difficulty, weakness, light-headedness, numbness and headaches.  Hematological: Negative.   Psychiatric/Behavioral:  Negative for confusion, hallucinations, sleep disturbance and suicidal ideas.   All other systems reviewed and are negative.       Objective:  BP (!) 170/60 Comment: at home before visit  Pulse 72   Temp (!) 97 F (36.1 C) (Temporal)   Ht 4\' 11"  (1.499 m)   Wt 131 lb 3.2 oz (59.5 kg)   SpO2 98%   BMI 26.50 kg/m     Wt  Readings from Last 3 Encounters:  05/20/22 131 lb 3.2 oz (59.5 kg)  03/31/22 138 lb (62.6 kg)  03/11/22 130 lb (59 kg)    Physical Exam Vitals and nursing note reviewed.  Constitutional:      General: She is not in acute distress.    Appearance: Normal appearance. She is well-developed and well-groomed. She is not ill-appearing, toxic-appearing or diaphoretic.  HENT:     Head: Normocephalic and atraumatic.     Jaw: There is normal jaw occlusion.     Right Ear: Hearing normal.     Left Ear: Hearing normal.     Nose: Nose normal.     Mouth/Throat:     Lips: Pink.     Mouth: Mucous membranes are moist.     Pharynx: Oropharynx is clear. Uvula midline.  Eyes:     General: Lids are normal.     Extraocular Movements: Extraocular movements intact.     Conjunctiva/sclera: Conjunctivae normal.     Pupils: Pupils are equal, round, and reactive to light.  Neck:     Thyroid: No thyroid mass, thyromegaly or thyroid tenderness.     Vascular: No carotid bruit or JVD.     Trachea: Trachea and phonation normal.  Cardiovascular:     Rate and Rhythm: Normal rate and regular rhythm.     Chest Wall: PMI is not displaced.     Pulses: Normal pulses.          Dorsalis pedis pulses are 2+ on the left side.       Posterior tibial pulses are 2+ on the left side.     Heart sounds: Murmur heard.     Systolic murmur is present with a grade of 2/6.     No friction rub. No gallop.  Pulmonary:     Effort: Pulmonary effort is normal. No respiratory distress.     Breath sounds: Normal breath sounds. No wheezing.  Abdominal:     General: Bowel sounds are normal. There is no distension or abdominal bruit.     Palpations: Abdomen is soft. There is no hepatomegaly or splenomegaly.     Tenderness: There is no abdominal tenderness. There is no right CVA tenderness or left CVA tenderness.     Hernia: No hernia is present.  Musculoskeletal:     Cervical back: Normal range of motion and neck supple.      Right lower leg: No edema.     Left lower leg: No edema.     Left foot: Decreased range of motion. Deformity present.  Feet:     Left foot:     Skin integrity: Skin integrity normal.  Lymphadenopathy:     Cervical: No cervical adenopathy.  Skin:    General: Skin is warm and dry.     Capillary Refill: Capillary refill takes less than 2 seconds.     Coloration: Skin is not cyanotic, jaundiced or pale.     Findings: No rash.  Neurological:     General: No focal deficit present.     Mental Status: She is alert and oriented to person, place, and time.     Sensory: Sensation is intact.     Motor: Motor function is intact.     Coordination: Coordination is intact.     Gait: Gait abnormal (using cane).     Deep Tendon Reflexes: Reflexes are normal and symmetric.  Psychiatric:        Attention and Perception: Attention and perception normal.  Mood and Affect: Mood and affect normal.        Speech: Speech normal.        Behavior: Behavior normal. Behavior is cooperative.        Thought Content: Thought content normal.        Cognition and Memory: Cognition and memory normal.        Judgment: Judgment normal.     Results for orders placed or performed in visit on 05/20/22  Urinalysis  Result Value Ref Range   Specific Gravity, UA 1.020 1.005 - 1.030   pH, UA 6.5 5.0 - 7.5   Color, UA Yellow Yellow   Appearance Ur Clear Clear   Leukocytes,UA Negative Negative   Protein,UA Negative Negative/Trace   Glucose, UA Negative Negative   Ketones, UA Negative Negative   RBC, UA Negative Negative   Bilirubin, UA Negative Negative   Urobilinogen, Ur 0.2 0.2 - 1.0 mg/dL   Nitrite, UA Negative Negative       Pertinent labs & imaging results that were available during my care of the patient were reviewed by me and considered in my medical decision making.  Assessment & Plan:  Dazha was seen today for hypertension and urinary frequency.  Diagnoses and all orders for this  visit:  Essential hypertension BP not controlled. Changes were not made in regimen today, as pt has not been taking the diltiazem or hydralazin when appropriate. Aware to take these medications when appropriate. Pt aware to report any persistent high or low readings. DASH diet and exercise encouraged. Exercise at least 150 minutes per week and increase as tolerated. Goal BMI > 25. Stress management encouraged. Avoid nicotine and tobacco product use. Avoid excessive alcohol and NSAID's. Avoid more than 2000 mg of sodium daily. Medications as prescribed. Follow up as scheduled. Follow up with cardiology as scheduled.  -     losartan (COZAAR) 25 MG tablet; Take 1 tablet (25 mg total) by mouth 2 (two) times daily. -     CBC with Differential/Platelet -     CMP14+EGFR -     Thyroid Panel With TSH  Stage 3a chronic kidney disease ([place]) Labs pending. Avoid NSAID therapy. Adequate hydration discussed in detail.  -     CBC with Differential/Platelet -     CMP14+EGFR  Nocturia more than twice per night Urine normal in office. Has not taken Myrbetriq as prescribed. Will refer to urology.  -     Urinalysis -     Urine Culture -     Ambulatory referral to Urology  Left foot pain Ongoing pain of left foot. No injuries reported. Concerned about gout. Unlikely gout as this deformity is in the midfoot and seems to be degenerative changes of the foot, specifically the area of the cuboid and lateral cuboid bone.  -     DG Foot Complete Left -     Uric Acid     Continue all other maintenance medications.  Follow up plan: Return in 6 months (on 11/18/2022), or if symptoms worsen or fail to improve, for scedule AWV  chronic follow up.   Continue healthy lifestyle choices, including diet (rich in fruits, vegetables, and lean proteins, and low in salt and simple carbohydrates) and exercise (at least 30 minutes of moderate physical activity daily).   The above assessment and management plan was discussed  with the patient. The patient verbalized understanding of and has agreed to the management plan. Patient is aware to call the clinic if they  develop any new symptoms or if symptoms persist or worsen. Patient is aware when to return to the clinic for a follow-up visit. Patient educated on when it is appropriate to go to the emergency department.   Monia Pouch, FNP-C [facility] [phone]

## 2022-05-21 LAB — CBC WITH DIFFERENTIAL/PLATELET
Basophils Absolute: 0 10*3/uL (ref 0.0–0.2)
Basos: 1 %
EOS (ABSOLUTE): 0 10*3/uL (ref 0.0–0.4)
Eos: 1 %
Hematocrit: 39.3 % (ref 34.0–46.6)
Hemoglobin: 13.2 g/dL (ref 11.1–15.9)
Immature Grans (Abs): 0 10*3/uL (ref 0.0–0.1)
Immature Granulocytes: 0 %
Lymphocytes Absolute: 1 10*3/uL (ref 0.7–3.1)
Lymphs: 18 %
MCH: 29.7 pg (ref 26.6–33.0)
MCHC: 33.6 g/dL (ref 31.5–35.7)
MCV: 88 fL (ref 79–97)
Monocytes Absolute: 0.6 10*3/uL (ref 0.1–0.9)
Monocytes: 10 %
Neutrophils Absolute: 4 10*3/uL (ref 1.4–7.0)
Neutrophils: 70 %
Platelets: 198 10*3/uL (ref 150–450)
RBC: 4.45 x10E6/uL (ref 3.77–5.28)
RDW: 13.9 % (ref 11.7–15.4)
WBC: 5.7 10*3/uL (ref 3.4–10.8)

## 2022-05-21 LAB — CMP14+EGFR
ALT: 19 IU/L (ref 0–32)
AST: 22 IU/L (ref 0–40)
Albumin/Globulin Ratio: 2.5 — ABNORMAL HIGH (ref 1.2–2.2)
Albumin: 4.8 g/dL — ABNORMAL HIGH (ref 3.7–4.7)
Alkaline Phosphatase: 79 IU/L (ref 44–121)
BUN/Creatinine Ratio: 21 (ref 12–28)
BUN: 15 mg/dL (ref 8–27)
Bilirubin Total: 0.4 mg/dL (ref 0.0–1.2)
CO2: 25 mmol/L (ref 20–29)
Calcium: 9.7 mg/dL (ref 8.7–10.3)
Chloride: 96 mmol/L (ref 96–106)
Creatinine, Ser: 0.72 mg/dL (ref 0.57–1.00)
Globulin, Total: 1.9 g/dL (ref 1.5–4.5)
Glucose: 92 mg/dL (ref 70–99)
Potassium: 4 mmol/L (ref 3.5–5.2)
Sodium: 136 mmol/L (ref 134–144)
Total Protein: 6.7 g/dL (ref 6.0–8.5)
eGFR: 80 mL/min/{1.73_m2} (ref >59)

## 2022-05-21 LAB — THYROID PANEL WITH TSH
Free Thyroxine Index: 2.5 (ref 1.2–4.9)
T3 Uptake Ratio: 30 % (ref 24–39)
T4, Total: 8.3 ug/dL (ref 4.5–12.0)
TSH: 2.63 u[IU]/mL (ref 0.450–4.500)

## 2022-05-21 LAB — URIC ACID: Uric Acid: 3.9 mg/dL (ref 3.1–7.9)

## 2022-05-23 LAB — URINE CULTURE

## 2022-05-25 MED ORDER — CEPHALEXIN 500 MG PO CAPS: 500.0000 mg | ORAL_CAPSULE | Freq: Three times a day (TID) | ORAL | 0 refills | Status: DC

## 2022-05-25 NOTE — Addendum Note (Signed)
Addended by: Baruch Gouty on: 05/25/2022 04:49 PM   Modules accepted: Orders

## 2022-05-26 ENCOUNTER — Other Ambulatory Visit: Payer: Self-pay

## 2022-05-26 DIAGNOSIS — N3000 Acute cystitis without hematuria: Secondary | ICD-10-CM

## 2022-05-26 MED ORDER — CEPHALEXIN 500 MG PO CAPS: 500.0000 mg | ORAL_CAPSULE | Freq: Three times a day (TID) | ORAL | 0 refills | Status: AC

## 2022-05-26 NOTE — Progress Notes (Signed)
Patient r/c

## 2022-05-27 ENCOUNTER — Telehealth: Payer: Self-pay | Admitting: Family Medicine

## 2022-05-27 NOTE — Telephone Encounter (Signed)
Called and spoke to patient in regards to antibiotic and results per notes in chart

## 2022-05-28 ENCOUNTER — Telehealth: Payer: Self-pay

## 2022-05-28 NOTE — Telephone Encounter (Signed)
I do not know that I have space right now, mostly excepting relatives or family members of my current patients about it right now

## 2022-05-28 NOTE — Telephone Encounter (Signed)
Patient wants to change PCP to an MD

## 2022-05-30 NOTE — Telephone Encounter (Signed)
No

## 2022-05-31 NOTE — Telephone Encounter (Signed)
Called patient and made her aware. Patient voiced understanding.

## 2022-06-07 ENCOUNTER — Ambulatory Visit (INDEPENDENT_AMBULATORY_CARE_PROVIDER_SITE_OTHER): Payer: Medicare Other

## 2022-06-07 VITALS — Ht 59.0 in | Wt 132.0 lb

## 2022-06-07 DIAGNOSIS — Z0100 Encounter for examination of eyes and vision without abnormal findings: Secondary | ICD-10-CM

## 2022-06-07 DIAGNOSIS — Z0000 Encounter for general adult medical examination without abnormal findings: Secondary | ICD-10-CM | POA: Diagnosis not present

## 2022-06-07 IMAGING — MG MM DIGITAL SCREENING BILAT W/ TOMO AND CAD
8 series · 8 of 24 positions shown · non-contrast
Comparison: Previous exam(s).

CLINICAL DATA: Screening.

EXAM:
DIGITAL SCREENING BILATERAL MAMMOGRAM WITH TOMOSYNTHESIS AND CAD
TECHNIQUE: Bilateral screening digital craniocaudal and mediolateral oblique
mammograms were obtained. Bilateral screening digital breast
tomosynthesis was performed. The images were evaluated with
computer-aided detection.

[L CC synth-2D]
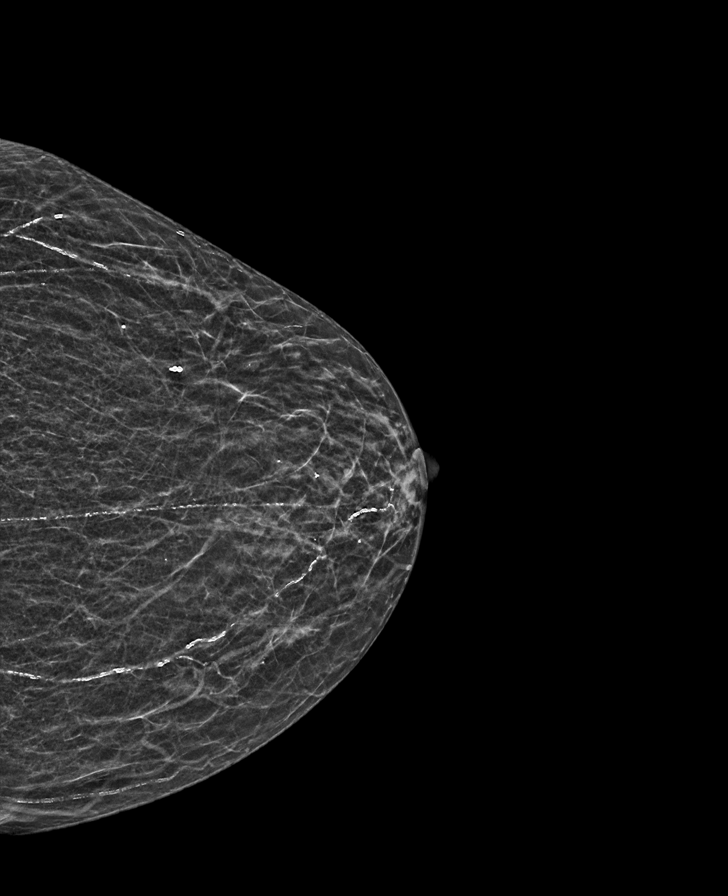

[R MLO synth-2D]
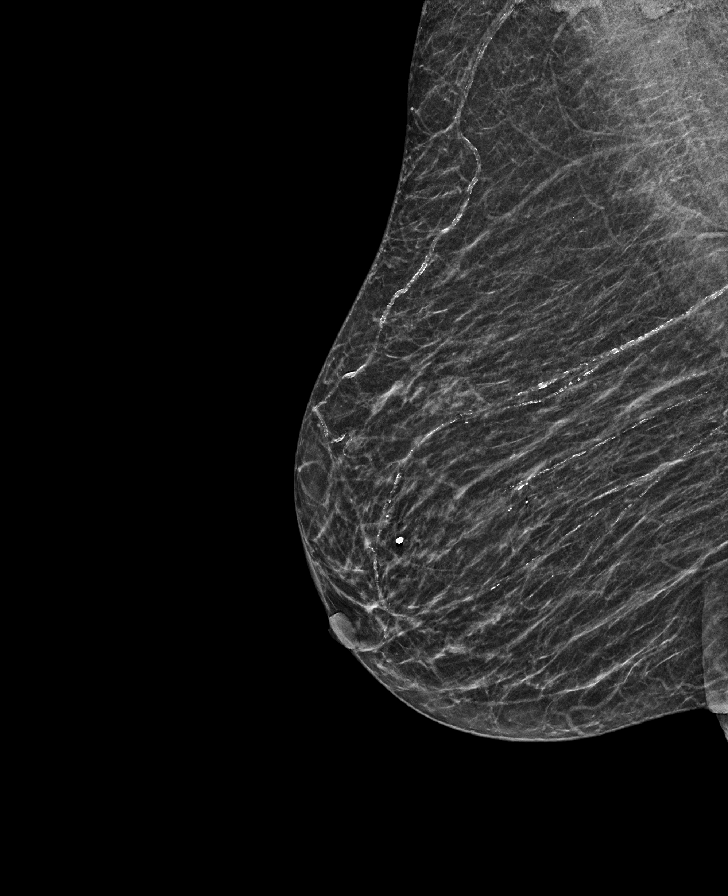

[L MLO synth-2D]
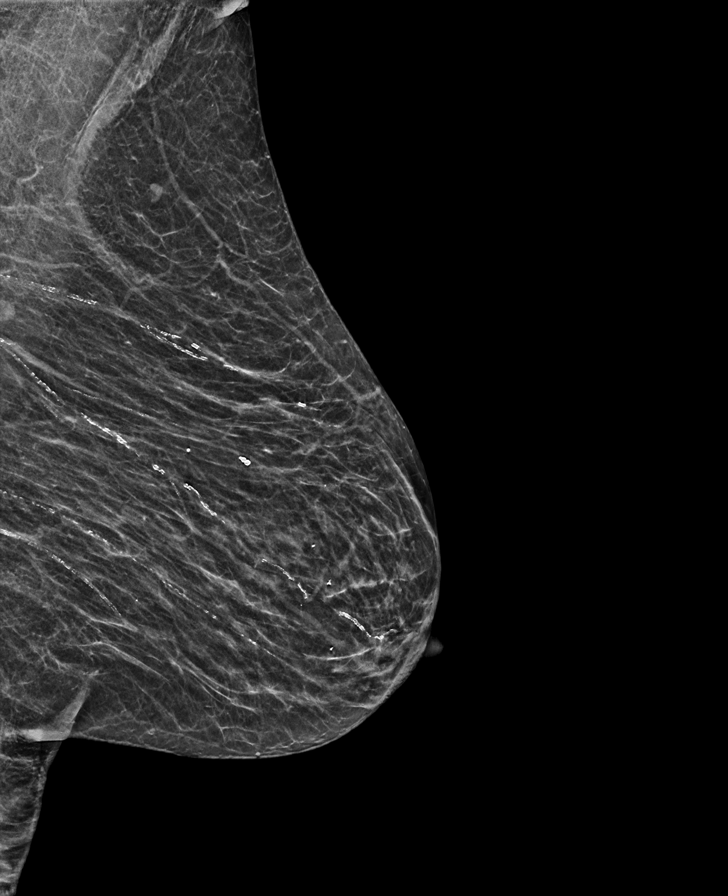

[R CC synth-2D]
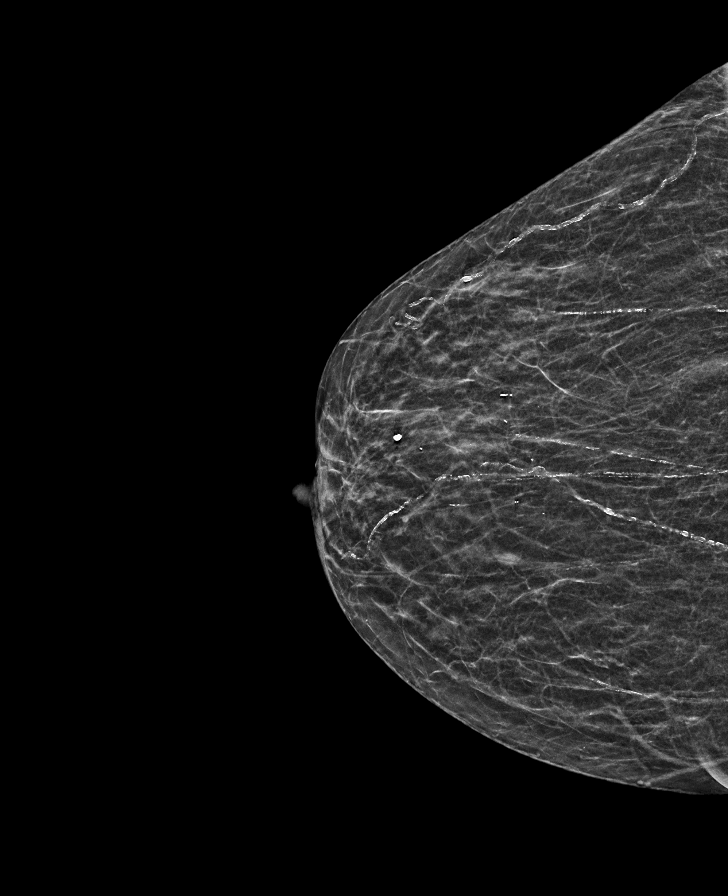

[R CC tomo · tomo slice 19/37.0]
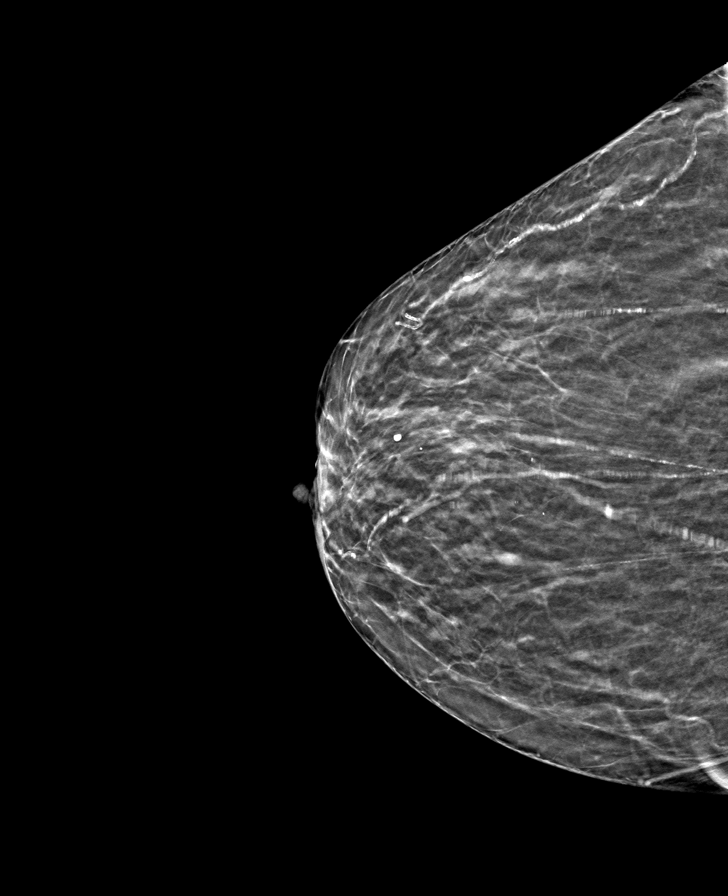

[L MLO tomo · tomo slice 21/40.0]
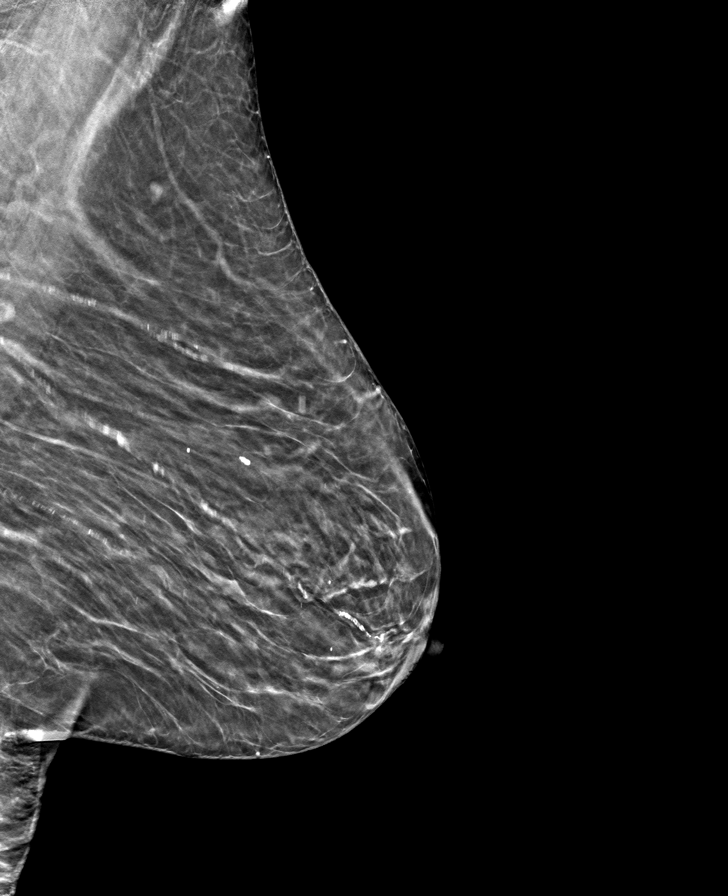

[L CC tomo · tomo slice 17/33.0]
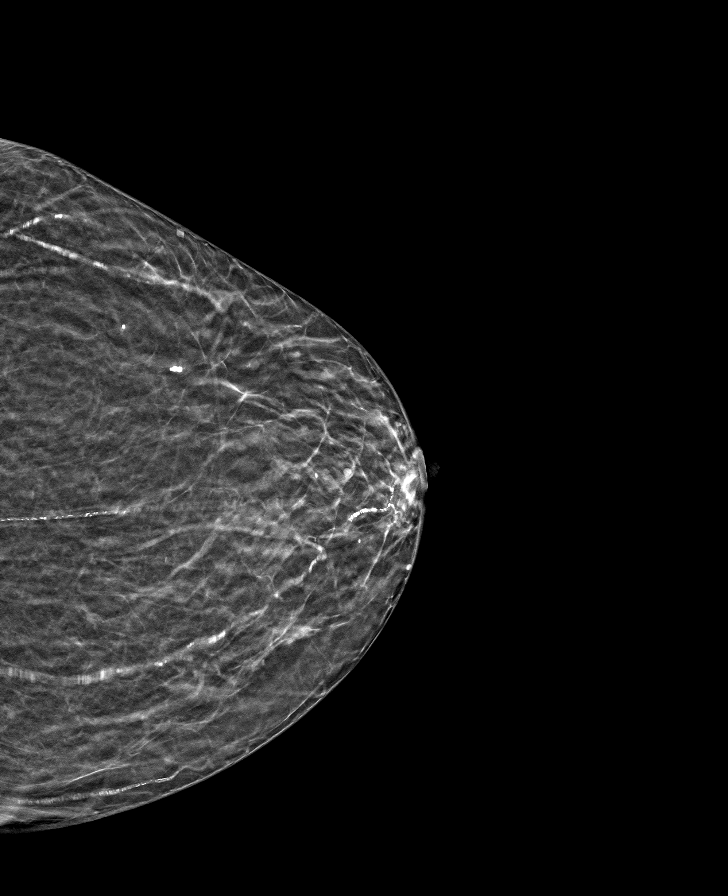

[R MLO tomo · tomo slice 20/39.0]
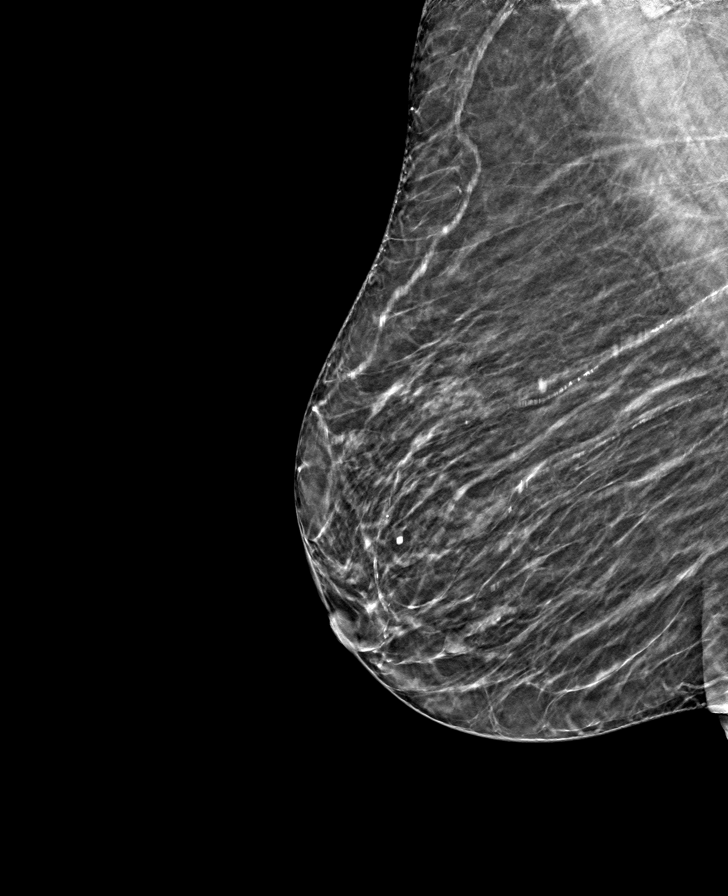

[8 of 24 positions shown; findings below may reference images not displayed]

ACR Breast Density Category b: There are scattered areas of
fibroglandular density.
FINDINGS: There are no findings suspicious for malignancy. The images were
evaluated with computer-aided detection.
IMPRESSION: No mammographic evidence of malignancy. A result letter of this
screening mammogram will be mailed directly to the patient.

RECOMMENDATION:
Screening mammogram in one year. (Code:WJ-I-BG6)

BI-RADS CATEGORY  1: Negative.

## 2022-06-07 NOTE — Progress Notes (Addendum)
Subjective:   Heidi Dorsey is a 89 y.o. female who presents for Medicare Annual (Subsequent) preventive examination. I connected with  Chyane Rabon on 06/07/22 by a audio enabled telemedicine application and verified that I am speaking with the correct person using two identifiers.  Patient Location: Home  Provider Location: Home Office  I discussed the limitations of evaluation and management by telemedicine. The patient expressed understanding and agreed to proceed.  Review of Systems     Cardiac Risk Factors include: advanced age (>90mn, >>22women);hypertension     Objective:    Today's Vitals   06/07/22 1537  Weight: 132 lb (59.9 kg)  Height: '4\' 11"'$  (1.499 m)   Body mass index is 26.66 kg/m.     06/07/2022    3:45 PM 03/11/2022    7:58 AM 01/27/2022    2:00 PM 09/15/2020    2:45 PM 02/29/2020   11:18 AM  Advanced Directives  Does Patient Have a Medical Advance Directive? Yes No No Yes Yes  Type of [place] will   [place] will   Copy of [place] Chart? No - copy requested   No - copy requested     Current Medications (verified) Outpatient Encounter Medications as of 06/07/2022  Medication Sig   albuterol (VENTOLIN HFA) 108 (90 Base) MCG/ACT inhaler Inhale 2 puffs into the lungs every 6 (six) hours as needed for wheezing or shortness of breath.   Ascorbic Acid 500 MG CAPS Take 500 mg by mouth daily.   Biotin 5000 MCG TABS Take 5,000 mcg by mouth daily.   calcium carbonate (TUMS - DOSED IN MG ELEMENTAL CALCIUM) 500 MG chewable tablet Chew 1 tablet by mouth as needed for indigestion or heartburn.   diltiazem (CARDIZEM) 30 MG tablet Take 1 tablet (30 mg total) by mouth 2 (two) times daily as needed (For a heart rate over 100 bpm).   hydrALAZINE (APRESOLINE) 10 MG tablet Take 1 tablet (10 mg total) by mouth as needed. Take (1) tablet as needed for blood pressure sustained of  170 or greater systolic   losartan (COZAAR) 25 MG tablet Take 0.5 tablets (12.5 mg total) by mouth every morning. Take 1/2 tablet with a full 25 mg tablet every morning for a total of 37.5 mg every morning   losartan (COZAAR) 25 MG tablet Take 1 tablet (25 mg total) by mouth 2 (two) times daily.   Multiple Vitamin (MULTI-VITAMIN) tablet Take 1 tablet by mouth daily.   vitamin B-12 (CYANOCOBALAMIN) 100 MCG tablet Take 100 mcg by mouth daily.   No facility-administered encounter medications on file as of 06/07/2022.    Allergies (verified) Calcitonin (salmon), Estradiol, Fexofenadine, Miacalcin [calcitonin], Nsaids, Diphenhydramine, Amoxicillin-pot clavulanate, Ciprofloxacin, Clarithromycin, Codeine, Doxepin hcl, Epinephrine, Erythromycin, Fluticasone, Fluticasone propionate, Ipratropium bromide, Latex, Lisinopril, Nitrofurantoin, Other, Sulfa antibiotics, and Tetracycline   History: Past Medical History:  Diagnosis Date   Asthma    BPPV (benign paroxysmal positional vertigo)    Claustrophobia    Diverticulosis    GERD (gastroesophageal reflux disease)    H/O degenerative disc disease    Hiatal hernia    Hyperlipidemia    Hypertension    Kidney stones    Migraines    Osteoarthritis    Caused feet deformity   Osteopenia    Prediabetes    Preglaucoma    Raynauds syndrome    Rectocele    Rosacea    Sciatica    Sleep apnea  Spinal stenosis    Vertebrobasilar artery syndrome    Past Surgical History:  Procedure Laterality Date   ABDOMINAL HYSTERECTOMY  1978   ANTERIOR (CYSTOCELE) AND POSTERIOR REPAIR (RECTOCELE) WITH XENFORM GRAFT AND SACROSPINOUS FIXATION     BILATERAL OOPHORECTOMY  1997   bladder stones     kidney colic/ procedure performed in 1973 at [facility] Left 12/1987   neg   BREAST BIOPSY Right 02/1977   neg papilloma   CARDIAC CATHETERIZATION  2000   HERNIA REPAIR     plantar fibroma  Left 09/1997   Excised   Family History   Problem Relation Age of Onset   Osteoporosis Mother    Ovarian cancer Mother    Uterine cancer Mother    Brain cancer Father    Bladder Cancer Brother    Rectal cancer Brother    Skin cancer Brother    Osteoporosis Sister    Diabetes Paternal Grandmother    Breast cancer Neg Hx    Social History   Socioeconomic History   Marital status: Single    Spouse name: Not on file   Number of children: Not on file   Years of education: Not on file   Highest education level: Not on file  Occupational History   Occupation: Retired    Comment: [employer], drilling  Tobacco Use   Smoking status: Never   Smokeless tobacco: Never  Vaping Use   Vaping Use: Never used  Substance and Sexual Activity   Alcohol use: Yes    Alcohol/week: 2.0 standard drinks of alcohol    Types: 2 Glasses of wine per week    Comment: per week   Drug use: Never   Sexual activity: Not Currently    Partners: Male    Birth control/protection: Surgical  Other Topics Concern   Not on file  Social History Narrative   Right Handed   Lives in an apartment complex and her apartment is on ground level.    Drinks Half [employer] Coffee and Tea   Originally from [place], then lived in [place] most of her life - moved here 09/2019 - feels out of place, having trouble finding others with similar interests   She is agnostic - not religious (although raised Catholic) - strong scientific beliefs    Social Determinants of Health   Financial Resource Strain: Low Risk  (06/07/2022)   Overall Financial Resource Strain (CARDIA)    Difficulty of Paying Living Expenses: Not hard at all  Food Insecurity: No Food Insecurity (06/07/2022)   Hunger Vital Sign    Worried About Running Out of Food in the Last Year: Never true    [place] in the Last Year: Never true  Transportation Needs: No Transportation Needs (06/07/2022)   PRAPARE - Hydrologist (Medical): No    Lack of Transportation  (Non-Medical): No  Physical Activity: Insufficiently Active (06/07/2022)   Exercise Vital Sign    Days of Exercise per Week: 3 days    Minutes of Exercise per Session: 30 min  Stress: No Stress Concern Present (06/07/2022)   [place]    Feeling of Stress : Not at all  Social Connections: Socially Isolated (06/07/2022)   Social Connection and Isolation Panel [NHANES]    Frequency of Communication with Friends and Family: More than three times a week    Frequency of  Social Gatherings with Friends and Family: More than three times a week    Attends Religious Services: Never    Marine scientist or Organizations: No    Attends Archivist Meetings: Never    Marital Status: Widowed    Tobacco Counseling Counseling given: Not Answered   Clinical Intake:  Pre-visit preparation completed: Yes  Pain : No/denies pain     Nutritional Risks: None Diabetes: No  How often do you need to have someone help you when you read instructions, pamphlets, or other written materials from your doctor or pharmacy?: 1 - Never  Diabetic?no   Interpreter Needed?: No  Information entered by :: Jadene Pierini, LPN   Activities of Daily Living    06/07/2022    3:45 PM  In your present state of health, do you have any difficulty performing the following activities:  Hearing? 0  Vision? 0  Difficulty concentrating or making decisions? 0  Walking or climbing stairs? 0  Dressing or bathing? 0  Doing errands, shopping? 0  Preparing Food and eating ? N  Using the Toilet? N  In the past six months, have you accidently leaked urine? N  Do you have problems with loss of bowel control? N  Managing your Medications? N  Managing your Finances? N  Housekeeping or managing your Housekeeping? N    Patient Care Team: Baruch Gouty, FNP as PCP - General (Family Medicine) Alda Berthold, DO as Consulting Physician  (Neurology)  Indicate any recent Medical Services you may have received from other than [facility] providers in the past year (date may be approximate).     Assessment:   This is a routine wellness examination for Gretchen.  Hearing/Vision screen Vision Screening - Comments:: Referral 06/07/2022  Dietary issues and exercise activities discussed: Current Exercise Habits: Home exercise routine, Type of exercise: walking, Time (Minutes): 30, Frequency (Times/Week): 3, Weekly Exercise (Minutes/Week): 90, Intensity: Mild, Exercise limited by: orthopedic condition(s)   Goals Addressed             This Visit's Progress    DIET - INCREASE WATER INTAKE         Depression Screen    06/07/2022    3:42 PM 05/20/2022   11:50 AM 02/16/2022   10:49 AM 11/24/2021   11:16 AM 08/19/2021   11:14 AM 08/04/2021    9:32 AM 06/30/2021    2:03 PM  PHQ 2/9 Scores  PHQ - 2 Score 0 '1 1 3 2 2 1  '$ PHQ- 9 Score 0 '7 7 11 11 12 7    '$ Fall Risk    06/07/2022    3:39 PM 05/20/2022   11:50 AM 02/16/2022   10:48 AM 11/24/2021   11:15 AM 08/04/2021    9:32 AM  Fall Risk   Falls in the past year? 0 0 0 0 1  Number falls in past yr: 0    1  Injury with Fall? 0    0  Risk for fall due to : No Fall Risks      Follow up Falls prevention discussed    Falls prevention discussed    FALL RISK PREVENTION PERTAINING TO THE HOME:  Any stairs in or around the home? No  If so, are there any without handrails? No  Home free of loose throw rugs in walkways, pet beds, electrical cords, etc? Yes  Adequate lighting in your home to reduce risk of falls? Yes   ASSISTIVE DEVICES UTILIZED TO PREVENT  FALLS:  Life alert? Yes  Use of a cane, walker or w/c? Yes  Grab bars in the bathroom? Yes  Shower chair or bench in shower? No  Elevated toilet seat or a handicapped toilet? No        06/07/2022    3:49 PM 06/07/2022    3:46 PM 09/15/2020    2:20 PM  6CIT Screen  What Year? 0 points 0 points 0 points  What month? 0 points 0  points 0 points  What time? 0 points 0 points 0 points  Count back from 20 0 points 0 points 0 points  Months in reverse 2 points 0 points 0 points  Repeat phrase 2 points 0 points 0 points  Total Score 4 points 0 points 0 points    Immunizations Immunization History  Administered Date(s) Administered   COVID-19, mRNA, vaccine(Comirnaty)12 years and older 01/19/2022   Fluad Quad(high Dose 65+) 01/04/2020, 12/31/2020, 01/05/2022   Influenza, High Dose Seasonal PF 01/11/2017, 12/19/2018, 12/19/2018   Influenza,inj,Quad PF,6+ Mos 01/23/2014, 01/21/2015   Influenza-Unspecified 01/14/2011, 01/20/2012, 12/28/2012, 01/23/2014, 12/24/2015, 01/11/2017, 12/23/2017   Moderna Covid-19 Vaccine Bivalent Booster 65yr & up 01/24/2021, 06/02/2021   PFIZER Comirnaty(Gray Top)Covid-19 Tri-Sucrose Vaccine 05/16/2019   PFIZER(Purple Top)SARS-COV-2 Vaccination 05/16/2019, 06/06/2019, 01/16/2020   Pneumococcal Conjugate-13 11/28/2013   Pneumococcal Polysaccharide-23 02/17/1999   Respiratory Syncytial Virus Vaccine,Recomb Aduvanted(Arexvy) 01/05/2022   Td 02/22/2003   Tdap 11/23/2012   Zoster Recombinat (Shingrix) 06/02/2021, 11/25/2021   Zoster, Live 04/12/2006    TDAP status: Due, Education has been provided regarding the importance of this vaccine. Advised may receive this vaccine at local pharmacy or Health Dept. Aware to provide a copy of the vaccination record if obtained from local pharmacy or Health Dept. Verbalized acceptance and understanding.  Flu Vaccine status: Up to date  Pneumococcal vaccine status: Due, Education has been provided regarding the importance of this vaccine. Advised may receive this vaccine at local pharmacy or Health Dept. Aware to provide a copy of the vaccination record if obtained from local pharmacy or Health Dept. Verbalized acceptance and understanding.  Covid-19 vaccine status: Completed vaccines  Qualifies for Shingles Vaccine? Yes   Zostavax completed Yes    Shingrix Completed?: Yes  Screening Tests Health Maintenance  Topic Date Due   MAMMOGRAM  07/09/2021   DTaP/Tdap/Td (3 - Td or Tdap) 11/24/2022   Medicare Annual Wellness (AWV)  06/08/2023   Pneumonia Vaccine 89 Years old  Completed   INFLUENZA VACCINE  Completed   DEXA SCAN  Completed   COVID-19 Vaccine  Completed   Zoster Vaccines- Shingrix  Completed   HPV VACCINES  Aged Out    Health Maintenance  Health Maintenance Due  Topic Date Due   MAMMOGRAM  07/09/2021    Colorectal cancer screening: No longer required.   Mammogram status: No longer required due to age.  Bone Density status: Ordered declined at this time . Pt provided with contact info and advised to call to schedule appt.  Lung Cancer Screening: (Low Dose CT Chest recommended if Age 87-80years, 30 pack-year currently smoking OR have quit w/in 15years.) does not qualify.   Lung Cancer Screening Referral: n/a  Additional Screening:  Hepatitis C Screening: does not qualify;   Vision Screening: Recommended annual ophthalmology exams for early detection of glaucoma and other disorders of the eye. Is the patient up to date with their annual eye exam?  No  Who is the provider or what is the name of the office in which the  patient attends annual eye exams? Referral  If pt is not established with a provider, would they like to be referred to a provider to establish care? No .   Dental Screening: Recommended annual dental exams for proper oral hygiene  Community Resource Referral / Chronic Care Management: CRR required this visit?  No   CCM required this visit?  No      Plan:     I have personally reviewed and noted the following in the patient's chart:   Medical and social history Use of alcohol, tobacco or illicit drugs  Current medications and supplements including opioid prescriptions. Patient is not currently taking opioid prescriptions. Functional ability and status Nutritional status Physical  activity Advanced directives List of other physicians Hospitalizations, surgeries, and ER visits in previous 12 months Vitals Screenings to include cognitive, depression, and falls Referrals and appointments  In addition, I have reviewed and discussed with patient certain preventive protocols, quality metrics, and best practice recommendations. A written personalized care plan for preventive services as well as general preventive health recommendations were provided to patient.     Daphane Shepherd, LPN   [id number]   Nurse Notes: referral eye doctor 002/26/2024

## 2022-06-07 NOTE — Patient Instructions (Signed)
Heidi Dorsey , Thank you for taking time to come for your Medicare Wellness Visit. I appreciate your ongoing commitment to your health goals. Please review the following plan we discussed and let me know if I can assist you in the future.   These are the goals we discussed:  Goals      DIET - INCREASE WATER INTAKE     Patient Stated     She wants help applying for Medicaid and/or getting assistance with costs of prescription eyeglasses and medications.  Also, hopes to meet others with similar interests as her.        This is a list of the screening recommended for you and due dates:  Health Maintenance  Topic Date Due   Mammogram  07/09/2021   DTaP/Tdap/Td vaccine (3 - Td or Tdap) 11/24/2022   Medicare Annual Wellness Visit  06/08/2023   Pneumonia Vaccine  Completed   Flu Shot  Completed   DEXA scan (bone density measurement)  Completed   COVID-19 Vaccine  Completed   Zoster (Shingles) Vaccine  Completed   HPV Vaccine  Aged Out    Advanced directives: Advance directive discussed with you today. I have provided a copy for you to complete at home and have notarized. Once this is complete please bring a copy in to our office so we can scan it into your chart.   Conditions/risks identified: Aim for 30 minutes of exercise or brisk walking, 6-8 glasses of water, and 5 servings of fruits and vegetables each day.   Next appointment: Follow up in one year for your annual wellness visit    Preventive Care 65 Years and Older, Female Preventive care refers to lifestyle choices and visits with your health care provider that can promote health and wellness. What does preventive care include? A yearly physical exam. This is also called an annual well check. Dental exams once or twice a year. Routine eye exams. Ask your health care provider how often you should have your eyes checked. Personal lifestyle choices, including: Daily care of your teeth and gums. Regular physical activity. Eating  a healthy diet. Avoiding tobacco and drug use. Limiting alcohol use. Practicing safe sex. Taking low-dose aspirin every day. Taking vitamin and mineral supplements as recommended by your health care provider. What happens during an annual well check? The services and screenings done by your health care provider during your annual well check will depend on your age, overall health, lifestyle risk factors, and family history of disease. Counseling  Your health care provider may ask you questions about your: Alcohol use. Tobacco use. Drug use. Emotional well-being. Home and relationship well-being. Sexual activity. Eating habits. History of falls. Memory and ability to understand (cognition). Work and work Statistician. Reproductive health. Screening  You may have the following tests or measurements: Height, weight, and BMI. Blood pressure. Lipid and cholesterol levels. These may be checked every 5 years, or more frequently if you are over 20 years old. Skin check. Lung cancer screening. You may have this screening every year starting at age 18 if you have a 30-pack-year history of smoking and currently smoke or have quit within the past 15 years. Fecal occult blood test (FOBT) of the stool. You may have this test every year starting at age 22. Flexible sigmoidoscopy or colonoscopy. You may have a sigmoidoscopy every 5 years or a colonoscopy every 10 years starting at age 77. Hepatitis C blood test. Hepatitis B blood test. Sexually transmitted disease (STD) testing. Diabetes screening.  This is done by checking your blood sugar (glucose) after you have not eaten for a while (fasting). You may have this done every 1-3 years. Bone density scan. This is done to screen for osteoporosis. You may have this done starting at age 1. Mammogram. This may be done every 1-2 years. Talk to your health care provider about how often you should have regular mammograms. Talk with your health care  provider about your test results, treatment options, and if necessary, the need for more tests. Vaccines  Your health care provider may recommend certain vaccines, such as: Influenza vaccine. This is recommended every year. Tetanus, diphtheria, and acellular pertussis (Tdap, Td) vaccine. You may need a Td booster every 10 years. Zoster vaccine. You may need this after age 48. Pneumococcal 13-valent conjugate (PCV13) vaccine. One dose is recommended after age 69. Pneumococcal polysaccharide (PPSV23) vaccine. One dose is recommended after age 103. Talk to your health care provider about which screenings and vaccines you need and how often you need them. This information is not intended to replace advice given to you by your health care provider. Make sure you discuss any questions you have with your health care provider. Document Released: 04/25/2015 Document Revised: 12/17/2015 Document Reviewed: 01/28/2015 Elsevier Interactive Patient Education  2017 [place] Prevention in the Home Falls can cause injuries. They can happen to people of all ages. There are many things you can do to make your home safe and to help prevent falls. What can I do on the outside of my home? Regularly fix the edges of walkways and driveways and fix any cracks. Remove anything that might make you trip as you walk through a door, such as a raised step or threshold. Trim any bushes or trees on the path to your home. Use bright outdoor lighting. Clear any walking paths of anything that might make someone trip, such as rocks or tools. Regularly check to see if handrails are loose or broken. Make sure that both sides of any steps have handrails. Any raised decks and porches should have guardrails on the edges. Have any leaves, snow, or ice cleared regularly. Use sand or salt on walking paths during winter. Clean up any spills in your garage right away. This includes oil or grease spills. What can I do in the  bathroom? Use night lights. Install grab bars by the toilet and in the tub and shower. Do not use towel bars as grab bars. Use non-skid mats or decals in the tub or shower. If you need to sit down in the shower, use a plastic, non-slip stool. Keep the floor dry. Clean up any water that spills on the floor as soon as it happens. Remove soap buildup in the tub or shower regularly. Attach bath mats securely with double-sided non-slip rug tape. Do not have throw rugs and other things on the floor that can make you trip. What can I do in the bedroom? Use night lights. Make sure that you have a light by your bed that is easy to reach. Do not use any sheets or blankets that are too big for your bed. They should not hang down onto the floor. Have a firm chair that has side arms. You can use this for support while you get dressed. Do not have throw rugs and other things on the floor that can make you trip. What can I do in the kitchen? Clean up any spills right away. Avoid walking on wet floors. Keep items  that you use a lot in easy-to-reach places. If you need to reach something above you, use a strong step stool that has a grab bar. Keep electrical cords out of the way. Do not use floor polish or wax that makes floors slippery. If you must use wax, use non-skid floor wax. Do not have throw rugs and other things on the floor that can make you trip. What can I do with my stairs? Do not leave any items on the stairs. Make sure that there are handrails on both sides of the stairs and use them. Fix handrails that are broken or loose. Make sure that handrails are as long as the stairways. Check any carpeting to make sure that it is firmly attached to the stairs. Fix any carpet that is loose or worn. Avoid having throw rugs at the top or bottom of the stairs. If you do have throw rugs, attach them to the floor with carpet tape. Make sure that you have a light switch at the top of the stairs and the  bottom of the stairs. If you do not have them, ask someone to add them for you. What else can I do to help prevent falls? Wear shoes that: Do not have high heels. Have rubber bottoms. Are comfortable and fit you well. Are closed at the toe. Do not wear sandals. If you use a stepladder: Make sure that it is fully opened. Do not climb a closed stepladder. Make sure that both sides of the stepladder are locked into place. Ask someone to hold it for you, if possible. Clearly mark and make sure that you can see: Any grab bars or handrails. First and last steps. Where the edge of each step is. Use tools that help you move around (mobility aids) if they are needed. These include: Canes. Walkers. Scooters. Crutches. Turn on the lights when you go into a dark area. Replace any light bulbs as soon as they burn out. Set up your furniture so you have a clear path. Avoid moving your furniture around. If any of your floors are uneven, fix them. If there are any pets around you, be aware of where they are. Review your medicines with your doctor. Some medicines can make you feel dizzy. This can increase your chance of falling. Ask your doctor what other things that you can do to help prevent falls. This information is not intended to replace advice given to you by your health care provider. Make sure you discuss any questions you have with your health care provider. Document Released: 01/23/2009 Document Revised: 09/04/2015 Document Reviewed: 05/03/2014 Elsevier Interactive Patient Education  2017 [employer].

## 2022-06-14 ENCOUNTER — Ambulatory Visit: Payer: Self-pay | Admitting: *Deleted

## 2022-06-14 NOTE — Telephone Encounter (Signed)
Summary: Blood Pressure Advice   Pt is calling to report that her BP is 143/97 with pulse 70. Pt is reporting headache Please advise         Attempted to call patient- no answer, unable to leave message.

## 2022-06-14 NOTE — Telephone Encounter (Signed)
Pt answered call by asking who was calling. I introduced myself. She said her blood pressure has been crazy. She said she took a losartan, and was going to lay down and try to sleep. She said thank you and good bye.   No triage performed.  Summary: Blood Pressure Advice   Pt is calling to report that her BP is 143/97 with pulse 70. Pt is reporting headache Please advise

## 2022-06-24 ENCOUNTER — Telehealth: Payer: Self-pay | Admitting: Family Medicine

## 2022-06-24 ENCOUNTER — Telehealth: Payer: Self-pay

## 2022-06-24 NOTE — Telephone Encounter (Signed)
Patient called to report that her blood pressure this morning was 107/69, pulse 66.  She is prescribed Losartan 25 mg take twice daily and is concerned about taking this with her blood pressure being low.  She said Dr. Percival Spanish had told her to take it as 1-1/2 tabs in morning and 1 tab in the evening.  She would like to know if she should change medication, change dose, or what you recommend.  Also, when she was seen on 05/20/22 she was treated for a UTI with Keflex 500 mg TID x 5 days.  She finished her antibiotic but does not feel infection is completely gone.  Has urinary frequency and some dysuria.  She would like to bring by a urine sample to be tested.   Please advise and route to clinical pool.

## 2022-06-24 NOTE — Telephone Encounter (Signed)
Aware form faxed to apartment complex today

## 2022-06-24 NOTE — Telephone Encounter (Signed)
Lmtcb.

## 2022-06-25 ENCOUNTER — Encounter: Payer: Self-pay | Admitting: Family Medicine

## 2022-06-25 ENCOUNTER — Ambulatory Visit (INDEPENDENT_AMBULATORY_CARE_PROVIDER_SITE_OTHER): Payer: Medicare Other | Admitting: Family Medicine

## 2022-06-25 VITALS — BP 138/70 | HR 60 | Ht 59.0 in | Wt 133.0 lb

## 2022-06-25 DIAGNOSIS — N3281 Overactive bladder: Secondary | ICD-10-CM | POA: Diagnosis not present

## 2022-06-25 DIAGNOSIS — R399 Unspecified symptoms and signs involving the genitourinary system: Secondary | ICD-10-CM

## 2022-06-25 LAB — URINALYSIS
Bilirubin, UA: NEGATIVE
Glucose, UA: NEGATIVE
Ketones, UA: NEGATIVE
Leukocytes,UA: NEGATIVE
Nitrite, UA: NEGATIVE
Protein,UA: NEGATIVE
RBC, UA: NEGATIVE
Specific Gravity, UA: 1.01 (ref 1.005–1.030)
Urobilinogen, Ur: 0.2 mg/dL (ref 0.2–1.0)
pH, UA: 7 (ref 5.0–7.5)

## 2022-06-25 MED ORDER — SOLIFENACIN SUCCINATE 10 MG PO TABS: 10.0000 mg | ORAL_TABLET | Freq: Every day | ORAL | 2 refills | Status: DC

## 2022-06-25 NOTE — Progress Notes (Signed)
BP 138/70   Pulse 60   Ht 4\' 11"  (1.499 m)   Wt 133 lb (60.3 kg)   SpO2 100%   BMI 26.86 kg/m    Subjective:   Patient ID: Heidi Dorsey, female    DOB: 02-12-1931, 89 y.o.   MRN: [id number]  HPI: Heidi Dorsey is a 89 y.o. female presenting on 06/25/2022 for Urinary Tract Infection (Believes a nerve may be causing bladder to leak)   HPI Patient is coming in today with complaints of urinary symptoms that wake her up at night.  States that been going on for more than a month where she will wake up with urgency and feels like she has to go and only urinate small amounts.  Denies any fevers or chills or blood in her urine.  She says she really only struggles with that at nighttime and not as much during the day.  She denies any fevers or chills.  She denies vaginal symptoms.  She denies abdominal pain or flank pain.  She says it has been bothering her a lot because she is just not sleeping at night and not getting good sleep because she has to go multiple times a night and cannot get good sleep.  Relevant past medical, surgical, family and social history reviewed and updated as indicated. Interim medical history since our last visit reviewed. Allergies and medications reviewed and updated.  Review of Systems  Constitutional:  Negative for chills and fever.  Genitourinary:  Positive for frequency and urgency. Negative for difficulty urinating, dysuria, flank pain, hematuria and vaginal discharge.  Skin:  Negative for color change and rash.  Psychiatric/Behavioral:  Positive for sleep disturbance. Negative for agitation and behavioral problems.   All other systems reviewed and are negative.   Per HPI unless specifically indicated above   Allergies as of 06/25/2022       Reactions   Calcitonin (salmon) Other (See Comments)   LEFT BUNDLE BRANCH BLOCK   Estradiol Other (See Comments)   Headache   Fexofenadine Other (See Comments)   Syncope   Miacalcin [calcitonin] Palpitations    Nsaids Other (See Comments)   Stomach ulcer after taking for 3 days in 2009. Negative H. Pylori test.   Diphenhydramine Other (See Comments)   Syncope   Amoxicillin-pot Clavulanate Rash   Able to take plain amoxicillin   Ciprofloxacin Rash   Clarithromycin Other (See Comments)   CAN TAKE AZITHROMYCIN   Codeine Other (See Comments)   Doxepin Hcl Other (See Comments)   Heart arrhythmia   Epinephrine Other (See Comments)   Erythromycin Other (See Comments)   Stomach irritation   Fluticasone Other (See Comments)   Fluticasone Propionate Other (See Comments)   And similar corticoid steroids   Ipratropium Bromide Other (See Comments)   Syncope   Latex Rash   Lisinopril Other (See Comments)   Other Reaction: BRUISING, CANKERS & ARTHRALGIAS   Nitrofurantoin Other (See Comments)   Multiple mild symptoms - would prefer not to take again   Other Other (See Comments)   Paraphenylendiamine/propanol amine/anti-cholinergic compounds   Sulfa Antibiotics Other (See Comments)   Tetracycline Other (See Comments)   Mouth sores after just 2 doses. DOXYCYCLINE is OK per patient        Medication List        Accurate as of June 25, 2022 12:38 PM. If you have any questions, ask your nurse or doctor.          albuterol 108 (90 Base) MCG/ACT  inhaler Commonly known as: VENTOLIN HFA Inhale 2 puffs into the lungs every 6 (six) hours as needed for wheezing or shortness of breath.   Ascorbic Acid 500 MG Caps Take 500 mg by mouth daily.   Biotin 5000 MCG Tabs Take 5,000 mcg by mouth daily.   calcium carbonate 500 MG chewable tablet Commonly known as: TUMS - dosed in mg elemental calcium Chew 1 tablet by mouth as needed for indigestion or heartburn.   diltiazem 30 MG tablet Commonly known as: CARDIZEM Take 1 tablet (30 mg total) by mouth 2 (two) times daily as needed (For a heart rate over 100 bpm).   hydrALAZINE 10 MG tablet Commonly known as: APRESOLINE Take 1 tablet (10 mg total)  by mouth as needed. Take (1) tablet as needed for blood pressure sustained of 170 or greater systolic   losartan 25 MG tablet Commonly known as: COZAAR Take 0.5 tablets (12.5 mg total) by mouth every morning. Take 1/2 tablet with a full 25 mg tablet every morning for a total of 37.5 mg every morning   losartan 25 MG tablet Commonly known as: COZAAR Take 1 tablet (25 mg total) by mouth 2 (two) times daily.   Multi-Vitamin tablet Take 1 tablet by mouth daily.   solifenacin 10 MG tablet Commonly known as: VESICARE Take 1 tablet (10 mg total) by mouth daily. Started by: Fransisca Kaufmann Alakai Macbride, MD   vitamin B-12 100 MCG tablet Commonly known as: CYANOCOBALAMIN Take 100 mcg by mouth daily.         Objective:   BP 138/70   Pulse 60   Ht 4\' 11"  (1.499 m)   Wt 133 lb (60.3 kg)   SpO2 100%   BMI 26.86 kg/m   Wt Readings from Last 3 Encounters:  06/25/22 133 lb (60.3 kg)  06/07/22 132 lb (59.9 kg)  05/20/22 131 lb 3.2 oz (59.5 kg)    Physical Exam Vitals and nursing note reviewed.  Constitutional:      Appearance: Normal appearance. She is normal weight.  Abdominal:     General: Abdomen is flat.     Palpations: Abdomen is soft.     Tenderness: There is no abdominal tenderness.  Neurological:     Mental Status: She is alert.       Assessment & Plan:   Problem List Items Addressed This Visit   None Visit Diagnoses     OAB (overactive bladder)    -  Primary   Relevant Medications   solifenacin (VESICARE) 10 MG tablet   UTI symptoms       Relevant Medications   solifenacin (VESICARE) 10 MG tablet   Other Relevant Orders   Urine Culture   Urinalysis     Based on symptoms and a normal urinalysis, likely OAB and will try Vesicare, follow-up with PCP  Follow up plan: Return if symptoms worsen or fail to improve.  Counseling provided for all of the vaccine components Orders Placed This Encounter  Procedures   Urine Culture   Urinalysis    Caryl Pina,  MD [facility] 06/25/2022, 12:38 PM

## 2022-06-27 LAB — URINE CULTURE: Organism ID, Bacteria: NO GROWTH

## 2022-06-28 NOTE — Telephone Encounter (Signed)
Patient aware and verbalizes understanding - no urinary symptoms

## 2022-07-02 ENCOUNTER — Telehealth: Payer: Self-pay | Admitting: Cardiology

## 2022-07-02 NOTE — Telephone Encounter (Signed)
Spoke with patient and she stated she received a voicemail stating for her to give [place] call but is confused on why. She says she called back to [facility] and she can't seem to see who let voicemail. She states "I'm starting to think its a scam, everything is a scam". In her chart she called pcp to report low blood pressure and they have informed her to contact us. She states she does not remember that and her blood pressure is always high.  I asked her about UTI and she was seen on 06/25/22 and the visit diagnosis was overactive bladder. U/A was normal. She did not start her Vesicare. Informed her that if she was still having symptoms she needs to follow up with her PCP.  Patient stated her brain is foggy because she has not been able to sleep because of the urinary symptoms. I informed her to contact PCP.   Patient seems fluster and confused. Gordy Savers is aware.

## 2022-07-02 NOTE — Telephone Encounter (Signed)
Pt called in stating her PCP told her to call Dr. Percival Spanish. She was a little confused on why, but I found the note from her PCP about her bp being low. Pt doesn't remember calling about it. Her bp today was 180/73 hr65, before meds. She has not taken her bp after her medication today.     Heidi Corner, LPN     [id number]  075-[facility]M AM Note Patient called to report that her blood pressure this morning was 107/69, pulse 66.  She is prescribed Losartan 25 mg take twice daily and is concerned about taking this with her blood pressure being low.  She said Dr. Percival Spanish had told her to take it as 1-1/2 tabs in morning and 1 tab in the evening.  She would like to know if she should change medication, change dose, or what you recommend.   Also, when she was seen on 05/20/22 she was treated for a UTI with Keflex 500 mg TID x 5 days.  She finished her antibiotic but does not feel infection is completely gone.  Has urinary frequency and some dysuria.  She would like to bring by a urine sample to be tested.    Please advise and route to clinical pool.

## 2022-07-07 ENCOUNTER — Other Ambulatory Visit: Payer: Self-pay

## 2022-07-07 ENCOUNTER — Emergency Department (HOSPITAL_COMMUNITY): Payer: Medicare Other

## 2022-07-07 ENCOUNTER — Encounter (HOSPITAL_COMMUNITY): Payer: Self-pay | Admitting: Emergency Medicine

## 2022-07-07 ENCOUNTER — Emergency Department (HOSPITAL_COMMUNITY)
Admission: EM | Admit: 2022-07-07 | Discharge: 2022-07-07 | Discharge status: Against Medical Advice | Payer: Medicare Other | Attending: Emergency Medicine | Admitting: Emergency Medicine

## 2022-07-07 DIAGNOSIS — R55 Syncope and collapse: Secondary | ICD-10-CM | POA: Diagnosis not present

## 2022-07-07 DIAGNOSIS — D649 Anemia, unspecified: Secondary | ICD-10-CM | POA: Diagnosis not present

## 2022-07-07 DIAGNOSIS — I10 Essential (primary) hypertension: Secondary | ICD-10-CM | POA: Insufficient documentation

## 2022-07-07 DIAGNOSIS — Z79899 Other long term (current) drug therapy: Secondary | ICD-10-CM | POA: Insufficient documentation

## 2022-07-07 DIAGNOSIS — Z5329 Procedure and treatment not carried out because of patient's decision for other reasons: Secondary | ICD-10-CM | POA: Insufficient documentation

## 2022-07-07 DIAGNOSIS — E860 Dehydration: Secondary | ICD-10-CM | POA: Diagnosis not present

## 2022-07-07 DIAGNOSIS — Z91040 Latex allergy status: Secondary | ICD-10-CM | POA: Insufficient documentation

## 2022-07-07 DIAGNOSIS — E871 Hypo-osmolality and hyponatremia: Secondary | ICD-10-CM | POA: Insufficient documentation

## 2022-07-07 LAB — DIFFERENTIAL
Abs Immature Granulocytes: 0.03 10*3/uL (ref 0.00–0.07)
Basophils Absolute: 0 10*3/uL (ref 0.0–0.1)
Basophils Relative: 1 %
Eosinophils Absolute: 0.1 10*3/uL (ref 0.0–0.5)
Eosinophils Relative: 2 %
Immature Granulocytes: 1 %
Lymphocytes Relative: 13 %
Lymphs Abs: 0.8 10*3/uL (ref 0.7–4.0)
Monocytes Absolute: 0.9 10*3/uL (ref 0.1–1.0)
Monocytes Relative: 14 %
Neutro Abs: 4.3 10*3/uL (ref 1.7–7.7)
Neutrophils Relative %: 69 %

## 2022-07-07 LAB — BASIC METABOLIC PANEL
Anion gap: 8 (ref 5–15)
BUN: 21 mg/dL (ref 8–23)
CO2: 25 mmol/L (ref 22–32)
Calcium: 8.7 mg/dL — ABNORMAL LOW (ref 8.9–10.3)
Chloride: 99 mmol/L (ref 98–111)
Creatinine, Ser: 0.85 mg/dL (ref 0.44–1.00)
GFR, Estimated: >60 mL/min (ref >60)
Glucose, Bld: 114 mg/dL — ABNORMAL HIGH (ref 70–99)
Potassium: 3.7 mmol/L (ref 3.5–5.1)
Sodium: 132 mmol/L — ABNORMAL LOW (ref 135–145)

## 2022-07-07 LAB — CBC
HCT: 36.4 % (ref 36.0–46.0)
Hemoglobin: 11.9 g/dL — ABNORMAL LOW (ref 12.0–15.0)
MCH: 29.8 pg (ref 26.0–34.0)
MCHC: 32.7 g/dL (ref 30.0–36.0)
MCV: 91 fL (ref 80.0–100.0)
Platelets: 179 10*3/uL (ref 150–400)
RBC: 4 MIL/uL (ref 3.87–5.11)
RDW: 15.2 % (ref 11.5–15.5)
WBC: 6.1 10*3/uL (ref 4.0–10.5)
nRBC: 0 % (ref 0.0–0.2)

## 2022-07-07 LAB — TROPONIN I (HIGH SENSITIVITY)
Troponin I (High Sensitivity): 8 ng/L (ref <18)
Troponin I (High Sensitivity): 8 ng/L (ref <18)

## 2022-07-07 NOTE — ED Provider Notes (Signed)
[facility] Provider Note   CSN: [id number] Arrival date & time: 07/07/22  1948     History {Add pertinent medical, surgical, social history, OB history to HPI:1} Chief Complaint  Patient presents with   Loss of Consciousness    Heidi Dorsey is a 89 y.o. female.  Patient has a history of hypertension.  She was opening a jar and passed out.   Loss of Consciousness      Home Medications Prior to Admission medications   Medication Sig Start Date End Date Taking? Authorizing Provider  albuterol (VENTOLIN HFA) 108 (90 Base) MCG/ACT inhaler Inhale 2 puffs into the lungs every 6 (six) hours as needed for wheezing or shortness of breath. 02/17/22   Baruch Gouty, FNP  Ascorbic Acid 500 MG CAPS Take 500 mg by mouth daily.    [provider]  Biotin 5000 MCG TABS Take 5,000 mcg by mouth daily.    [provider]  calcium carbonate (TUMS - DOSED IN MG ELEMENTAL CALCIUM) 500 MG chewable tablet Chew 1 tablet by mouth as needed for indigestion or heartburn.    [provider]  diltiazem (CARDIZEM) 30 MG tablet Take 1 tablet (30 mg total) by mouth 2 (two) times daily as needed (For a heart rate over 100 bpm). 02/02/22   Minus Breeding, MD  hydrALAZINE (APRESOLINE) 10 MG tablet Take 1 tablet (10 mg total) by mouth as needed. Take (1) tablet as needed for blood pressure sustained of 170 or greater systolic [id number]   Minus Breeding, MD  losartan (COZAAR) 25 MG tablet Take 0.5 tablets (12.5 mg total) by mouth every morning. Take 1/2 tablet with a full 25 mg tablet every morning for a total of 37.5 mg every morning 01/13/22   Minus Breeding, MD  losartan (COZAAR) 25 MG tablet Take 1 tablet (25 mg total) by mouth 2 (two) times daily. 05/20/22   Baruch Gouty, FNP  Multiple Vitamin (MULTI-VITAMIN) tablet Take 1 tablet by mouth daily.    [provider]  solifenacin (VESICARE) 10 MG tablet Take 1 tablet (10 mg total) by  mouth daily. 06/25/22   Dettinger, Fransisca Kaufmann, MD  vitamin B-12 (CYANOCOBALAMIN) 100 MCG tablet Take 100 mcg by mouth daily.    [provider]      Allergies    Calcitonin (salmon), Estradiol, Fexofenadine, Miacalcin [calcitonin], Nsaids, Diphenhydramine, Amoxicillin-pot clavulanate, Ciprofloxacin, Clarithromycin, Codeine, Doxepin hcl, Epinephrine, Erythromycin, Fluticasone, Fluticasone propionate, Ipratropium bromide, Latex, Lisinopril, Nitrofurantoin, Other, Sulfa antibiotics, and Tetracycline    Review of Systems   Review of Systems  Cardiovascular:  Positive for syncope.    Physical Exam Updated Vital Signs BP (!) 134/49   Pulse 69   Temp 97.7 F (36.5 C) (Oral)   Resp 17   Ht 4\' 11"  (1.499 m)   Wt 60.3 kg   SpO2 99%   BMI 26.86 kg/m  Physical Exam  ED Results / Procedures / Treatments   Labs (all labs ordered are listed, but only abnormal results are displayed) Labs Reviewed  BASIC METABOLIC PANEL - Abnormal; Notable for the following components:      Result Value   Sodium 132 (*)    Glucose, Bld 114 (*)    Calcium 8.7 (*)    All other components within normal limits  CBC - Abnormal; Notable for the following components:   Hemoglobin 11.9 (*)    All other components within normal limits  DIFFERENTIAL  TROPONIN I (HIGH SENSITIVITY)  TROPONIN I (HIGH SENSITIVITY)    EKG EKG Interpretation  Date/Time:  Wednesday July 07 2022 19:59:07 EDT Ventricular Rate:  80 PR Interval:  177 QRS Duration: 133 QT Interval:  391 QTC Calculation: 391 R Axis:   80 Text Interpretation: Sinus rhythm Ventricular bigeminy Probable LVH with secondary repol abnrm ST depr, consider ischemia, inferior leads Anterior ST elevation, probably due to LVH Confirmed by Milton Ferguson [id number]) on 07/07/2022 10:10:35 PM  Radiology CT Head Wo Contrast  Result Date: 07/07/2022 CLINICAL DATA:  Head trauma, intracranial arterial injury suspected; Ataxia, cervical trauma. Pt states she was  trying to open a jar when she had a sharpe pain in her chest and syncopal episode. Pt states she woke up in the floor and noticed a knot on the left side of her head. EXAM: CT HEAD WITHOUT CONTRAST CT CERVICAL SPINE WITHOUT CONTRAST TECHNIQUE: Multidetector CT imaging of the head and cervical spine was performed following the standard protocol without intravenous contrast. Multiplanar CT image reconstructions of the cervical spine were also generated. RADIATION DOSE REDUCTION: This exam was performed according to the departmental dose-optimization program which includes automated exposure control, adjustment of the mA and/or kV according to patient size and/or use of iterative reconstruction technique. COMPARISON:  None Available. FINDINGS: CT HEAD FINDINGS Brain: Cerebral ventricle sizes are concordant with the degree of age-appropriate cerebral volume loss. trace patchy and confluent areas of decreased attenuation are noted throughout the deep and periventricular white matter of the cerebral hemispheres bilaterally, compatible with chronic microvascular ischemic disease. No evidence of large-territorial acute infarction. No parenchymal hemorrhage. No mass lesion. No extra-axial collection. No mass effect or midline shift. No hydrocephalus. Basilar cisterns are patent. Vascular: No hyperdense vessel. Atherosclerotic calcifications are present within the cavernous internal carotid and vertebral arteries. Skull: No acute fracture or focal lesion. Sinuses/Orbits: Paranasal sinuses and mastoid air cells are clear. The orbits are unremarkable. Other: Trace left scalp 3 mm hematoma. CT CERVICAL SPINE FINDINGS Alignment: Grade 1 anterolisthesis of C2 on C3 and C3 on C4. Mild retrolisthesis of C5 on C6. Skull base and vertebrae: Multilevel moderate severe degenerative changes spine with associated multilevel severe osseous neural foraminal stenosis. No severe osseous central canal stenosis. No acute fracture. No aggressive  appearing focal osseous lesion or focal pathologic process. Soft tissues and spinal canal: No prevertebral fluid or swelling. No visible canal hematoma. Upper chest: Unremarkable. Other: Atherosclerotic plaque of the carotid arteries in the neck. IMPRESSION: 1. No acute intracranial abnormality. 2. No acute displaced fracture or traumatic listhesis of the cervical spine. 3. Multilevel moderate severe degenerative changes spine with associated multilevel severe osseous neural foraminal stenosis. Electronically Signed   By: Iven Finn M.D.   On: 07/07/2022 21:56   CT Cervical Spine Wo Contrast  Result Date: 07/07/2022 CLINICAL DATA:  Head trauma, intracranial arterial injury suspected; Ataxia, cervical trauma. Pt states she was trying to open a jar when she had a sharpe pain in her chest and syncopal episode. Pt states she woke up in the floor and noticed a knot on the left side of her head. EXAM: CT HEAD WITHOUT CONTRAST CT CERVICAL SPINE WITHOUT CONTRAST TECHNIQUE: Multidetector CT imaging of the head and cervical spine was performed following the standard protocol without intravenous contrast. Multiplanar CT image reconstructions of the cervical spine were also generated. RADIATION DOSE REDUCTION: This exam was performed according to the departmental dose-optimization program which includes automated exposure control, adjustment of the mA and/or kV according to patient size and/or use  of iterative reconstruction technique. COMPARISON:  None Available. FINDINGS: CT HEAD FINDINGS Brain: Cerebral ventricle sizes are concordant with the degree of age-appropriate cerebral volume loss. trace patchy and confluent areas of decreased attenuation are noted throughout the deep and periventricular white matter of the cerebral hemispheres bilaterally, compatible with chronic microvascular ischemic disease. No evidence of large-territorial acute infarction. No parenchymal hemorrhage. No mass lesion. No extra-axial  collection. No mass effect or midline shift. No hydrocephalus. Basilar cisterns are patent. Vascular: No hyperdense vessel. Atherosclerotic calcifications are present within the cavernous internal carotid and vertebral arteries. Skull: No acute fracture or focal lesion. Sinuses/Orbits: Paranasal sinuses and mastoid air cells are clear. The orbits are unremarkable. Other: Trace left scalp 3 mm hematoma. CT CERVICAL SPINE FINDINGS Alignment: Grade 1 anterolisthesis of C2 on C3 and C3 on C4. Mild retrolisthesis of C5 on C6. Skull base and vertebrae: Multilevel moderate severe degenerative changes spine with associated multilevel severe osseous neural foraminal stenosis. No severe osseous central canal stenosis. No acute fracture. No aggressive appearing focal osseous lesion or focal pathologic process. Soft tissues and spinal canal: No prevertebral fluid or swelling. No visible canal hematoma. Upper chest: Unremarkable. Other: Atherosclerotic plaque of the carotid arteries in the neck. IMPRESSION: 1. No acute intracranial abnormality. 2. No acute displaced fracture or traumatic listhesis of the cervical spine. 3. Multilevel moderate severe degenerative changes spine with associated multilevel severe osseous neural foraminal stenosis. Electronically Signed   By: Iven Finn M.D.   On: 07/07/2022 21:56   DG Chest Port 1 View  Result Date: 07/07/2022 CLINICAL DATA:  Chest pain and shortness of breath EXAM: PORTABLE CHEST 1 VIEW COMPARISON:  03/11/2022 FINDINGS: Cardiac shadow is at the upper limits of normal in size. Aortic calcifications are seen. The lungs are well aerated bilaterally. Mild interstitial changes are seen likely of a chronic nature. No focal infiltrate is noted. No bony abnormality is seen. IMPRESSION: No active disease. Electronically Signed   By: Inez Catalina M.D.   On: 07/07/2022 21:06    Procedures Procedures  {Document cardiac monitor, telemetry assessment procedure when  appropriate:1}  Medications Ordered in ED Medications - No data to display  ED Course/ Medical Decision Making/ A&P   {   Click here for ABCD2, HEART and other calculatorsREFRESH Note before signing :1}                          Medical Decision Making Amount and/or Complexity of Data Reviewed Labs: ordered. Radiology: ordered.   Patient with syncope.  CT of head unremarkable for first troponin and CBC and chemistries were unremarkable.  Patient had some PVCs.  Patient left AMA before her second troponin with back  {Document critical care time when appropriate:1} {Document review of labs and clinical decision tools ie heart score, Chads2Vasc2 etc:1}  {Document your independent review of radiology images, and any outside records:1} {Document your discussion with family members, caretakers, and with consultants:1} {Document social determinants of health affecting pt's care:1} {Document your decision making why or why not admission, treatments were needed:1} Final Clinical Impression(s) / ED Diagnoses Final diagnoses:  None    Rx / DC Orders ED Discharge Orders     None

## 2022-07-07 NOTE — ED Triage Notes (Signed)
Pt BIB [facility] from home. Pt states she was trying to open a jar when she had a sharpe pain in her chest and syncopal episode. Pt states she woke up in the floor and noticed a knot on the left side of her head. Pt denies taking blood thinners.

## 2022-07-13 ENCOUNTER — Telehealth: Payer: Self-pay | Admitting: *Deleted

## 2022-07-13 NOTE — Telephone Encounter (Signed)
     Patient  visit on 07/07/2022  at [facility]  was for tratment  patient did not want to talk afraid it was a scam call    [facility] [address] , [place] [address] Email : [email or web address]

## 2022-07-21 ENCOUNTER — Ambulatory Visit: Payer: Commercial Managed Care - PPO | Admitting: Cardiology

## 2022-08-18 ENCOUNTER — Ambulatory Visit: Payer: Commercial Managed Care - PPO | Admitting: Cardiology

## 2022-09-07 ENCOUNTER — Telehealth: Payer: Self-pay | Admitting: Cardiology

## 2022-09-07 MED ORDER — LOSARTAN POTASSIUM 25 MG PO TABS: ORAL_TABLET | ORAL | 3 refills | Status: DC

## 2022-09-07 NOTE — Telephone Encounter (Signed)
Pt c/o medication issue:  1. Name of Medication:   losartan (COZAAR) 25 MG tablet    2. How are you currently taking this medication (dosage and times per day)?   3. Are you having a reaction (difficulty breathing--STAT)?   4. What is your medication issue? Pharmacy is requesting call back to get clarification on this medication and how it is to be taken.

## 2022-09-07 NOTE — Telephone Encounter (Signed)
Spoke with pharmacy.  Based on March 22 OV, patient is taking medications as Losartan 25 mg  1 1/2 tablets in the morning and 1 tablet in the evening. Prescription sent to pharmacy with updated directions.

## 2022-09-20 ENCOUNTER — Emergency Department (HOSPITAL_COMMUNITY)
Admission: EM | Admit: 2022-09-20 | Discharge: 2022-09-21 | Disposition: A | Discharge status: Home / Self Care | Payer: Medicare Other | Attending: Emergency Medicine | Admitting: Emergency Medicine

## 2022-09-20 ENCOUNTER — Telehealth: Payer: Self-pay | Admitting: Cardiology

## 2022-09-20 ENCOUNTER — Other Ambulatory Visit: Payer: Self-pay

## 2022-09-20 DIAGNOSIS — Z91040 Latex allergy status: Secondary | ICD-10-CM | POA: Insufficient documentation

## 2022-09-20 DIAGNOSIS — I10 Essential (primary) hypertension: Secondary | ICD-10-CM | POA: Insufficient documentation

## 2022-09-20 DIAGNOSIS — Z79899 Other long term (current) drug therapy: Secondary | ICD-10-CM | POA: Diagnosis not present

## 2022-09-20 MED ORDER — LOSARTAN POTASSIUM 25 MG PO TABS
25.0000 mg | ORAL_TABLET | Freq: Once | ORAL | Status: AC
  Administered 2022-09-20: 25 mg via ORAL
  Filled 2022-09-20: qty 1

## 2022-09-20 NOTE — Telephone Encounter (Signed)
Pt called with elevated BP, being off balance and difficulty ambulating.  She lives alone. EMS had been there.  She is concerned enough to feel eval in hospital is warranted.   Concern with balance issues and uncontrolled HTN.  She will call EMS back and come to the hospital.

## 2022-09-20 NOTE — Discharge Instructions (Signed)
Follow-up with your doctors if you continue to have problems with your blood pressure

## 2022-09-20 NOTE — ED Triage Notes (Signed)
BIB by EMS for hypertension.  180/80s.  RAC IV in place.  Has been compliant with meds.  Called her doctor who advised eval at the ED.

## 2022-09-20 NOTE — ED Provider Notes (Signed)
[place] EMERGENCY DEPARTMENT AT [facility] Provider Note   CSN: [id number] Arrival date & time: 09/20/22  1815     History {Add pertinent medical, surgical, social history, OB history to HPI:1} Chief Complaint  Patient presents with   Hypertension    Heidi Dorsey is a 89 y.o. female.  Patient has a history of hypertension.  She states her blood pressure was 180 systolic and she wanted to be checked.   Hypertension       Home Medications Prior to Admission medications   Medication Sig Start Date End Date Taking? Authorizing Provider  albuterol (VENTOLIN HFA) 108 (90 Base) MCG/ACT inhaler Inhale 2 puffs into the lungs every 6 (six) hours as needed for wheezing or shortness of breath. 02/17/22   Sonny Masters, FNP  Ascorbic Acid 500 MG CAPS Take 500 mg by mouth daily.    [provider]  Biotin 5000 MCG TABS Take 5,000 mcg by mouth daily.    [provider]  calcium carbonate (TUMS - DOSED IN MG ELEMENTAL CALCIUM) 500 MG chewable tablet Chew 1 tablet by mouth as needed for indigestion or heartburn.    [provider]  diltiazem (CARDIZEM) 30 MG tablet Take 1 tablet (30 mg total) by mouth 2 (two) times daily as needed (For a heart rate over 100 bpm). 02/02/22   Rollene Rotunda, MD  hydrALAZINE (APRESOLINE) 10 MG tablet Take 1 tablet (10 mg total) by mouth as needed. Take (1) tablet as needed for blood pressure sustained of 170 or greater systolic 02/18/22   Rollene Rotunda, MD  losartan (COZAAR) 25 MG tablet Take One and half tablets (37.5 mg) in the morning and One tablet (25 mg)in the evening. 09/07/22   Rollene Rotunda, MD  Multiple Vitamin (MULTI-VITAMIN) tablet Take 1 tablet by mouth daily.    [provider]  solifenacin (VESICARE) 10 MG tablet Take 1 tablet (10 mg total) by mouth daily. 06/25/22   Dettinger, Elige Radon, MD  vitamin B-12 (CYANOCOBALAMIN) 100 MCG tablet Take 100 mcg by mouth daily.    [provider]       Allergies    Calcitonin (salmon), Estradiol, Fexofenadine, Miacalcin [calcitonin], Nsaids, Diphenhydramine, Amoxicillin-pot clavulanate, Ciprofloxacin, Clarithromycin, Codeine, Doxepin hcl, Epinephrine, Erythromycin, Fluticasone, Fluticasone propionate, Ipratropium bromide, Latex, Lisinopril, Nitrofurantoin, Other, Sulfa antibiotics, and Tetracycline    Review of Systems   Review of Systems  Physical Exam Updated Vital Signs BP (!) 173/64   Pulse 65   Temp 98 F (36.7 C) (Oral)   Resp 15   SpO2 97%  Physical Exam  ED Results / Procedures / Treatments   Labs (all labs ordered are listed, but only abnormal results are displayed) Labs Reviewed - No data to display  EKG EKG Interpretation  Date/Time:  Monday September 20 2022 21:26:46 EDT Ventricular Rate:  60 PR Interval:  181 QRS Duration: 148 QT Interval:  465 QTC Calculation: 465 R Axis:   39 Text Interpretation: Sinus rhythm Left bundle branch block Baseline wander in lead(s) V4 Confirmed by Bethann Berkshire [phone]) on 09/20/2022 9:55:59 PM  Radiology No results found.  Procedures Procedures  {Document cardiac monitor, telemetry assessment procedure when appropriate:1}  Medications Ordered in ED Medications  losartan (COZAAR) tablet 25 mg (has no administration in time range)    ED Course/ Medical Decision Making/ A&P   {   Click here for ABCD2, HEART and other calculatorsREFRESH Note before signing :1}  Medical Decision Making Amount and/or Complexity of Data Reviewed ECG/medicine tests: ordered.  Risk Prescription drug management.   Mildly controlled blood pressure.  She will be discharged home after giving her the evening dose of losartan  {Document critical care time when appropriate:1} {Document review of labs and clinical decision tools ie heart score, Chads2Vasc2 etc:1}  {Document your independent review of radiology images, and any outside records:1} {Document your discussion  with family members, caretakers, and with consultants:1} {Document social determinants of health affecting pt's care:1} {Document your decision making why or why not admission, treatments were needed:1} Final Clinical Impression(s) / ED Diagnoses Final diagnoses:  Hypertension, unspecified type    Rx / DC Orders ED Discharge Orders     None

## 2022-09-20 NOTE — ED Notes (Signed)
This RN attempted to reach grandson but received voicemail. Patient left message. She refuses to left staff call her daughter because "she tends to raise her temper and my blood pressure". Heidi Dorsey

## 2022-09-26 DIAGNOSIS — I480 Paroxysmal atrial fibrillation: Secondary | ICD-10-CM | POA: Insufficient documentation

## 2022-09-26 NOTE — Progress Notes (Signed)
Cardiology Office Note:   Date:  09/29/2022  ID:  Silas, Michiels Dec 26, 1930, MRN [id number] PCP: Sonny Masters, FNP  [facility] Providers Cardiologist:  None     History of Present Illness:   Heidi Dorsey is a 89 y.o. female who presents for evaluation of palpitations.    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  She had a long history of left bundle branch block.  She has had a cardiac catheterization in 2000.  She does not remember the results but was not told that she had vascular disease.  I see that she has had carotid Dopplers which were unremarkable.  She had a very mildly reduced ejection fraction on the stress echo in 2017 with her EF being 50%.  She has had a 24-hour Holter.  She has been treated with many different BP meds and has had symptoms with multiple BP strategies.    PRN hydralazine seemed to work.   She has been in the ED twice since I last saw her for BP.   I reviewed these records for this visit.  There was a report of syncope in March but no obvious findings on ED evaluation.  She was in the ED again a couple of days ago with her BP but I don't see that any med changes were suggested.     She denies any acute symptoms.       She previously has been treated with metoprolol.  Most recently she was on Cozaar 12.5 mg daily but this was discontinued because her blood pressures were running low.   She also called back and she was had an eye bleed on ARB so she did not want to start this.  She has also been intolerant of amlodipine.  She cannot take ACE inhibitors.  She seemed to be tolerating beta blocker so at the last visit I was prescribing this.  I had suggested hydralazine PRN SBP greater than 160.  She called back because she did not want to take the hydralazine because she read it would worsen her asthma.    When we spoke to her in June she was taking Losartan in the AM and metoprolol in the evening.  She was back to taking  hydralazine but because a pharmacist told her it did not make sense to take it PRN she was taking it daily.  She did not want to be seen in the HTN clinic.  I tried to schedule her to see me in [place] as there were no appts in [place] and she would not do this.  Her blood pressure has been labile. She was found to have PAF .  Since I last saw her she was again in the ED.   she was having no new shortness of breath, PND or orthopnea.  She has had no palpitations, presyncope or syncope.  She denies any chest pressure, neck or arm discomfort.  She has no weight gain or edema.   ROS: As stated in the HPI and negative for all other systems.  Studies Reviewed:    EKG:  NA   Risk Assessment/Calculations:    CHA2DS2-VASc Score = 4   This indicates a 4.8% annual risk of stroke. The patient's score is based upon: CHF History: 0 HTN History: 1 Diabetes History: 0 Stroke History: 0 Vascular Disease History: 0 Age Score: 2 Gender Score: 1   Physical Exam:   VS:  BP (!) 162/68  Pulse 60   Ht 4\' 11"  (1.499 m)   Wt 130 lb (59 kg)   BMI 26.26 kg/m    Wt Readings from Last 3 Encounters:  09/29/22 130 lb (59 kg)  07/07/22 133 lb (60.3 kg)  06/25/22 133 lb (60.3 kg)     GEN: Well nourished, well developed in no acute distress NECK: No JVD; No carotid bruits CARDIAC: RRR, 2 out of 6 apical systolic murmur radiating slightly at the aortic outflow tract, no diastolic murmurs, rubs, gallops RESPIRATORY:  Clear to auscultation without rales, wheezing or rhonchi  ABDOMEN: Soft, non-tender, non-distended EXTREMITIES:  No edema; No deformity   ASSESSMENT AND PLAN:   ATRIAL FIB:   She has had no symptomatic atrial fibrillation.  She would not want to consider a Watchman.  She be too high risk for anticoagulation at this point.  No change in therapy.   HTN:     The blood pressure is labile.  However, she been very sensitive to medications and doing relatively well on a regimen as listed.   ER  note reviewed for this visit.   MURMUR:   We did order an echocardiogram but she could not get transportation and did not want to do this.  I will follow this clinically.   Follow up me in one year.   Signed, Rollene Rotunda, MD

## 2022-09-27 ENCOUNTER — Telehealth: Payer: Self-pay

## 2022-09-27 NOTE — Telephone Encounter (Signed)
Transition Care Management Follow-up Telephone Call Date of discharge and from where: [facility] How have you been since you were released from the hospital? Patient feels like she had a stroke and didn't get any answers from [facility]. Pt does not have a PCP AND DECLINED THE REST OF THE CALL  Any questions or concerns? Yes  Items Reviewed: Did the pt receive and understand the discharge instructions provided?  Medications obtained and verified?  Other?  Any new allergies since your discharge?  Dietary orders reviewed?  Do you have support at home?     Follow up appointments reviewed:  PCP Hospital f/u appt confirmed?  Scheduled to see  on  @ . Specialist Hospital f/u appt confirmed?  Scheduled to see  on  @ . Are transportation arrangements needed?  If their condition worsens, is the pt aware to call PCP or go to the Emergency Dept.?  Was the patient provided with contact information for the PCP's office or ED?  Was to pt encouraged to call back with questions or concerns?

## 2022-09-29 ENCOUNTER — Ambulatory Visit (INDEPENDENT_AMBULATORY_CARE_PROVIDER_SITE_OTHER): Payer: Medicare Other | Admitting: Cardiology

## 2022-09-29 ENCOUNTER — Ambulatory Visit: Payer: Commercial Managed Care - PPO | Admitting: Cardiology

## 2022-09-29 ENCOUNTER — Encounter: Payer: Self-pay | Admitting: Cardiology

## 2022-09-29 VITALS — BP 162/68 | HR 60 | Ht 59.0 in | Wt 130.0 lb

## 2022-09-29 DIAGNOSIS — I10 Essential (primary) hypertension: Secondary | ICD-10-CM

## 2022-09-29 DIAGNOSIS — I480 Paroxysmal atrial fibrillation: Secondary | ICD-10-CM

## 2022-09-29 NOTE — Patient Instructions (Signed)
Medication Instructions:  The current medical regimen is effective;  continue present plan and medications.  *If you need a refill on your cardiac medications before your next appointment, please call your pharmacy*  Follow-Up: At [facility], you and your health needs are our priority.  As part of our continuing mission to provide you with exceptional heart care, we have created designated Provider Care Teams.  These Care Teams include your primary Cardiologist (physician) and Advanced Practice Providers (APPs -  Physician Assistants and Nurse Practitioners) who all work together to provide you with the care you need, when you need it.  We recommend signing up for the patient portal called "MyChart".  Sign up information is provided on this After Visit Summary.  MyChart is used to connect with patients for Virtual Visits (Telemedicine).  Patients are able to view lab/test results, encounter notes, upcoming appointments, etc.  Non-urgent messages can be sent to your provider as well.   To learn more about what you can do with MyChart, go to [email or web address].    Your next appointment:   1 year(s)  Provider:   James Hochrein, MD     

## 2022-10-17 ENCOUNTER — Other Ambulatory Visit: Payer: Self-pay

## 2022-10-17 ENCOUNTER — Emergency Department (HOSPITAL_COMMUNITY): Payer: Medicare Other

## 2022-10-17 ENCOUNTER — Encounter (HOSPITAL_COMMUNITY): Payer: Self-pay | Admitting: Emergency Medicine

## 2022-10-17 ENCOUNTER — Inpatient Hospital Stay (HOSPITAL_COMMUNITY)
Admission: EM | Admit: 2022-10-17 | Discharge: 2022-10-20 | DRG: 309 | Disposition: A | Discharge status: Home Health | Payer: Medicare Other | Attending: Internal Medicine | Admitting: Internal Medicine

## 2022-10-17 DIAGNOSIS — Z886 Allergy status to analgesic agent status: Secondary | ICD-10-CM

## 2022-10-17 DIAGNOSIS — Z8262 Family history of osteoporosis: Secondary | ICD-10-CM

## 2022-10-17 DIAGNOSIS — Z808 Family history of malignant neoplasm of other organs or systems: Secondary | ICD-10-CM

## 2022-10-17 DIAGNOSIS — Z881 Allergy status to other antibiotic agents status: Secondary | ICD-10-CM

## 2022-10-17 DIAGNOSIS — Z90710 Acquired absence of both cervix and uterus: Secondary | ICD-10-CM

## 2022-10-17 DIAGNOSIS — Z8049 Family history of malignant neoplasm of other genital organs: Secondary | ICD-10-CM

## 2022-10-17 DIAGNOSIS — Z79899 Other long term (current) drug therapy: Secondary | ICD-10-CM | POA: Diagnosis not present

## 2022-10-17 DIAGNOSIS — I7300 Raynaud's syndrome without gangrene: Secondary | ICD-10-CM | POA: Diagnosis not present

## 2022-10-17 DIAGNOSIS — E785 Hyperlipidemia, unspecified: Secondary | ICD-10-CM | POA: Diagnosis present

## 2022-10-17 DIAGNOSIS — I483 Typical atrial flutter: Secondary | ICD-10-CM | POA: Diagnosis present

## 2022-10-17 DIAGNOSIS — Z800 Family history of malignant neoplasm of digestive organs: Secondary | ICD-10-CM

## 2022-10-17 DIAGNOSIS — I480 Paroxysmal atrial fibrillation: Secondary | ICD-10-CM | POA: Diagnosis not present

## 2022-10-17 DIAGNOSIS — H8110 Benign paroxysmal vertigo, unspecified ear: Secondary | ICD-10-CM | POA: Diagnosis present

## 2022-10-17 DIAGNOSIS — Z885 Allergy status to narcotic agent status: Secondary | ICD-10-CM

## 2022-10-17 DIAGNOSIS — I447 Left bundle-branch block, unspecified: Secondary | ICD-10-CM | POA: Diagnosis present

## 2022-10-17 DIAGNOSIS — Z833 Family history of diabetes mellitus: Secondary | ICD-10-CM

## 2022-10-17 DIAGNOSIS — I4892 Unspecified atrial flutter: Secondary | ICD-10-CM | POA: Diagnosis present

## 2022-10-17 DIAGNOSIS — M8580 Other specified disorders of bone density and structure, unspecified site: Secondary | ICD-10-CM | POA: Diagnosis present

## 2022-10-17 DIAGNOSIS — Z8052 Family history of malignant neoplasm of bladder: Secondary | ICD-10-CM | POA: Diagnosis not present

## 2022-10-17 DIAGNOSIS — Z8041 Family history of malignant neoplasm of ovary: Secondary | ICD-10-CM

## 2022-10-17 DIAGNOSIS — I10 Essential (primary) hypertension: Secondary | ICD-10-CM | POA: Diagnosis present

## 2022-10-17 DIAGNOSIS — I4891 Unspecified atrial fibrillation: Principal | ICD-10-CM

## 2022-10-17 DIAGNOSIS — I495 Sick sinus syndrome: Secondary | ICD-10-CM | POA: Diagnosis present

## 2022-10-17 DIAGNOSIS — K219 Gastro-esophageal reflux disease without esophagitis: Secondary | ICD-10-CM | POA: Diagnosis present

## 2022-10-17 DIAGNOSIS — J45909 Unspecified asthma, uncomplicated: Secondary | ICD-10-CM | POA: Diagnosis present

## 2022-10-17 DIAGNOSIS — Z91040 Latex allergy status: Secondary | ICD-10-CM

## 2022-10-17 DIAGNOSIS — F05 Delirium due to known physiological condition: Secondary | ICD-10-CM | POA: Diagnosis present

## 2022-10-17 DIAGNOSIS — R4189 Other symptoms and signs involving cognitive functions and awareness: Secondary | ICD-10-CM | POA: Diagnosis not present

## 2022-10-17 DIAGNOSIS — Z8711 Personal history of peptic ulcer disease: Secondary | ICD-10-CM | POA: Diagnosis not present

## 2022-10-17 DIAGNOSIS — Z87442 Personal history of urinary calculi: Secondary | ICD-10-CM

## 2022-10-17 DIAGNOSIS — G4730 Sleep apnea, unspecified: Secondary | ICD-10-CM | POA: Diagnosis present

## 2022-10-17 DIAGNOSIS — Z880 Allergy status to penicillin: Secondary | ICD-10-CM

## 2022-10-17 DIAGNOSIS — Z888 Allergy status to other drugs, medicaments and biological substances status: Secondary | ICD-10-CM

## 2022-10-17 DIAGNOSIS — Z66 Do not resuscitate: Secondary | ICD-10-CM | POA: Diagnosis present

## 2022-10-17 DIAGNOSIS — Z882 Allergy status to sulfonamides status: Secondary | ICD-10-CM

## 2022-10-17 DIAGNOSIS — E871 Hypo-osmolality and hyponatremia: Secondary | ICD-10-CM | POA: Diagnosis present

## 2022-10-17 DIAGNOSIS — M1990 Unspecified osteoarthritis, unspecified site: Secondary | ICD-10-CM | POA: Diagnosis present

## 2022-10-17 LAB — CBC WITH DIFFERENTIAL/PLATELET
Abs Immature Granulocytes: 0.03 10*3/uL (ref 0.00–0.07)
Basophils Absolute: 0 10*3/uL (ref 0.0–0.1)
Basophils Relative: 1 %
Eosinophils Absolute: 0.1 10*3/uL (ref 0.0–0.5)
Eosinophils Relative: 1 %
HCT: 35.6 % — ABNORMAL LOW (ref 36.0–46.0)
Hemoglobin: 12 g/dL (ref 12.0–15.0)
Immature Granulocytes: 1 %
Lymphocytes Relative: 25 %
Lymphs Abs: 1.5 10*3/uL (ref 0.7–4.0)
MCH: 29.6 pg (ref 26.0–34.0)
MCHC: 33.7 g/dL (ref 30.0–36.0)
MCV: 87.9 fL (ref 80.0–100.0)
Monocytes Absolute: 0.6 10*3/uL (ref 0.1–1.0)
Monocytes Relative: 9 %
Neutro Abs: 3.9 10*3/uL (ref 1.7–7.7)
Neutrophils Relative %: 63 %
Platelets: 189 10*3/uL (ref 150–400)
RBC: 4.05 MIL/uL (ref 3.87–5.11)
RDW: 14.2 % (ref 11.5–15.5)
WBC: 6.1 10*3/uL (ref 4.0–10.5)
nRBC: 0 % (ref 0.0–0.2)

## 2022-10-17 LAB — COMPREHENSIVE METABOLIC PANEL
ALT: 19 U/L (ref 0–44)
AST: 21 U/L (ref 15–41)
Albumin: 3.8 g/dL (ref 3.5–5.0)
Alkaline Phosphatase: 58 U/L (ref 38–126)
Anion gap: 8 (ref 5–15)
BUN: 19 mg/dL (ref 8–23)
CO2: 24 mmol/L (ref 22–32)
Calcium: 8.7 mg/dL — ABNORMAL LOW (ref 8.9–10.3)
Chloride: 91 mmol/L — ABNORMAL LOW (ref 98–111)
Creatinine, Ser: 0.65 mg/dL (ref 0.44–1.00)
GFR, Estimated: >60 mL/min (ref >60)
Glucose, Bld: 104 mg/dL — ABNORMAL HIGH (ref 70–99)
Potassium: 4.2 mmol/L (ref 3.5–5.1)
Sodium: 123 mmol/L — ABNORMAL LOW (ref 135–145)
Total Bilirubin: 0.5 mg/dL (ref 0.3–1.2)
Total Protein: 5.9 g/dL — ABNORMAL LOW (ref 6.5–8.1)

## 2022-10-17 LAB — PROTIME-INR
INR: 1 (ref 0.8–1.2)
Prothrombin Time: 13.4 seconds (ref 11.4–15.2)

## 2022-10-17 LAB — TROPONIN I (HIGH SENSITIVITY): Troponin I (High Sensitivity): 8 ng/L (ref <18)

## 2022-10-17 LAB — BRAIN NATRIURETIC PEPTIDE: B Natriuretic Peptide: 124 pg/mL — ABNORMAL HIGH (ref 0.0–100.0)

## 2022-10-17 LAB — TSH: TSH: 2.171 u[IU]/mL (ref 0.350–4.500)

## 2022-10-17 LAB — MAGNESIUM: Magnesium: 2.1 mg/dL (ref 1.7–2.4)

## 2022-10-17 MED ORDER — POLYETHYLENE GLYCOL 3350 17 G PO PACK: 17.0000 g | PACK | Freq: Every day | ORAL | Status: DC | PRN

## 2022-10-17 MED ORDER — SODIUM CHLORIDE 0.9 % IV SOLN: INTRAVENOUS | Status: DC

## 2022-10-17 MED ORDER — DILTIAZEM LOAD VIA INFUSION
10.0000 mg | Freq: Once | INTRAVENOUS | Status: DC
  Filled 2022-10-17: qty 10

## 2022-10-17 MED ORDER — CHLORHEXIDINE GLUCONATE CLOTH 2 % EX PADS
6.0000 | MEDICATED_PAD | Freq: Every day | CUTANEOUS | Status: DC
  Administered 2022-10-18 – 2022-10-20 (×2): 6 via TOPICAL

## 2022-10-17 MED ORDER — ACETAMINOPHEN 650 MG RE SUPP: 650.0000 mg | Freq: Four times a day (QID) | RECTAL | Status: DC | PRN

## 2022-10-17 MED ORDER — DILTIAZEM HCL-DEXTROSE 125-5 MG/125ML-% IV SOLN (PREMIX)
5.0000 mg/h | INTRAVENOUS | Status: DC
  Administered 2022-10-17: 5 mg/h via INTRAVENOUS
  Filled 2022-10-17: qty 125

## 2022-10-17 MED ORDER — ACETAMINOPHEN 325 MG PO TABS: 650.0000 mg | ORAL_TABLET | Freq: Four times a day (QID) | ORAL | Status: DC | PRN

## 2022-10-17 MED ORDER — METOPROLOL TARTRATE 5 MG/5ML IV SOLN
5.0000 mg | Freq: Once | INTRAVENOUS | Status: AC
  Administered 2022-10-17: 5 mg via INTRAVENOUS
  Filled 2022-10-17: qty 5

## 2022-10-17 MED ORDER — ENOXAPARIN SODIUM 30 MG/0.3ML IJ SOSY
30.0000 mg | PREFILLED_SYRINGE | INTRAMUSCULAR | Status: DC
  Filled 2022-10-17 (×2): qty 0.3

## 2022-10-17 NOTE — Assessment & Plan Note (Addendum)
Blood pressure systolic 108-150s.. Per cardiology notes- patient been sensitive/intolerant to multiple blood pressure medications. -Hold losartan 37.5 mg daily - Allow for titration of rate limiting medications

## 2022-10-17 NOTE — ED Provider Notes (Signed)
[place] EMERGENCY DEPARTMENT AT [facility] Provider Note   CSN: [id number] Arrival date & time: 10/17/22  1739     History  Chief Complaint  Patient presents with   Atrial Fibrillation    Heidi Dorsey is a 89 y.o. female.   Atrial Fibrillation   89 year old female, history of paroxysmal atrial fibrillation as well as hypertension, she is followed by Dr. Antoine Poche with cardiology as well as her local family doctor.  The patient reports that today she felt like her heart started racing and she was get a pass out, she gets blurred vision when this happens, it has been coming and going, the paramedics found the patient to be in atrial fibrillation but she converted to normal sinus rhythm on arrival.  She did not receive any medications prehospital.  According to the medical record which I have reviewed the patient has been followed in the cardiology office most recently seen within the last month by cardiology, at that time doing overall well, on the hypertensives according to the medical record.  No recent history of heart catheterizations or stress test.     Home Medications Prior to Admission medications   Medication Sig Start Date End Date Taking? Authorizing Provider  Ascorbic Acid 500 MG CAPS Take 500 mg by mouth daily.    [provider]  Biotin 5000 MCG TABS Take 5,000 mcg by mouth daily.    [provider]  calcium carbonate (TUMS - DOSED IN MG ELEMENTAL CALCIUM) 500 MG chewable tablet Chew 1 tablet by mouth as needed for indigestion or heartburn.    [provider]  diltiazem (CARDIZEM) 30 MG tablet Take 1 tablet (30 mg total) by mouth 2 (two) times daily as needed (For a heart rate over 100 bpm). 02/02/22   Rollene Rotunda, MD  hydrALAZINE (APRESOLINE) 10 MG tablet Take 1 tablet (10 mg total) by mouth as needed. Take (1) tablet as needed for blood pressure sustained of 170 or greater systolic Patient not taking: Reported on 09/29/2022  02/18/22   Rollene Rotunda, MD  losartan (COZAAR) 25 MG tablet Take One and half tablets (37.5 mg) in the morning and One tablet (25 mg)in the evening. 09/07/22   Rollene Rotunda, MD  Multiple Vitamin (MULTI-VITAMIN) tablet Take 1 tablet by mouth daily.    [provider]  solifenacin (VESICARE) 10 MG tablet Take 1 tablet (10 mg total) by mouth daily. Patient not taking: Reported on 09/29/2022 06/25/22   Dettinger, Elige Radon, MD  vitamin B-12 (CYANOCOBALAMIN) 100 MCG tablet Take 100 mcg by mouth daily. Patient not taking: Reported on 09/29/2022    [provider]      Allergies    Calcitonin (salmon), Estradiol, Fexofenadine, Miacalcin [calcitonin], Nsaids, Diphenhydramine, Amoxicillin-pot clavulanate, Ciprofloxacin, Clarithromycin, Codeine, Doxepin hcl, Epinephrine, Erythromycin, Fluticasone, Fluticasone propionate, Ipratropium bromide, Latex, Lisinopril, Nitrofurantoin, Other, Sulfa antibiotics, and Tetracycline    Review of Systems   Review of Systems  All other systems reviewed and are negative.   Physical Exam Updated Vital Signs BP 108/78   Pulse (!) 101   Temp 97.7 F (36.5 C)   Resp (!) 21   Ht 1.499 m (4\' 11" )   Wt 59 kg   SpO2 100%   BMI 26.27 kg/m  Physical Exam Vitals and nursing note reviewed.  Constitutional:      General: She is not in acute distress.    Appearance: She is well-developed.  HENT:     Head: Normocephalic and atraumatic.  Mouth/Throat:     Pharynx: No oropharyngeal exudate.  Eyes:     General: No scleral icterus.       Right eye: No discharge.        Left eye: No discharge.     Conjunctiva/sclera: Conjunctivae normal.     Pupils: Pupils are equal, round, and reactive to light.  Neck:     Thyroid: No thyromegaly.     Vascular: No JVD.  Cardiovascular:     Rate and Rhythm: Tachycardia present. Rhythm irregular.     Heart sounds: Normal heart sounds. No murmur heard.    No friction rub. No gallop.     Comments: Rapid A-fib  present up to 170 bpm Pulmonary:     Effort: Pulmonary effort is normal. No respiratory distress.     Breath sounds: Normal breath sounds. No wheezing or rales.  Abdominal:     General: Bowel sounds are normal. There is no distension.     Palpations: Abdomen is soft. There is no mass.     Tenderness: There is no abdominal tenderness.  Musculoskeletal:        General: No tenderness. Normal range of motion.     Cervical back: Normal range of motion and neck supple.     Right lower leg: No edema.     Left lower leg: No edema.  Lymphadenopathy:     Cervical: No cervical adenopathy.  Skin:    General: Skin is warm and dry.     Findings: No erythema or rash.  Neurological:     Mental Status: She is alert.     Coordination: Coordination normal.     Comments: The patient is alert and oriented and follows commands without difficulty  Psychiatric:        Behavior: Behavior normal.     ED Results / Procedures / Treatments   Labs (all labs ordered are listed, but only abnormal results are displayed) Labs Reviewed  COMPREHENSIVE METABOLIC PANEL - Abnormal; Notable for the following components:      Result Value   Sodium 123 (*)    Chloride 91 (*)    Glucose, Bld 104 (*)    Calcium 8.7 (*)    Total Protein 5.9 (*)    All other components within normal limits  CBC WITH DIFFERENTIAL/PLATELET - Abnormal; Notable for the following components:   HCT 35.6 (*)    All other components within normal limits  BRAIN NATRIURETIC PEPTIDE - Abnormal; Notable for the following components:   B Natriuretic Peptide 124.0 (*)    All other components within normal limits  MAGNESIUM  PROTIME-INR  TROPONIN I (HIGH SENSITIVITY)    EKG EKG Interpretation Date/Time:  Sunday October 17 2022 17:58:01 EDT Ventricular Rate:  145 PR Interval:  276 QRS Duration:  133 QT Interval:  336 QTC Calculation: 522 R Axis:   39  Text Interpretation: Wide-QRS tachycardia Left bundle branch block Confirmed by Eber Hong ([id number]) on 10/17/2022 6:05:54 PM  Radiology DG Chest Port 1 View  Result Date: 10/17/2022 CLINICAL DATA:  Shortness of breath, atrial fibrillation. EXAM: PORTABLE CHEST 1 VIEW COMPARISON:  07/07/2022 FINDINGS: Stable heart size and mediastinal contours. No focal airspace disease, pleural effusion or pneumothorax. No pulmonary edema. Mild chronic interstitial coarsening. Overlying snaps. Bilateral shoulder arthropathy. IMPRESSION: No acute chest findings. Mild chronic interstitial coarsening. Electronically Signed   By: Narda Rutherford M.D.   On: 10/17/2022 18:22    Procedures .Critical Care  Performed by: Eber Hong, MD Authorized  by: Eber Hong, MD   Critical care provider statement:    Critical care time (minutes):  45   Critical care time was exclusive of:  Separately billable procedures and treating other patients and teaching time   Critical care was necessary to treat or prevent imminent or life-threatening deterioration of the following conditions:  Cardiac failure   Critical care was time spent personally by me on the following activities:  Development of treatment plan with patient or surrogate, discussions with consultants, evaluation of patient's response to treatment, examination of patient, ordering and review of laboratory studies, ordering and review of radiographic studies, ordering and performing treatments and interventions, pulse oximetry, re-evaluation of patient's condition, review of old charts and obtaining history from patient or surrogate   I assumed direction of critical care for this patient from another provider in my specialty: no     Care discussed with: admitting provider   Comments:           Medications Ordered in ED Medications  0.9 %  sodium chloride infusion (has no administration in time range)  diltiazem (CARDIZEM) 1 mg/mL load via infusion 10 mg (has no administration in time range)    And  diltiazem (CARDIZEM) 125 mg in dextrose 5% 125  mL (1 mg/mL) infusion (has no administration in time range)  metoprolol tartrate (LOPRESSOR) injection 5 mg (5 mg Intravenous Given 10/17/22 1807)    ED Course/ Medical Decision Making/ A&P                             Medical Decision Making Amount and/or Complexity of Data Reviewed Labs: ordered. Radiology: ordered. ECG/medicine tests: ordered.  Risk Prescription drug management.    This patient presents to the ED for concern of rapid heartbeat, this involves an extensive number of treatment options, and is a complaint that carries with it a high risk of complications and morbidity.  The differential diagnosis includes the regular tachycardia, atrial fibrillation, atrial flutter with 2-1 block, SVT, ischemia   Co morbidities that complicate the patient evaluation  Known history of hypertension and paroxysmal atrial fibrillation   Additional history obtained:  Additional history obtained from medical record External records from outside source obtained and reviewed including cardiology notes   Lab Tests:  I Ordered, and personally interpreted labs.  The pertinent results include: Severe hyponatremia, atrial fibrillation with rapid ventricular rate,   Imaging Studies ordered:  I ordered imaging studies including chest x-ray I independently visualized and interpreted imaging which showed no acute findings I agree with the radiologist interpretation   Cardiac Monitoring: / EKG:  The patient was maintained on a cardiac monitor.  I personally viewed and interpreted the cardiac monitored which showed an underlying rhythm of: Atrial fibrillation with rapid ventricular rate   Consultations Obtained:  I requested consultation with the cardiology and hospitalist,  and discussed lab and imaging findings as well as pertinent plan - they recommend: Cardizem drip and admit   Problem List / ED Course / Critical interventions / Medication management  Patient is critically ill  with atrial fibrillation with RVR, may impart be due to hyponatremia which is starting to be corrected, she appears slightly volume neutral I ordered medication including Cardizem drip for rate of atrial fibrillation Reevaluation of the patient after these medicines showed that the patient intermittently going back and forth between sinus rhythm and rapid A-fib I have reviewed the patients home medicines and have made adjustments  as needed   Social Determinants of Health:  Patient is critically ill with hyponatremia and atrial fibrillation, elderly   Test / Admission - Considered:  Will admit to the hospital, appreciate Dr. Mariea Clonts for taking care of this very sick patient, appreciate Dr. Evette Doffing recommendations of admitting on a Cardizem drip and an echocardiogram in the morning.         Final Clinical Impression(s) / ED Diagnoses Final diagnoses:  Atrial fibrillation with rapid ventricular response (HCC)  Hyponatremia    Rx / DC Orders ED Discharge Orders     None         Eber Hong, MD 10/17/22 1906

## 2022-10-17 NOTE — ED Triage Notes (Signed)
Pt called ems for heart racing and feeling like she was going to pass out. Per ems pt was in A-Fib with RVR, but converted herself before receiving cardizem.

## 2022-10-17 NOTE — ED Notes (Signed)
Patients HR continues to go in and our of SVT. HR will go down into the 40s/50s then return to SVT rate of 160s. MD is aware. Will continue to monitor the patient. Pt is A/O x4 NAD. BP is stable.

## 2022-10-17 NOTE — Assessment & Plan Note (Signed)
Daughter reports memory changes over the past several months.  Has not followed up as outpatient for evaluation for dementia.  On my evaluation, patient is awake alert oriented x 4, perseverates frequently, but answering questions.

## 2022-10-17 NOTE — Assessment & Plan Note (Signed)
Sodium 123, baseline 132-139 over the past year. - Trial of IV fluids-N/s 75cc/hr x 20hrs -Obtain urine sodium, urine osmolality and serum osmolality

## 2022-10-17 NOTE — Assessment & Plan Note (Addendum)
Symptomatic with palpitations and dizziness.  Currently on Cardizem drip, per chart - heart rate as high as 140s. Started on Cardizem drip, heart rate now 50s.  Patient spontaneously converting to sinus rhythm.  Potassium 4.2.  Magnesium 2.1. Troponin 8.  CHAD2VAsc of at least 3 for - Age, HTN hx and sex. - EKG showing heart rate 145, baseline LBBB, reviewed by cardiologist Dr. Audria Nine atrial fibrillation.  Recommended Cardizem drip, okay to stay here at [facility]. -Metoprolol 5 mg x 1 given, then Cardizem 10mg  bolus and drip started -Cardiology consult -Echocardiogram -N.p.o. midnight - ??  Tachybradycardia syndrome -Per cardiology note 6/19, not on anticoagulation- felt to be too high risk, patient also did not want to consider Watchman device. - Defer decision of anticoagulation to cardiology - TSH

## 2022-10-17 NOTE — H&P (Signed)
History and Physical    Heidi Dorsey ZOX:[id number] DOB: 08/01/1930 DOA: 10/17/2022  PCP: Sonny Masters, FNP   Patient coming from: Home  Chief Complaint: Palpitations and dizziness  HPI: Heidi Dorsey is a 89 y.o. female with medical history significant for paroxysmal atrial fibrillation, asthma, hypertension, LBBB. Patient presented to the ED with complaints of feeling her heart racing, and dizziness when standing.  Symptoms started about 2 days ago, per patient, may have been earlier than this.  Patient's daughter at bedside, tells me patient has had problems with short memory recently, repeating questions several times, has not been evaluated as outpatient for dementia. On my evaluation, patient is awake and oriented x 4, patient denies chest pain, no difficulty breathing, no lower extremity swelling.  She tells me she is not aware of the diagnosis of atrial fibrillation.  Patient keeps perseverating about working for 42 yrs at [employer], being a Scientist, water quality.  EMS reports patient's was initially in atrial fibrillation RVR, without intervention she converted to sinus rhythm on arrival to the ED.  ED Course: Temperature 97.7.  Heart rate 59 to 140s.  Respiratory rate 17-33.  Blood pressure systolic 10 8-1 53. Sodium 123.  Potassium 4.2.  Magnesium 2.1.  Troponin 8.  BNP 124.  Chest x-ray without acute findings, shows mild chronic interstitial coarsening. EDP talked to cardiology- Dr Evette Doffing, reviewed rhythm,- Atria Fib, not ventricular rhythm, patient has baseline LBBB,, okay for patient to stay here at [facility], start Cardizem drip, obtain echo, cardiology team to see in AM.  Review of Systems: As per HPI all other systems reviewed and negative.  Past Medical History:  Diagnosis Date   Asthma    BPPV (benign paroxysmal positional vertigo)    Claustrophobia    Diverticulosis    GERD (gastroesophageal reflux disease)    H/O degenerative disc disease    Hiatal hernia    Hyperlipidemia     Hypertension    Kidney stones    Migraines    Osteoarthritis    Caused feet deformity   Osteopenia    Prediabetes    Preglaucoma    Raynauds syndrome    Rectocele    Rosacea    Sciatica    Sleep apnea    Spinal stenosis    Vertebrobasilar artery syndrome     Past Surgical History:  Procedure Laterality Date   ABDOMINAL HYSTERECTOMY  1978   ANTERIOR (CYSTOCELE) AND POSTERIOR REPAIR (RECTOCELE) WITH XENFORM GRAFT AND SACROSPINOUS FIXATION     BILATERAL OOPHORECTOMY  1997   bladder stones     kidney colic/ procedure performed in 1973 at [facility]   BLADDER SUSPENSION     BREAST BIOPSY Left 12/1987   neg   BREAST BIOPSY Right 02/1977   neg papilloma   CARDIAC CATHETERIZATION  2000   HERNIA REPAIR     plantar fibroma  Left 09/1997   Excised     reports that she has never smoked. She has never used smokeless tobacco. She reports current alcohol use of about 2.0 standard drinks of alcohol per week. She reports that she does not use drugs.  Allergies  Allergen Reactions   Calcitonin ([place]mon) Other (See Comments)    LEFT BUNDLE BRANCH BLOCK   Estradiol Other (See Comments)    Headache   Fexofenadine Other (See Comments)    Syncope   Miacalcin [Calcitonin] Palpitations   Nsaids Other (See Comments)    Stomach ulcer after taking for 3 days in 2009. Negative H. Pylori test.  Diphenhydramine Other (See Comments)    Syncope   Amoxicillin-Pot Clavulanate Rash    Able to take plain amoxicillin   Ciprofloxacin Rash   Clarithromycin Other (See Comments)    CAN TAKE AZITHROMYCIN   Codeine Other (See Comments)   Doxepin Hcl Other (See Comments)    Heart arrhythmia   Epinephrine Other (See Comments)   Erythromycin Other (See Comments)    Stomach irritation   Fluticasone Other (See Comments)   Fluticasone Propionate Other (See Comments)    And similar corticoid steroids   Ipratropium Bromide Other (See Comments)    Syncope   Latex Rash   Lisinopril Other (See Comments)     Other Reaction: BRUISING, CANKERS & ARTHRALGIAS   Nitrofurantoin Other (See Comments)    Multiple mild symptoms - would prefer not to take again   Other Other (See Comments)    Paraphenylendiamine/propanol amine/anti-cholinergic compounds   Sulfa Antibiotics Other (See Comments)   Tetracycline Other (See Comments)    Mouth sores after just 2 doses. DOXYCYCLINE is OK per patient    Family History  Problem Relation Age of Onset   Osteoporosis Mother    Ovarian cancer Mother    Uterine cancer Mother    Brain cancer Father    Bladder Cancer Brother    Rectal cancer Brother    Skin cancer Brother    Osteoporosis Sister    Diabetes Paternal Grandmother    Breast cancer Neg Hx     Prior to Admission medications   Medication Sig Start Date End Date Taking? Authorizing Provider  Ascorbic Acid 500 MG CAPS Take 500 mg by mouth daily.    [provider]  Biotin 5000 MCG TABS Take 5,000 mcg by mouth daily.    [provider]  calcium carbonate (TUMS - DOSED IN MG ELEMENTAL CALCIUM) 500 MG chewable tablet Chew 1 tablet by mouth as needed for indigestion or heartburn.    [provider]  diltiazem (CARDIZEM) 30 MG tablet Take 1 tablet (30 mg total) by mouth 2 (two) times daily as needed (For a heart rate over 100 bpm). 02/02/22   Rollene Rotunda, MD  hydrALAZINE (APRESOLINE) 10 MG tablet Take 1 tablet (10 mg total) by mouth as needed. Take (1) tablet as needed for blood pressure sustained of 170 or greater systolic Patient not taking: Reported on 09/29/2022 02/18/22   Rollene Rotunda, MD  losartan (COZAAR) 25 MG tablet Take One and half tablets (37.5 mg) in the morning and One tablet (25 mg)in the evening. 09/07/22   Rollene Rotunda, MD  Multiple Vitamin (MULTI-VITAMIN) tablet Take 1 tablet by mouth daily.    [provider]  solifenacin (VESICARE) 10 MG tablet Take 1 tablet (10 mg total) by mouth daily. Patient not taking: Reported on 09/29/2022 06/25/22    Dettinger, Elige Radon, MD  vitamin B-12 (CYANOCOBALAMIN) 100 MCG tablet Take 100 mcg by mouth daily. Patient not taking: Reported on 09/29/2022    [provider]    Physical Exam: Vitals:   10/17/22 1742 10/17/22 1744 10/17/22 1805 10/17/22 1810  BP:  (!) 153/72 (!) 146/74 108/78  Pulse:  82 (!) 115 (!) 101  Resp:  17 (!) 33 (!) 21  Temp:  97.7 F (36.5 C)    SpO2:  99% 100% 100%  Weight: 59 kg     Height: 4\' 11"  (1.499 m)       Constitutional: NAD, calm, comfortable Vitals:   10/17/22 1742 10/17/22 1744 10/17/22 1805 10/17/22 1810  BP:  (!) 153/72 (!) 146/74 108/78  Pulse:  82 (!) 115 (!) 101  Resp:  17 (!) 33 (!) 21  Temp:  97.7 F (36.5 C)    SpO2:  99% 100% 100%  Weight: 59 kg     Height: 4\' 11"  (1.499 m)      Eyes: PERRL, lids and conjunctivae normal ENMT: Mucous membranes are moist.  Neck: normal, supple, no masses, no thyromegaly Respiratory: clear to auscultation bilaterally, no wheezing, no crackles. Normal respiratory effort. No accessory muscle use.  Cardiovascular: Irregular rate and rhythm, no murmurs / rubs / gallops. No extremity edema.  Extremities warm Abdomen: no tenderness, no masses palpated. No hepatosplenomegaly. Bowel sounds positive.  Musculoskeletal: no clubbing / cyanosis. No joint deformity upper and lower extremities. Skin: no rashes, lesions, ulcers. No induration Neurologic: No apparent cranial abnormality, moving extremities spontaneously. Psychiatric: Normal judgment and insight. Alert and oriented x 3. Normal mood.   Labs on Admission: I have personally reviewed following labs and imaging studies  CBC: Recent Labs  Lab 10/17/22 1803  WBC 6.1  NEUTROABS 3.9  HGB 12.0  HCT 35.6*  MCV 87.9  PLT 189   Basic Metabolic Panel: Recent Labs  Lab 10/17/22 1803  NA 123*  K 4.2  CL 91*  CO2 24  GLUCOSE 104*  BUN 19  CREATININE 0.65  CALCIUM 8.7*  MG 2.1   GFR: Estimated Creatinine Clearance: 37.3 mL/min (by C-G formula  based on SCr of 0.65 mg/dL). Liver Function Tests: Recent Labs  Lab 10/17/22 1803  AST 21  ALT 19  ALKPHOS 58  BILITOT 0.5  PROT 5.9*  ALBUMIN 3.8   Coagulation Profile: Recent Labs  Lab 10/17/22 1803  INR 1.0   Radiological Exams on Admission: DG Chest Port 1 View  Result Date: 10/17/2022 CLINICAL DATA:  Shortness of breath, atrial fibrillation. EXAM: PORTABLE CHEST 1 VIEW COMPARISON:  07/07/2022 FINDINGS: Stable heart size and mediastinal contours. No focal airspace disease, pleural effusion or pneumothorax. No pulmonary edema. Mild chronic interstitial coarsening. Overlying snaps. Bilateral shoulder arthropathy. IMPRESSION: No acute chest findings. Mild chronic interstitial coarsening. Electronically Signed   By: Narda Rutherford M.D.   On: 10/17/2022 18:22    EKG: Independently reviewed.  Sinus tachycardia rate 145.read as wide-complex tachycardia, but patient has baseline LBBB.  Assessment/Plan Principal Problem:   Atrial flutter with rapid ventricular response (HCC) Active Problems:   Hyponatremia   Cognitive changes   Essential hypertension   LBBB (left bundle branch block)   BPPV (benign paroxysmal positional vertigo)   Asthma, well controlled    Assessment and Plan: * Atrial flutter with rapid ventricular response (HCC) Symptomatic with palpitations and dizziness.  Currently on Cardizem drip, per chart - heart rate as high as 140s. Started on Cardizem drip, heart rate now 50s.  Patient spontaneously converting to sinus rhythm.  Potassium 4.2.  Magnesium 2.1. Troponin 8.  CHAD2VAsc of at least 3 for - Age, HTN hx and sex. - EKG showing heart rate 145, baseline LBBB, reviewed by cardiologist Dr. Audria Nine atrial fibrillation.  Recommended Cardizem drip, okay to stay here at [facility]. -Metoprolol 5 mg x 1 given, then Cardizem 10mg  bolus and drip started -Cardiology consult -Echocardiogram -N.p.o. midnight - ??  Tachybradycardia syndrome -Per cardiology note 6/19,  not on anticoagulation- felt to be too high risk, patient also did not want to consider Watchman device. - Defer decision of anticoagulation to cardiology - TSH  Hyponatremia Sodium 123, baseline 132-139 over the past  year. - Trial of IV fluids-N/s 75cc/hr x 20hrs -Obtain urine sodium, urine osmolality and serum osmolality  Cognitive changes Daughter reports memory changes over the past several months.  Has not followed up as outpatient for evaluation for dementia.  On my evaluation, patient is awake alert oriented x 4, perseverates frequently, but answering questions.  Essential hypertension Blood pressure systolic 108-150s.. Per cardiology notes- patient been sensitive/intolerant to multiple blood pressure medications. -Hold losartan 37.5 mg daily - Allow for titration of rate limiting medications    DVT prophylaxis: LOvenox Code Status: DNR- has a living will, confirmed with patient's daughter Wynelle Cleveland, at bedside Family Communication: Patient's grandson Magdalen Spatz Stephen(Fawn's Son) is HCPOA. Disposition Plan: ~ 2 days Consults called: Cardiology Admission status: Inpt Stepdown I certify that at the point of admission it is my clinical judgment that the patient will require inpatient hospital care spanning beyond 2 midnights from the point of admission due to high intensity of service, high risk for further deterioration and high frequency of surveillance required.   Author: Onnie Boer, MD 10/17/2022 8:27 PM  For on call review [email or web address].

## 2022-10-18 ENCOUNTER — Inpatient Hospital Stay (HOSPITAL_COMMUNITY): Payer: Medicare Other

## 2022-10-18 DIAGNOSIS — E871 Hypo-osmolality and hyponatremia: Secondary | ICD-10-CM | POA: Diagnosis not present

## 2022-10-18 DIAGNOSIS — I4892 Unspecified atrial flutter: Secondary | ICD-10-CM

## 2022-10-18 DIAGNOSIS — I495 Sick sinus syndrome: Secondary | ICD-10-CM

## 2022-10-18 DIAGNOSIS — I10 Essential (primary) hypertension: Secondary | ICD-10-CM | POA: Diagnosis not present

## 2022-10-18 DIAGNOSIS — I7300 Raynaud's syndrome without gangrene: Secondary | ICD-10-CM | POA: Diagnosis not present

## 2022-10-18 LAB — CBC
HCT: 38.5 % (ref 36.0–46.0)
Hemoglobin: 12.7 g/dL (ref 12.0–15.0)
MCH: 29.3 pg (ref 26.0–34.0)
MCHC: 33 g/dL (ref 30.0–36.0)
MCV: 88.9 fL (ref 80.0–100.0)
Platelets: 168 10*3/uL (ref 150–400)
RBC: 4.33 MIL/uL (ref 3.87–5.11)
RDW: 14.1 % (ref 11.5–15.5)
WBC: 6.2 10*3/uL (ref 4.0–10.5)
nRBC: 0 % (ref 0.0–0.2)

## 2022-10-18 LAB — BASIC METABOLIC PANEL
Anion gap: 7 (ref 5–15)
BUN: 15 mg/dL (ref 8–23)
CO2: 23 mmol/L (ref 22–32)
Calcium: 8.5 mg/dL — ABNORMAL LOW (ref 8.9–10.3)
Chloride: 97 mmol/L — ABNORMAL LOW (ref 98–111)
Creatinine, Ser: 0.65 mg/dL (ref 0.44–1.00)
GFR, Estimated: >60 mL/min (ref >60)
Glucose, Bld: 107 mg/dL — ABNORMAL HIGH (ref 70–99)
Potassium: 3.6 mmol/L (ref 3.5–5.1)
Sodium: 127 mmol/L — ABNORMAL LOW (ref 135–145)

## 2022-10-18 LAB — ECHOCARDIOGRAM COMPLETE
Area-P 1/2: 3.53 cm2
Height: 58 in
MV M vel: 0.92 m/s
MV Peak grad: 3.4 mmHg
P 1/2 time: 1003 msec
S' Lateral: 2.5 cm
Weight: 2077.62 oz

## 2022-10-18 LAB — OSMOLALITY, URINE: Osmolality, Ur: 149 mOsm/kg — ABNORMAL LOW (ref 300–900)

## 2022-10-18 LAB — SODIUM, URINE, RANDOM: Sodium, Ur: 31 mmol/L

## 2022-10-18 LAB — OSMOLALITY: Osmolality: 277 mOsm/kg (ref 275–295)

## 2022-10-18 LAB — MRSA NEXT GEN BY PCR, NASAL: MRSA by PCR Next Gen: NOT DETECTED

## 2022-10-18 MED ORDER — SODIUM CHLORIDE 0.9 % IV SOLN: INTRAVENOUS | Status: DC

## 2022-10-18 MED ORDER — DEXTROMETHORPHAN POLISTIREX ER 30 MG/5ML PO SUER
30.0000 mg | Freq: Two times a day (BID) | ORAL | Status: DC | PRN
  Filled 2022-10-18 (×2): qty 5

## 2022-10-18 MED ORDER — MENTHOL 3 MG MT LOZG
1.0000 | LOZENGE | OROMUCOSAL | Status: DC | PRN
  Administered 2022-10-18: 3 mg via ORAL
  Filled 2022-10-18: qty 9

## 2022-10-18 MED ORDER — AMIODARONE HCL 200 MG PO TABS
200.0000 mg | ORAL_TABLET | Freq: Two times a day (BID) | ORAL | Status: DC
  Administered 2022-10-18 – 2022-10-20 (×4): 200 mg via ORAL
  Filled 2022-10-18 (×5): qty 1

## 2022-10-18 NOTE — Consult Note (Addendum)
Cardiology Consultation   Patient ID: Heidi Dorsey MRN: [id number]; DOB: [birth year]  Admit date: 10/17/2022 Date of Consult: 10/18/2022  PCP:  Sonny Masters, FNP   [facility] Providers Cardiologist:  Rollene Rotunda, MD        Patient Profile:   Heidi Dorsey is a 89 y.o. female with a hx of PAF, HTN, LBBB, asthma who is being seen 10/18/2022 for the evaluation of rapid Aflutter at the request of Dr. Laural Benes.  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 Antoine Poche for PAF-not on anticoag due to fall risk, declined watchman, HTN-very sensitive to meds, LBBB, heart murmur-missed echo appt due to lack of transport. Had a heart cath in 2000 (don't have results) stress echo 2017 EF 50%.   Patient saw Dr. Antoine Poche recently after ED visit for HTN and SOB.   Patient admitted yesterday with Aflutter with RVR. Sodium 123 up to 127 today, BNP 124. Patient says she has felt dizzy like she was going to pass out off/on for several weeks. Sometimes occurs every 10 min. Doesn't feel heart racing. Does get chest tightness when she is dizzy. While talking to her and reviewing telemetry she is in sinus brady at 44/m then jumps into Aflutter 140/m on IV diltiazem. She starts to cough and then slows down again. She has asthma and says she coughs every time she lays down. Has been drinking 2 Liters water daily. Complains of urinary frequency and little flow. Has been off balance for several months, increase fatigue. Lives alone, no longer drives. Short term memory loss.    Past Medical History:  Diagnosis Date   Asthma    BPPV (benign paroxysmal positional vertigo)    Claustrophobia    Diverticulosis    GERD (gastroesophageal reflux disease)    H/O degenerative disc disease    Hiatal hernia    Hyperlipidemia    Hypertension    Kidney stones    Migraines    Osteoarthritis    Caused feet deformity   Osteopenia    Prediabetes    Preglaucoma    Raynauds  syndrome    Rectocele    Rosacea    Sciatica    Sleep apnea    Spinal stenosis    Vertebrobasilar artery syndrome     Past Surgical History:  Procedure Laterality Date   ABDOMINAL HYSTERECTOMY  1978   ANTERIOR (CYSTOCELE) AND POSTERIOR REPAIR (RECTOCELE) WITH XENFORM GRAFT AND SACROSPINOUS FIXATION     BILATERAL OOPHORECTOMY  1997   bladder stones     kidney colic/ procedure performed in 1973 at [facility]   BLADDER SUSPENSION     BREAST BIOPSY Left 12/1987   neg   BREAST BIOPSY Right 02/1977   neg papilloma   CARDIAC CATHETERIZATION  2000   HERNIA REPAIR     plantar fibroma  Left 09/1997   Excised     Home Medications:  Prior to Admission medications   Medication Sig Start Date End Date Taking? Authorizing Provider  ASCORBIC ACID PO Take 1 tablet by mouth daily. OTC vitamin C   Yes [provider]  BIOTIN PO Take 1 tablet by mouth daily.   Yes [provider]  Calcium Carbonate Antacid (TUMS PO) Take 1 tablet by mouth daily as needed (GERD).   Yes [provider]  Cholecalciferol (VITAMIN D-3 PO) Take 1 capsule by mouth daily.   Yes [provider]  diltiazem (CARDIZEM) 30 MG tablet Take 1  tablet (30 mg total) by mouth 2 (two) times daily as needed (For a heart rate over 100 bpm). 02/02/22  Yes Rollene Rotunda, MD  losartan (COZAAR) 25 MG tablet Take One and half tablets (37.5 mg) in the morning and One tablet (25 mg)in the evening. Patient taking differently: Take 25-37.5 mg by mouth See admin instructions. 1.5 tablet (37.5mg ) every morning, 1 tablet (25mg ) in the evening. 09/07/22  Yes Hochrein, Fayrene Fearing, MD  Multiple Vitamins-Minerals (CENTRUM SILVER 50+WOMEN) TABS Take 1 tablet by mouth daily.   Yes [provider]  Omega-3 Fatty Acids (FISH OIL PO) Take 1 capsule by mouth daily.   Yes [provider]    Inpatient Medications: Scheduled Meds:  Chlorhexidine Gluconate Cloth  6 each Topical Q0600   diltiazem  10 mg Intravenous  Once   enoxaparin (LOVENOX) injection  30 mg Subcutaneous Q24H   Continuous Infusions:  sodium chloride 75 mL/hr at 10/17/22 2216   diltiazem (CARDIZEM) infusion 5 mg/hr (10/17/22 1906)   PRN Meds: acetaminophen **OR** acetaminophen, polyethylene glycol  Allergies:    Allergies  Allergen Reactions   Allegra [Fexofenadine] Other (See Comments)    Syncope   Estrace [Estradiol] Other (See Comments)    Headache   Miacalcin [Calcitonin] Palpitations    Left bundle branch block   Nsaids Other (See Comments)    Stomach ulcer after taking for 3 days in 2009. Negative H. Pylori test.   Benadryl [Diphenhydramine] Other (See Comments)    Syncope   Atrovent Hfa [Ipratropium Bromide Hfa] Other (See Comments)    Syncope    Paraphenylenediamine Other (See Comments)    Unknown reaction   Augmentin [Amoxicillin-Pot Clavulanate] Rash    OK to take plain amoxicillin   Biaxin [Clarithromycin] Rash    OK to take azithromycin   Cipro [Ciprofloxacin Hcl] Rash   Codeine Other (See Comments)    Unknown reaction   Epipen [Epinephrine] Other (See Comments)    Unknown reaction   Erythromycin Other (See Comments)    Stomach irritation   Flonase [Fluticasone] Other (See Comments)    Unknown reaction Similar reactions to corticoid steroids   Latex Rash   Macrobid [Nitrofurantoin] Other (See Comments)    Multiple mild symptoms - would prefer not to take again   Other Other (See Comments)    Propanol amine Anti-cholinergic compounds   Silenor [Doxepin Hcl] Other (See Comments)    Heart arrhythmia   Sulfa Antibiotics Other (See Comments)    Unknown reaction   Sumycin [Tetracycline] Other (See Comments)    Mouth sores OK to take doxycycline   Zestril [Lisinopril] Other (See Comments)    Bruising Cankers Arthralgias    Social History:   Social History   Socioeconomic History   Marital status: Single    Spouse name: Not on file   Number of children: Not on file   Years of education: Not  on file   Highest education level: Not on file  Occupational History   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    Drinks Half Dec[employer] Coffee and Tea   Originally from [place], then lived in [place] most of her life - moved here 09/2019 - feels out of place, having trouble finding others with similar interests   She is agnostic - not religious (although raised Catholic) - strong scientific beliefs    Social Determinants of Health   Financial Resource Strain: Low Risk  (06/07/2022)   Overall Financial Resource Strain (CARDIA)    Difficulty of Paying Living Expenses: Not hard at all  Food Insecurity: No Food Insecurity (10/17/2022)   Hunger Vital Sign    Worried About Running Out of Food in the Last Year: Never true    Ran Out of Food in the Last Year: Never true  Transportation Needs: No Transportation Needs (10/17/2022)   PRAPARE - Administrator, Civil Service (Medical): No    Lack of Transportation (Non-Medical): No  Physical Activity: Insufficiently Active (06/07/2022)   Exercise Vital Sign    Days of Exercise per Week: 3 days    Minutes of Exercise per Session: 30 min  Stress: No Stress Concern Present (06/07/2022)   [employer] of Occupational Health - Occupational Stress Questionnaire    Feeling of Stress : Not at all  Social Connections: Socially Isolated (06/07/2022)   Social Connection and Isolation Panel [NHANES]    Frequency of  Communication with Friends and Family: More than three times a week    Frequency of Social Gatherings with Friends and Family: More than three times a week    Attends Religious Services: Never    Database administrator or Organizations: No    Attends Banker Meetings: Never    Marital Status: Widowed  Intimate Partner Violence: Not At Risk (10/17/2022)   Humiliation, Afraid, Rape, and Kick questionnaire    Fear of Current or Ex-Partner: No    Emotionally Abused: No    Physically Abused: No    Sexually Abused: No    Family History:     Family History  Problem Relation Age of Onset   Osteoporosis Mother    Ovarian cancer Mother    Uterine cancer Mother    Brain cancer Father    Bladder Cancer Brother    Rectal cancer Brother    Skin cancer Brother    Osteoporosis Sister    Diabetes Paternal Grandmother    Breast cancer Neg Hx      ROS:  Please see the history of present illness.  Review of Systems  Constitutional: Positive for malaise/fatigue.  HENT: Negative.    Eyes:  Positive for vision loss in left eye and vision loss in right eye.  Cardiovascular:  Positive for chest pain, dyspnea on exertion and irregular heartbeat.  Respiratory: Negative.    Hematologic/Lymphatic: Negative.   Musculoskeletal: Negative.  Negative for joint pain.  Gastrointestinal: Negative.   Genitourinary:  Positive for frequency.  Neurological:  Positive for dizziness and weakness.  Psychiatric/Behavioral:  Positive for memory loss.     All other ROS reviewed and negative.     Physical Exam/Data:   Vitals:   10/18/22 0500 10/18/22 0517 10/18/22 0600 10/18/22 0727  BP: (!) 151/130  (!) 150/48   Pulse: 66  (!) 49   Resp: 13  (!) 22   Temp:  97.6 F (36.4  C)  97.6 F (36.4 C)  TempSrc:  Oral  Oral  SpO2: 96%  96%   Weight:  58.9 kg    Height:        Intake/Output Summary (Last 24 hours) at 10/18/2022 0807 Last data filed at 10/18/2022 0600 Gross per 24 hour  Intake 711.6 ml   Output 475 ml  Net 236.6 ml      10/18/2022    5:17 AM 10/17/2022    9:05 PM 10/17/2022    5:42 PM  Last 3 Weights  Weight (lbs) 129 lb 13.6 oz 131 lb 9.8 oz 130 lb 1.1 oz  Weight (kg) 58.9 kg 59.7 kg 59 kg     Body mass index is 27.14 kg/m.  General:  Well nourished, well developed, in no acute distress  HEENT: normal Neck: no JVD Vascular: No carotid bruits; Distal pulses 2+ bilaterally Cardiac:  normal S1, S2; RRR; 2/6 systolic murmur LSB Lungs:  clear to auscultation bilaterally, no wheezing, rhonchi or rales  Abd: soft, nontender, no hepatomegaly  Ext: no edema Musculoskeletal:  No deformities, BUE and BLE strength normal and equal Skin: warm and dry  Neuro:  CNs 2-12 intact, no focal abnormalities noted Psych:  Normal affect   EKG:  The EKG was personally reviewed and demonstrates:  Atrial flutter RVR with LBBB Telemetry:  Telemetry was personally reviewed and demonstrates:  sinus bradycardia in 40's then Aflutter 140/m  Relevant CV Studies: Echo pending  Laboratory Data:  High Sensitivity Troponin:   Recent Labs  Lab 10/17/22 1803  TROPONINIHS 8     Chemistry Recent Labs  Lab 10/17/22 1803 10/18/22 0449  NA 123* 127*  K 4.2 3.6  CL 91* 97*  CO2 24 23  GLUCOSE 104* 107*  BUN 19 15  CREATININE 0.65 0.65  CALCIUM 8.7* 8.5*  MG 2.1  --   GFRNONAA >60 >60  ANIONGAP 8 7    Recent Labs  Lab 10/17/22 1803  PROT 5.9*  ALBUMIN 3.8  AST 21  ALT 19  ALKPHOS 58  BILITOT 0.5   Hematology Recent Labs  Lab 10/17/22 1803 10/18/22 0449  WBC 6.1 6.2  RBC 4.05 4.33  HGB 12.0 12.7  HCT 35.6* 38.5  MCV 87.9 88.9  MCH 29.6 29.3  MCHC 33.7 33.0  RDW 14.2 14.1  PLT 189 168   Thyroid  Recent Labs  Lab 10/17/22 1803  TSH 2.171    BNP Recent Labs  Lab 10/17/22 1804  BNP 124.0*    Radiology/Studies:  DG Chest Port 1 View  Result Date: 10/17/2022 CLINICAL DATA:  Shortness of breath, atrial fibrillation. EXAM: PORTABLE CHEST 1 VIEW COMPARISON:   07/07/2022 FINDINGS: Stable heart size and mediastinal contours. No focal airspace disease, pleural effusion or pneumothorax. No pulmonary edema. Mild chronic interstitial coarsening. Overlying snaps. Bilateral shoulder arthropathy. IMPRESSION: No acute chest findings. Mild chronic interstitial coarsening. Electronically Signed   By: Narda Rutherford M.D.   On: 10/17/2022 18:22     Assessment and Plan:   SSS/near syncope-Atrial flutter with RVR/sinus bradycardia 40's on IV diltiazem. Consider amio to control afib and OP monitor. May require pacemaker eventually but she wants to read up on it before considering.   PAF-not on anticoagulation because high fall risk and not interested in [place] not been on rate lowering meds at home(diltiazem 30 mg prn but not taking)  LBBB  HTN-BP runs high-on cozaar 37.5 am 35 mg pm at home currently on hold here  Hyponatremia-Na 127 today on  IV fluids   Risk Assessment/Risk Scores:     CHA2DS2-VASc Score = 4   This indicates a 4.8% annual risk of stroke. The patient's score is based upon: CHF History: 0 HTN History: 1 Diabetes History: 0 Stroke History: 0 Vascular Disease History: 0 Age Score: 2 Gender Score: 1     For questions or updates, please contact [facility] Please consult [email or web address] for contact info under    Signed, Jacolyn Reedy, PA-C  10/18/2022 8:07 AM    Attending note:  Patient seen and examined.  I reviewed records and discussed the case with Ms. Lilla Shook, agree with her above findings.  Ms. Nastri presents with intermittent episodes of lightheadedness and relative fatigue, also at times shortness of breath with brief nonproductive cough.  No chest pain or frank sense of palpitations.  She has been found to have paroxysmal atrial flutter with 2:1 block on baseline sinus rhythm with chronic left bundle branch block.  Placed on IV diltiazem by primary team and in sinus bradycardia this morning (heart rate as  low as the 40s at times) with some paroxysms of atrial flutter.  CHA2DS2-VASc score is 4, Dr. Antoine Poche has held off anticoagulation given increased bleeding/fall risk and she declined Watchman device consultation.  She appears comfortable this morning.  Afebrile, heart rate 40s to 50s and sinus bradycardia, blood pressure 144/59.  Lungs are clear.  Cardiac exam with RRR and 1/6 stock murmur, no gallop.  No peripheral edema.  Pertinent lab work includes sodium 127 up from 123, potassium 3.6, BUN 15, creatinine 0.65, BNP 124, normal high-sensitivity troponin I, hemoglobin 12.7, platelets 168, TSH 2.17.  Recent tracings reviewed showing atrial flutter with 2:1 block and chronic left bundle branch block, also sinus bradycardia with left bundle branch block.  Chest x-ray reports no acute findings with chronic interstitial coarsening.  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  CHA2DS2-VASc score is 4, not anticoagulated as discussed above and declines Watchman device consultation.  I did talk with her about possibility of EP consultation for pacemaker, she would like to think about this more prior to proceeding.  In the meanwhile we will get an echocardiogram (which had been previously ordered by Dr. Antoine Poche) and also take her off IV diltiazem with transition to oral amiodarone 200 mg twice daily for low-dose load.  If she tolerates this might be able to consider discharge tomorrow with outpatient ZIO and follow-up with Dr. Antoine Poche.  Otherwise if rhythm is not stable under hospital observation would consider moving her to [facility] for EP consultation.  Jonelle Sidle, M.D., F.A.C.C.

## 2022-10-18 NOTE — Progress Notes (Signed)
  Echocardiogram 2D Echocardiogram has been performed.  Maren Reamer 10/18/2022, 11:57 AM

## 2022-10-18 NOTE — Progress Notes (Addendum)
10/18/2022  5:11 PM  Update from bedside RN.  Pt lost all IV access and declining to have it replaced, will DC IVF orders.    Maryln Manuel MD

## 2022-10-18 NOTE — Progress Notes (Signed)
Pt refuses to be in the cardiac monitor for tonight. MD informed. Pt refuses all her night time medicines too including her Lovenox shot and Amiodarone pill. This RN explained the benefits of getting those meds and the risks of not getting it, especially the Amiodarone pill, but pt still pt refuses it.

## 2022-10-18 NOTE — Progress Notes (Addendum)
PROGRESS NOTE    Heidi Dorsey  ZOX:[id number] DOB: [birth year] DOA: 10/17/2022 PCP: Sonny Masters, FNP   Brief Narrative:  Heidi Dorsey is a 89 y.o. female with medical history significant for paroxysmal atrial fibrillation, asthma, hypertension, LBBB who presented with complaints of palpitations and dizziness when standing and is admitted for treatment of atrial flutter with RVR.  Symptoms started about 2 days ago, per patient, may have been earlier than this. Patient's daughter noted pt has had problems with short memory recently, repeating questions several times, has not been evaluated as outpatient for dementia.  Pt was started on IV Diltiazem on 7/7, but HR dropped down to 40s. Was seen by Cardiology on 7/8 and recommended switch to PO Amiodarone 200mg  BID and possible discharge on 7/9 with outpatient [facility]io.    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  HR dropped to low 40s while on Cardizem drip. Seen by Cardiology today and now switched to PO Amiodarone 200mg  BID. Continues to occasionally increase HR up to 130s, before returning to 60s, without continued symptoms. 7/8 Echo with EF 60-65%, mild concentric LVH, Grade I diastolic dysfunction, and mild AV regurgitation. Per Cards, pt may be able to discharge tomorrow. - Cardiology consulted and appreciate recs - Per cardiology, CHA2DS2-VASc score is 4 but pt felt too high risk, declines Watchman -  Continue 200mg  Amiodarone BID  Hyponatremia Sodium 123 on admission, baseline 132-139 over the past year. After NS overnight, Na up to 127. Serum Osm wnl  277, Ur Osm low at 149, Ur Na at 31; given pt's admittedly "picky" diet and low PO consumption but intentional increased PO hydration, labs are most likely consistent with "tea and toast" hyponatremia.  - Trial of IV fluids-N/s 75cc/hr x 20hrs - Encouraged increased PO intake  Cognitive changes Daughter reports memory changes over the past several months.  Has not followed up as outpatient for evaluation for dementia.  On evaluation, patient is awake alert oriented x 4, perseverates frequently, but answering questions. - Will need further outpt follow up  Essential hypertension Blood pressure systolic 108-150s.. Per cardiology notes- patient been sensitive/intolerant to multiple blood pressure medications. - Hold losartan 37.5 mg daily - Allow for titration of rate limiting medications    DVT prophylaxis: Lovenox Code Status: Full Family Communication: Daughter   Consultants:  Cardiology  Procedures:  None  Antimicrobials:  None   Subjective: Patient seen and evaluated today with no new acute complaints or concerns. She notes that she has not had repeated episodes of light headedness, including during rounds at which time she momentarily went in to RVR.  Objective: Vitals:   10/18/22 0727 10/18/22 1058 10/18/22 1117 10/18/22 1129  BP:  (!) 138/47    Pulse:      Resp:  19 18 11   Temp: 97.6 F (36.4 C) 97.6 F (36.4 C) (!) 97.5 F (36.4 C)   TempSrc: Oral Oral Oral   SpO2:  94%    Weight:      Height:        Intake/Output Summary (Last 24 hours) at 10/18/2022 1506 Last data filed at 10/18/2022 1500 Gross per 24 hour  Intake 1095.08 ml  Output 1075 ml  Net 20.08 ml   Filed Weights   10/17/22 1742 10/17/22 2105 10/18/22 0517  Weight: 59 kg 59.7 kg 58.9 kg    Examination:  General exam: Appears calm and comfortable  Respiratory system: Clear to auscultation. Respiratory effort normal. Cardiovascular system: S1 & S2 heard, RRR.  Gastrointestinal system: Abdomen  is soft, bowel sounds present Central nervous system: Alert and awake Extremities: No edema Skin: No significant lesions noted Psychiatry: Responsive affect.  Data Reviewed: I have personally reviewed following labs and imaging studies  CBC: Recent Labs  Lab 10/17/22 1803 10/18/22 0449  WBC 6.1 6.2  NEUTROABS 3.9  --   HGB 12.0 12.7  HCT 35.6* 38.5  MCV 87.9 88.9  PLT 189 168   Basic Metabolic Panel: Recent Labs  Lab 10/17/22 1803 10/18/22 0449  NA 123* 127*  K 4.2 3.6  CL 91* 97*  CO2 24 23  GLUCOSE 104* 107*  BUN 19 15  CREATININE 0.65 0.65  CALCIUM 8.7* 8.5*  MG 2.1  --    GFR: Estimated Creatinine Clearance: 36.2 mL/min (by C-G formula based on SCr of 0.65 mg/dL). Liver Function Tests: Recent Labs  Lab 10/17/22 1803  AST 21  ALT 19  ALKPHOS 58  BILITOT 0.5  PROT 5.9*  ALBUMIN 3.8   No results for input(s): "LIPASE", "AMYLASE" in the last 168 hours. No results for input(s): "AMMONIA" in the last 168 hours. Coagulation Profile: Recent Labs  Lab 10/17/22 1803  INR 1.0   Cardiac Enzymes: No results for input(s): "CKTOTAL", "CKMB", "CKMBINDEX", "TROPONINI" in the last 168 hours. BNP (last 3 results) No results for input(s): "PROBNP" in the last 8760 hours. HbA1C: No results for input(s): "HGBA1C" in the last 72 hours. CBG: No results for input(s): "GLUCAP" in the last 168 hours. Lipid Profile: No results for input(s): "CHOL", "HDL", "LDLCALC", "TRIG", "CHOLHDL", "LDLDIRECT" in the last 72 hours. Thyroid Function Tests: Recent Labs    10/17/22 1803  TSH 2.171   Anemia Panel: No results for input(s): "VITAMINB12", "FOLATE", "FERRITIN", "TIBC", "IRON", "RETICCTPCT" in the last 72 hours. Sepsis Labs: No results for input(s): "PROCALCITON", "LATICACIDVEN" in the last 168 hours.  Recent Results (from the past 240 hour(s))  MRSA Next Gen by PCR, Nasal     Status: None   Collection Time: 10/17/22  8:40 PM   Specimen: Nasal Mucosa; Nasal Swab   Result Value Ref Range Status   MRSA by PCR Next Gen NOT DETECTED NOT DETECTED Final    Comment: (NOTE) The GeneXpert MRSA Assay (FDA approved for NASAL specimens only), is one component of a comprehensive MRSA colonization surveillance program. It is not intended to diagnose MRSA infection nor to guide or monitor treatment for MRSA infections. Test performance is not FDA approved in patients less than 85 years old. Performed at [facility], [address], [place], [place] [address]     Radiology Studies: ECHOCARDIOGRAM COMPLETE  Result Date: 10/18/2022    ECHOCARDIOGRAM REPORT   Patient Name:   GANIYA LIECHTI Date of Exam: 10/18/2022 Medical Rec #:  [id number]     Height:       58.0 in Accession #:    [id number]    Weight:       129.9 lb Date of Birth:  06-Oct-1930    BSA:          1.516 m Patient Age:    89 years      BP:  156/48 mmHg Patient Gender: F             HR:           56 bpm. [facility] Procedure: 2D Echo, Cardiac Doppler and Color Doppler Indications:    Raynauds syndrome [I73.00]  History:        Patient has no prior history of Echocardiogram examinations.                 Arrythmias:LBBB, Atrial Fibrillation and Atrial Flutter; Risk                 Factors:Hypertension, Non-Smoker and Sleep Apnea. Sciatica,                 Raynauds syndrome.  Sonographer:    Aron Baba Referring Phys: [id number] Heloise Beecham EMOKPAE IMPRESSIONS  1. Left ventricular ejection fraction, by estimation, is 60 to 65%. The left ventricle has normal function. The left ventricle has no regional wall motion abnormalities. There is mild concentric left ventricular hypertrophy. Left ventricular diastolic parameters are consistent with Grade I diastolic dysfunction (impaired relaxation).  2. Right ventricular systolic function is normal. The right ventricular size is normal. There is normal pulmonary artery systolic pressure. The estimated right ventricular systolic pressure is 29.5 mmHg.  3. Left  atrial size was upper normal.  4. The mitral valve is grossly normal. Trivial mitral valve regurgitation.  5. The aortic valve is tricuspid. Aortic valve regurgitation is mild. Aortic valve sclerosis is present, with no evidence of aortic valve stenosis. Aortic regurgitation PHT measures 1003 msec.  6. The inferior vena cava is normal in size with <50% respiratory variability, suggesting right atrial pressure of 8 mmHg. Comparison(s): No prior Echocardiogram. FINDINGS  Left Ventricle: Left ventricular ejection fraction, by estimation, is 60 to 65%. The left ventricle has normal function. The left ventricle has no regional wall motion abnormalities. The left ventricular internal cavity size was normal in size. There is  mild concentric left ventricular hypertrophy. Abnormal (paradoxical) septal motion, consistent with left bundle branch block. Left ventricular diastolic parameters are consistent with Grade I diastolic dysfunction (impaired relaxation). Right Ventricle: The right ventricular size is normal. No increase in right ventricular wall thickness. Right ventricular systolic function is normal. There is normal pulmonary artery systolic pressure. The tricuspid regurgitant velocity is 2.32 m/s, and  with an assumed right atrial pressure of 8 mmHg, the estimated right ventricular systolic pressure is 29.5 mmHg. Left Atrium: Left atrial size was upper normal. Right Atrium: Right atrial size was normal in size. Pericardium: There is no evidence of pericardial effusion. Presence of epicardial fat layer. Mitral Valve: The mitral valve is grossly normal. Mild mitral annular calcification. Trivial mitral valve regurgitation. Tricuspid Valve: The tricuspid valve is grossly normal. Tricuspid valve regurgitation is mild. Aortic Valve: The aortic valve is tricuspid. There is mild aortic valve annular calcification. Aortic valve regurgitation is mild. Aortic regurgitation PHT measures 1003 msec. Aortic valve sclerosis is  present, with no evidence of aortic valve stenosis. Pulmonic Valve: The pulmonic valve was not well visualized. Pulmonic valve regurgitation is trivial. Aorta: The aortic root is normal in size and structure. Venous: The inferior vena cava is normal in size with less than 50% respiratory variability, suggesting right atrial pressure of 8 mmHg. IAS/Shunts: No atrial level shunt detected by color flow Doppler.  LEFT VENTRICLE PLAX 2D LVIDd:         3.50 cm   Diastology LVIDs:  2.50 cm   LV e' medial:    3.77 cm/s LV PW:         1.30 cm   LV E/e' medial:  11.0 LV IVS:        1.10 cm   LV e' lateral:   5.96 cm/s LVOT diam:     1.90 cm   LV E/e' lateral: 6.9 LV SV:         69 LV SV Index:   45 LVOT Area:     2.84 cm  RIGHT VENTRICLE RV S prime:     15.70 cm/s TAPSE (M-mode): 1.8 cm LEFT ATRIUM             Index        RIGHT ATRIUM           Index LA diam:        3.70 cm 2.44 cm/m   RA Area:     12.20 cm LA Vol (A2C):   51.9 ml 34.24 ml/m  RA Volume:   28.70 ml  18.94 ml/m LA Vol (A4C):   42.8 ml 28.24 ml/m LA Biplane Vol: 51.9 ml 34.24 ml/m  AORTIC VALVE             PULMONIC VALVE LVOT Vmax:   96.30 cm/s  PR End Diast Vel: 5.20 msec LVOT Vmean:  64.100 cm/s LVOT VTI:    0.243 m AI PHT:      1003 msec  AORTA Ao Root diam: 3.30 cm Ao Asc diam:  3.30 cm MITRAL VALVE               TRICUSPID VALVE MV Area (PHT): 3.53 cm    TR Peak grad:   21.5 mmHg MV Decel Time: 215 msec    TR Vmax:        232.00 cm/s MR Peak grad: 3.4 mmHg MR Vmax:      91.75 cm/s   SHUNTS MV E velocity: 41.40 cm/s  Systemic VTI:  0.24 m MV A velocity: 69.40 cm/s  Systemic Diam: 1.90 cm MV E/A ratio:  0.60 Nona Dell MD Electronically signed by Nona Dell MD Signature Date/Time: 10/18/2022/12:17:26 PM    Final    DG Chest Port 1 View  Result Date: 10/17/2022 CLINICAL DATA:  Shortness of breath, atrial fibrillation. EXAM: PORTABLE CHEST 1 VIEW COMPARISON:  07/07/2022 FINDINGS: Stable heart size and mediastinal contours. No focal  airspace disease, pleural effusion or pneumothorax. No pulmonary edema. Mild chronic interstitial coarsening. Overlying snaps. Bilateral shoulder arthropathy. IMPRESSION: No acute chest findings. Mild chronic interstitial coarsening. Electronically Signed   By: Narda Rutherford M.D.   On: 10/17/2022 18:22    Scheduled Meds:  amiodarone  200 mg Oral BID   Chlorhexidine Gluconate Cloth  6 each Topical Q0600   enoxaparin (LOVENOX) injection  30 mg Subcutaneous Q24H   Continuous Infusions:  sodium chloride 30 mL/hr at 10/18/22 1213     LOS: 1 day    Time spent: 35 minutes  Signed,  Marcelline Mates, MS4  ATTENDING NOTE  Patient seen and examined with Marcelline Mates, Medical student. In addition to supervising the encounter, I played a key role in the decision making process as well as reviewed key findings.   Pt is already feeling better and sodium is improving with gentle IV fluid hydration.  She was seen by cardiology service and now on amiodarone 200 mg BID.  Continue current management and plan to recheck labs in AM.     C. L.  Laural Benes, MD  How to contact the [facility] Attending or Consulting provider 7A - 7P or covering provider during after hours 7P -7A, for this patient?  Check the care team in [facility] and look for a) attending/consulting TRH provider listed and b) the [facility] team listed Log into [email or web address] and use [place]'s universal password to access. If you do not have the password, please contact the hospital operator. Locate the [facility] provider you are looking for under [facility] and page to a number that you can be directly reached. If you still have difficulty reaching the provider, please page the [facility] (Director on Call) for the Hospitalists listed on amion for assistance.

## 2022-10-18 NOTE — Progress Notes (Signed)
Transition of Care [facility]) - Inpatient Brief Assessment   Patient Details  Name: Aalana Villon MRN: [id number] Date of Birth: 1930-11-29  Transition of Care [facility]) CM/SW Contact:    Leitha Bleak, RN Phone Number: 10/18/2022, 2:24 PM   Clinical Narrative:  Patient admitted with Atrial flutter. Lucila Maine is POA, Cardiology consulted. TOC following.   Transition of Care Asessment: Insurance and Status: (P) Insurance coverage has been reviewed Patient has primary care physician: (P) Yes Home environment has been reviewed: (P) Home - Alone Prior level of function:: (P) uses a Hotel manager Home Services: (P) No current home services Social Determinants of Health Reivew: (P) SDOH reviewed no interventions necessary Readmission risk has been reviewed: (P) Yes Transition of care needs: (P) no transition of care needs at this time

## 2022-10-18 NOTE — Hospital Course (Signed)
Heidi Dorsey is a 89 y.o. female with medical history significant for paroxysmal atrial fibrillation, asthma, hypertension, LBBB who presented with complaints of palpitations and dizziness when standing and is admitted for treatment of atrial flutter with RVR.  Symptoms started about 2 days ago, per patient, may have been earlier than this. Patient's daughter noted pt has had problems with short memory recently, repeating questions several times, has not been evaluated as outpatient for dementia.  Pt was started on IV Diltiazem on 7/7, but HR dropped down to 40s. Was seen by Cardiology on 7/8 and recommended switch to PO Amiodarone 200mg  BID. Morning of 7/9 pt appeared to have acute delirium and refused AM Amiodarone dose. After sleeping, did return to cognitive baseline and took Amiodarone. Remains otherwise asymptomatic.

## 2022-10-18 NOTE — Progress Notes (Signed)
Patient very fidgety today, repeatedly removing leads and ambulating to toilet in bathroom without assistance, gait is steady. MD aware of no IV access at this time. Patient appetite poor ate less than 50% of lunch and dinner meal eaten, fluids encouraged and consumed, patient HR has remained stab;e NSR in the 70's without ant ectopy after Cardizem gtt discontinued.

## 2022-10-19 DIAGNOSIS — I10 Essential (primary) hypertension: Secondary | ICD-10-CM | POA: Diagnosis not present

## 2022-10-19 DIAGNOSIS — R4189 Other symptoms and signs involving cognitive functions and awareness: Secondary | ICD-10-CM | POA: Diagnosis not present

## 2022-10-19 DIAGNOSIS — I4892 Unspecified atrial flutter: Secondary | ICD-10-CM | POA: Diagnosis not present

## 2022-10-19 DIAGNOSIS — E871 Hypo-osmolality and hyponatremia: Secondary | ICD-10-CM | POA: Diagnosis not present

## 2022-10-19 LAB — FOLATE: Folate: 34.2 ng/mL (ref >5.9)

## 2022-10-19 LAB — BASIC METABOLIC PANEL
Anion gap: 8 (ref 5–15)
BUN: 12 mg/dL (ref 8–23)
CO2: 23 mmol/L (ref 22–32)
Calcium: 8.6 mg/dL — ABNORMAL LOW (ref 8.9–10.3)
Chloride: 101 mmol/L (ref 98–111)
Creatinine, Ser: 0.7 mg/dL (ref 0.44–1.00)
GFR, Estimated: >60 mL/min (ref >60)
Glucose, Bld: 97 mg/dL (ref 70–99)
Potassium: 4 mmol/L (ref 3.5–5.1)
Sodium: 132 mmol/L — ABNORMAL LOW (ref 135–145)

## 2022-10-19 LAB — MAGNESIUM: Magnesium: 2.1 mg/dL (ref 1.7–2.4)

## 2022-10-19 LAB — SEDIMENTATION RATE: Sed Rate: 9 mm/hr (ref 0–22)

## 2022-10-19 LAB — TSH: TSH: 2.623 u[IU]/mL (ref 0.350–4.500)

## 2022-10-19 LAB — VITAMIN B12: Vitamin B-12: 1054 pg/mL — ABNORMAL HIGH (ref 180–914)

## 2022-10-19 MED ORDER — HALOPERIDOL LACTATE 5 MG/ML IJ SOLN: 1.0000 mg | Freq: Four times a day (QID) | INTRAMUSCULAR | Status: DC | PRN

## 2022-10-19 MED ORDER — LOSARTAN POTASSIUM 50 MG PO TABS
25.0000 mg | ORAL_TABLET | Freq: Two times a day (BID) | ORAL | Status: DC
  Administered 2022-10-19 – 2022-10-20 (×2): 25 mg via ORAL
  Filled 2022-10-19 (×2): qty 1

## 2022-10-19 MED ORDER — LOSARTAN POTASSIUM 25 MG PO TABS: 25.0000 mg | ORAL_TABLET | Freq: Two times a day (BID) | ORAL | Status: DC

## 2022-10-19 MED ORDER — HYDRALAZINE HCL 20 MG/ML IJ SOLN
5.0000 mg | INTRAMUSCULAR | Status: DC | PRN
  Administered 2022-10-19: 5 mg via INTRAVENOUS
  Filled 2022-10-19: qty 1

## 2022-10-19 MED ORDER — LORAZEPAM 0.5 MG PO TABS
0.5000 mg | ORAL_TABLET | Freq: Once | ORAL | Status: AC
  Administered 2022-10-20: 0.5 mg via ORAL
  Filled 2022-10-19: qty 1

## 2022-10-19 NOTE — Progress Notes (Signed)
Patient restless and wandering in room at this time with persistent, dry cough  refusing any assistance from staff or any food/water.  Unable to fully assess patient however patient seems to be oriented to person only at this time - continuing to state that she "wants to go to bed" and when shown where her bed is, refusing to.  Monitoring for safety.

## 2022-10-19 NOTE — TOC Progression Note (Signed)
Transition of Care [facility]) - Progression Note    Patient Details  Name: Heidi Dorsey MRN: [id number] Date of Birth: 08-08-1930  [facility]) CM/SW Contact  Leitha Bleak, RN Phone Number: 10/19/2022, 1:01 PM  Clinical Narrative:   CM spoke with patient's grandson. Patient lives alone. He is agreeable to home health PT/Aide. Sarah with [facility] is accepting. MD ordered, added to AVS.    Barriers to Discharge: Barriers Resolved  Expected Discharge Plan and Services       HH Arranged: PT, Nurse's Aide   Date HH Agency Contacted: 10/19/22 Time HH Agency Contacted: 1000 Representative spoke with at [facility] Agency: Versie Starks   Social Determinants of Health (SDOH) Interventions SDOH Screenings   Food Insecurity: No Food Insecurity (10/17/2022)  Housing: Low Risk  (10/17/2022)  Transportation Needs: No Transportation Needs (10/17/2022)  Utilities: Not At Risk (10/17/2022)  Alcohol Screen: Low Risk  (06/07/2022)  Depression (PHQ2-9): Medium Risk (06/25/2022)  Financial Resource Strain: Low Risk  (06/07/2022)  Physical Activity: Insufficiently Active (06/07/2022)  Social Connections: Socially Isolated (06/07/2022)  Stress: No Stress Concern Present (06/07/2022)  Tobacco Use: Low Risk  (10/17/2022)    Readmission Risk Interventions     No data to display

## 2022-10-19 NOTE — Progress Notes (Signed)
Patient resting in bed. Will let patient sleep. Will continue to monitor.

## 2022-10-19 NOTE — Progress Notes (Signed)
Patient finally back to sleep around 0300 - still refusing care and not allowing staff to take vital signs or otherwise assess.  Deferring lab work and VS until later this AM when she will hopefully be more oriented.

## 2022-10-19 NOTE — Progress Notes (Signed)
   10/19/22 2114  Provider Notification  Provider Name/Title Dr. Carren Rang  Date Provider Notified 10/19/22  Time Provider Notified 2114  Method of Notification  (secure chat)  Notification Reason Red med refusal  Provider response No new orders  Date of Provider Response 10/19/22  Time of Provider Response 2114

## 2022-10-19 NOTE — Progress Notes (Signed)
BP 183/61 at 2200. PRN IV Hydralazine given at 2250. BP rechecked 154/59 (85) at 2350.

## 2022-10-19 NOTE — Progress Notes (Addendum)
Rounding Note    Patient Name: Heidi Dorsey Date of Encounter: 10/19/2022  [facility] Cardiologist: Rollene Rotunda, MD   Subjective   By review of notes, was disoriented overnight and refused medications as well. Lost IV access and refused to have it replaced. In talking with the patient this morning, she requests for me to leave the room. Says she needs to go to bed but refuses to do so. Says everyone keeps waking her up and she needs to leave.   Inpatient Medications    Scheduled Meds:  amiodarone  200 mg Oral BID   Chlorhexidine Gluconate Cloth  6 each Topical Q0600   enoxaparin (LOVENOX) injection  30 mg Subcutaneous Q24H   Continuous Infusions:  PRN Meds: acetaminophen **OR** acetaminophen, dextromethorphan, menthol-cetylpyridinium, polyethylene glycol   Vital Signs    Vitals:   10/18/22 2100 10/18/22 2331 10/19/22 0459 10/19/22 0631  BP: (!) 169/52   (!) 173/45  Pulse: 62   71  Resp: (!) 22   20  Temp:  98.4 F (36.9 C)  98.3 F (36.8 C)  TempSrc:  Oral  Oral  SpO2: 99%   99%  Weight:   57.3 kg   Height:        Intake/Output Summary (Last 24 hours) at 10/19/2022 0724 Last data filed at 10/18/2022 1500 Gross per 24 hour  Intake 404.74 ml  Output 600 ml  Net -195.26 ml      10/19/2022    4:59 AM 10/18/2022    5:17 AM 10/17/2022    9:05 PM  Last 3 Weights  Weight (lbs) 126 lb 5.2 oz 129 lb 13.6 oz 131 lb 9.8 oz  Weight (kg) 57.3 kg 58.9 kg 59.7 kg      Telemetry    Not currently connected.   ECG    No new tracings.   Physical Exam   GEN: Elderly female appearing in no acute distress.   Neck: No JVD Cardiac: RRR, no murmurs, rubs, or gallops.  Respiratory: Clear to auscultation bilaterally without wheezing or rales. GI: Soft, nontender, non-distended  MS: No pitting edema; No deformity. Neuro:  Nonfocal  Psych: A&Ox1 (self).  Labs    High Sensitivity Troponin:   Recent Labs  Lab 10/17/22 1803  TROPONINIHS 8      Chemistry Recent Labs  Lab 10/17/22 1803 10/18/22 0449  NA 123* 127*  K 4.2 3.6  CL 91* 97*  CO2 24 23  GLUCOSE 104* 107*  BUN 19 15  CREATININE 0.65 0.65  CALCIUM 8.7* 8.5*  MG 2.1  --   PROT 5.9*  --   ALBUMIN 3.8  --   AST 21  --   ALT 19  --   ALKPHOS 58  --   BILITOT 0.5  --   GFRNONAA >60 >60  ANIONGAP 8 7    Lipids No results for input(s): "CHOL", "TRIG", "HDL", "LABVLDL", "LDLCALC", "CHOLHDL" in the last 168 hours.  Hematology Recent Labs  Lab 10/17/22 1803 10/18/22 0449  WBC 6.1 6.2  RBC 4.05 4.33  HGB 12.0 12.7  HCT 35.6* 38.5  MCV 87.9 88.9  MCH 29.6 29.3  MCHC 33.7 33.0  RDW 14.2 14.1  PLT 189 168   Thyroid  Recent Labs  Lab 10/17/22 1803  TSH 2.171    BNP Recent Labs  Lab 10/17/22 1804  BNP 124.0*    DDimer No results for input(s): "DDIMER" in the last 168 hours.   Radiology    DG Chest [place]  1 View  Result Date: 10/17/2022 CLINICAL DATA:  Shortness of breath, atrial fibrillation. EXAM: PORTABLE CHEST 1 VIEW COMPARISON:  07/07/2022 FINDINGS: Stable heart size and mediastinal contours. No focal airspace disease, pleural effusion or pneumothorax. No pulmonary edema. Mild chronic interstitial coarsening. Overlying snaps. Bilateral shoulder arthropathy. IMPRESSION: No acute chest findings. Mild chronic interstitial coarsening. Electronically Signed   By: Narda Rutherford M.D.   On: 10/17/2022 18:22    Cardiac Studies   Echocardiogram: 10/18/2022 IMPRESSIONS     1. Left ventricular ejection fraction, by estimation, is 60 to 65%. The  left ventricle has normal function. The left ventricle has no regional  wall motion abnormalities. There is mild concentric left ventricular  hypertrophy. Left ventricular diastolic  parameters are consistent with Grade I diastolic dysfunction (impaired  relaxation).   2. Right ventricular systolic function is normal. The right ventricular  size is normal. There is normal pulmonary artery systolic pressure.  The  estimated right ventricular systolic pressure is 29.5 mmHg.   3. Left atrial size was upper normal.   4. The mitral valve is grossly normal. Trivial mitral valve  regurgitation.   5. The aortic valve is tricuspid. Aortic valve regurgitation is mild.  Aortic valve sclerosis is present, with no evidence of aortic valve  stenosis. Aortic regurgitation PHT measures 1003 msec.   6. The inferior vena cava is normal in size with <50% respiratory  variability, suggesting right atrial pressure of 8 mmHg.   Comparison(s): No prior Echocardiogram.   Patient Profile     89 y.o. female with past medical history of paroxysmal atrial fibrillation (not on anticoagulation due to fall-risk and previously declined anticoagulation), HTN, HLD, GERD, LBBB and asthma who is currently admitted for atrial flutter with RVR and hyponatremia.   Assessment & Plan    1. Atrial Flutter with RVR - Was admitted with atrial flutter with RVR and appears to be complicated by tachy-brady syndrome as HR was in the 40's at times when in NSR. While not currently on telemetry, she appears to be in NSR by examination.  - Was initially on IV Cardizem and was switched to PO Amiodarone 200mg  BID yesterday given intermittent bradycardia. Was provided with information in regards to PPM placement but wished to further review this and declined at that time. She is only A&Ox1 today and is refusing PO medications and also refusing to have another IV placed. If more cooperative upon family member's arrival, would continue with PO Amiodarone. While this is not ideal given her inability to be on anticoagulation and previously declining Watchman device placement, options are limited given her tachy-brady syndrome and previously declining PPM placement as well.   2. HTN - BP was at 173/45 on most recent check but she has refused follow-up vitals. Was on Losartan 37.5mg  in AM/25mg  in PM prior to admission. Can likely restart pending BP trend  but has refused medications as discussed above and lost IV access and refuses to have another placed.   3. LBBB - Noted on prior tracings.   4. Hyponatremia - Na+ 127 yesterday and refused labs today. Will hopefully cooperate for follow-up labs today as this could be contributing to her AMS. By review of notes, family members reported she was having some cognitive changes as an outpatient but it appears this has acutely progressed this admission.    For questions or updates, please contact [facility] Please consult [email or web address] for contact info under        Signed, Lennart Pall  Strader, PA-C  10/19/2022, 7:24 AM    Patient examined chart reviewed Uncooperative and refusing labs and telemetry HR in 70's controlled Refuses Rx not a great anticoagulation candidate and refused Watchman. Tachybrady refuses PPM Further w/u for encephalapathy per primary service. Not much for cardiology to add  Charlton Haws MD [facility]

## 2022-10-19 NOTE — Progress Notes (Addendum)
PROGRESS NOTE    Heidi Dorsey  MVH:[id number] DOB: 1930/07/10 DOA: 10/17/2022 PCP: Sonny Masters, FNP   Brief Narrative:  Heidi Dorsey is a 89 y.o. female with medical history significant for paroxysmal atrial fibrillation, asthma, hypertension, LBBB who presented with complaints of palpitations and dizziness when standing and is admitted for treatment of atrial flutter with RVR.  Symptoms started about 2 days ago, per patient, may have been earlier than this. Patient's daughter noted pt has had problems with short memory recently, repeating questions several times, has not been evaluated as outpatient for dementia.  Pt was started on IV Diltiazem on 7/7, but HR dropped down to 40s. Was seen by Cardiology on 7/8 and recommended switch to PO Amiodarone 200mg  BID. Morning of 7/9 pt appeared to have acute delirium and refused AM Amiodarone dose. After sleeping, did return to cognitive baseline and took Amiodarone. Remains otherwise asymptomatic.     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  HR dropped to low 40s while on Cardizem drip. Seen by Cardiology 7/8 and was switched to PO Amiodarone 200mg  BID. HR remains 60-70s this afternoon, without continued symptoms. 7/8 Echo with EF 60-65%, mild concentric LVH, Grade I diastolic dysfunction, and mild AV regurgitation. Will look to discharge tomorrow. - Cardiology consulted and appreciate recs - Per cardiology, CHA2DS2-VASc score is 4 but pt felt too high risk, declines Watchman - Continue 200mg  Amiodarone BID  Hyponatremia Sodium 123 on admission, baseline  132-139 over the past year. After NS mIVF, Na slowly improved up to 132. Serum Osm wnl 277, Ur Osm low at 149, Ur Na at 31; given pt's admittedly "picky" diet and low PO consumption but intentional increased PO hydration, labs are most likely consistent with "tea and toast" hyponatremia. S/p trial of IV fluids-N/s 75cc/hr x 20hrs. - Encouraged increased PO intake  Cognitive changes Daughter reports memory changes over the past several months.  Has not followed up as outpatient for evaluation for dementia. Appears to have had acute delirium episode this morning, after sleeping returned to baseline. - Delirium precautions - Will need further outpt follow up  Essential hypertension BP has been worse today, 138/47-173/45. Per cardiology notes- patient been sensitive/intolerant to multiple blood pressure medications and home BP med is Losartan 37.5mg  in morning and 25mg  at bedtime which we will now resume. - Resume losartan 37.5mg  morning, 25mg  evening    DVT prophylaxis: Lovenox Code Status: Full Family Communication: Daughter   Consultants:  Cardiology  Procedures:  None  Antimicrobials:  None   Subjective: Patient seen and evaluated today with no new acute complaints or concerns. She notes that she has not had repeated episodes of light headedness. She had an episode of what seems like acute delirium this morning, refused meds and pulled out her IV. She has settled back to baseline after her nap.  Objective: Vitals:   10/19/22 0459 10/19/22 0631 10/19/22 0747 10/19/22 1123  BP:  (!) 173/45    Pulse:  71    Resp:  20    Temp:  98.3 F (36.8 C) 98.4 F (36.9 C) 98.4 F (36.9 C)  TempSrc:  Oral Oral Oral  SpO2:  99%    Weight: 57.3 kg     Height:        Intake/Output Summary (Last 24 hours) at 10/19/2022 1418 Last data filed at 10/19/2022 1203 Gross per 24 hour  Intake 644.74 ml  Output --  Net 644.74 ml   Filed Weights   10/17/22 2105 10/18/22 0517 10/19/22 0459   Weight: 59.7 kg 58.9 kg 57.3 kg    Examination:  General exam: Appears calm and comfortable  Respiratory system: Clear to auscultation. Respiratory effort normal. Cardiovascular system: S1 & S2 heard, RRR.  Gastrointestinal system: Abdomen is soft, bowel sounds present Central nervous system: Alert and awake Extremities: No edema Skin: No significant lesions noted Psychiatry: Responsive affect.  Data Reviewed: I have personally reviewed following labs and imaging studies  CBC: Recent Labs  Lab 10/17/22 1803 10/18/22 0449  WBC 6.1 6.2  NEUTROABS 3.9  --   HGB 12.0 12.7  HCT 35.6* 38.5  MCV 87.9 88.9  PLT 189 168   Basic Metabolic Panel: Recent Labs  Lab 10/17/22 1803 10/18/22 0449 10/19/22 1042  NA 123* 127* 132*  K 4.2 3.6 4.0  CL 91* 97* 101  CO2 24 23 23   GLUCOSE 104* 107* 97  BUN 19 15 12   CREATININE 0.65 0.65 0.70  CALCIUM 8.7* 8.5* 8.6*  MG 2.1  --  2.1   GFR: Estimated Creatinine Clearance: 35.7 mL/min (by C-G formula based on SCr of 0.7 mg/dL). Liver Function Tests: Recent Labs  Lab 10/17/22 1803  AST 21  ALT 19  ALKPHOS 58  BILITOT 0.5  PROT 5.9*  ALBUMIN 3.8   No results for input(s): "LIPASE", "AMYLASE" in the last 168 hours. No results for input(s): "AMMONIA" in the last 168 hours. Coagulation Profile: Recent Labs  Lab 10/17/22 1803  INR 1.0   Cardiac Enzymes: No results for input(s): "CKTOTAL", "CKMB", "CKMBINDEX", "TROPONINI" in the last 168 hours. BNP (last 3 results) No results for input(s): "PROBNP" in the last 8760 hours. HbA1C: No results for input(s): "HGBA1C" in the last 72 hours. CBG: No results for input(s): "GLUCAP" in the last 168 hours. Lipid Profile: No results for input(s): "CHOL", "HDL", "LDLCALC", "TRIG", "CHOLHDL", "LDLDIRECT" in the last 72 hours. Thyroid Function Tests: Recent Labs    10/19/22 1042  TSH 2.623   Anemia Panel: Recent Labs    10/19/22 1042  VITAMINB12 1,054*  FOLATE 34.2   Sepsis  Labs: No results for input(s): "PROCALCITON", "LATICACIDVEN" in the last 168 hours.  Recent Results (from the past 240 hour(s))  MRSA Next Gen by PCR, Nasal     Status: None   Collection Time: 10/17/22  8:40 PM   Specimen: Nasal Mucosa; Nasal Swab  Result Value Ref Range Status   MRSA by PCR Next Gen NOT DETECTED NOT DETECTED Final    Comment: (NOTE) The GeneXpert MRSA Assay (FDA approved for NASAL specimens only), is one component of a comprehensive MRSA colonization surveillance program. It is not intended to diagnose MRSA infection nor to guide or monitor treatment for MRSA infections. Test performance is not FDA approved in patients less than 55 years old. Performed at [facility], [address], [place], [place] [address]     Radiology Studies: ECHOCARDIOGRAM COMPLETE  Result Date: 10/18/2022    ECHOCARDIOGRAM REPORT   Patient Name:   LAQUETTE CAISSIE Date of Exam: 10/18/2022 Medical Rec #:  [id number]     Height:       58.0 in Accession #:  [id number]    Weight:       129.9 lb Date of Birth:  February 12, 1931    BSA:          1.516 m Patient Age:    89 years      BP:           156/48 mmHg Patient Gender: F             HR:           56 bpm. [facility] Procedure: 2D Echo, Cardiac Doppler and Color Doppler Indications:    Raynauds syndrome [I73.00]  History:        Patient has no prior history of Echocardiogram examinations.                 Arrythmias:LBBB, Atrial Fibrillation and Atrial Flutter; Risk                 Factors:Hypertension, Non-Smoker and Sleep Apnea. Sciatica,                 Raynauds syndrome.  Sonographer:    Aron Baba Referring Phys: [id number] Heloise Beecham EMOKPAE IMPRESSIONS  1. Left ventricular ejection fraction, by estimation, is 60 to 65%. The left ventricle has normal function. The left ventricle has no regional wall motion abnormalities. There is mild concentric left ventricular hypertrophy. Left ventricular diastolic parameters are consistent with Grade I  diastolic dysfunction (impaired relaxation).  2. Right ventricular systolic function is normal. The right ventricular size is normal. There is normal pulmonary artery systolic pressure. The estimated right ventricular systolic pressure is 29.5 mmHg.  3. Left atrial size was upper normal.  4. The mitral valve is grossly normal. Trivial mitral valve regurgitation.  5. The aortic valve is tricuspid. Aortic valve regurgitation is mild. Aortic valve sclerosis is present, with no evidence of aortic valve stenosis. Aortic regurgitation PHT measures 1003 msec.  6. The inferior vena cava is normal in size with <50% respiratory variability, suggesting right atrial pressure of 8 mmHg. Comparison(s): No prior Echocardiogram. FINDINGS  Left Ventricle: Left ventricular ejection fraction, by estimation, is 60 to 65%. The left ventricle has normal function. The left ventricle has no regional wall motion abnormalities. The left ventricular internal cavity size was normal in size. There is  mild concentric left ventricular hypertrophy. Abnormal (paradoxical) septal motion, consistent with left bundle branch block. Left ventricular diastolic parameters are consistent with Grade I diastolic dysfunction (impaired relaxation). Right Ventricle: The right ventricular size is normal. No increase in right ventricular wall thickness. Right ventricular systolic function is normal. There is normal pulmonary artery systolic pressure. The tricuspid regurgitant velocity is 2.32 m/s, and  with an assumed right atrial pressure of 8 mmHg, the estimated right ventricular systolic pressure is 29.5 mmHg. Left Atrium: Left atrial size was upper normal. Right Atrium: Right atrial size was normal in size. Pericardium: There is no evidence of pericardial effusion. Presence of epicardial fat layer. Mitral Valve: The mitral valve is grossly normal. Mild mitral annular calcification. Trivial mitral valve regurgitation. Tricuspid Valve: The tricuspid valve is  grossly normal. Tricuspid valve regurgitation is mild. Aortic Valve: The aortic valve is tricuspid. There is mild aortic valve annular calcification. Aortic valve regurgitation is mild. Aortic regurgitation PHT measures 1003 msec. Aortic valve sclerosis is present, with no evidence of aortic valve stenosis. Pulmonic Valve: The pulmonic valve was not well visualized. Pulmonic valve regurgitation is trivial. Aorta: The aortic root is normal in size and structure. Venous:  The inferior vena cava is normal in size with less than 50% respiratory variability, suggesting right atrial pressure of 8 mmHg. IAS/Shunts: No atrial level shunt detected by color flow Doppler.  LEFT VENTRICLE PLAX 2D LVIDd:         3.50 cm   Diastology LVIDs:         2.50 cm   LV e' medial:    3.77 cm/s LV PW:         1.30 cm   LV E/e' medial:  11.0 LV IVS:        1.10 cm   LV e' lateral:   5.96 cm/s LVOT diam:     1.90 cm   LV E/e' lateral: 6.9 LV SV:         69 LV SV Index:   45 LVOT Area:     2.84 cm  RIGHT VENTRICLE RV S prime:     15.70 cm/s TAPSE (M-mode): 1.8 cm LEFT ATRIUM             Index        RIGHT ATRIUM           Index LA diam:        3.70 cm 2.44 cm/m   RA Area:     12.20 cm LA Vol (A2C):   51.9 ml 34.24 ml/m  RA Volume:   28.70 ml  18.94 ml/m LA Vol (A4C):   42.8 ml 28.24 ml/m LA Biplane Vol: 51.9 ml 34.24 ml/m  AORTIC VALVE             PULMONIC VALVE LVOT Vmax:   96.30 cm/s  PR End Diast Vel: 5.20 msec LVOT Vmean:  64.100 cm/s LVOT VTI:    0.243 m AI PHT:      1003 msec  AORTA Ao Root diam: 3.30 cm Ao Asc diam:  3.30 cm MITRAL VALVE               TRICUSPID VALVE MV Area (PHT): 3.53 cm    TR Peak grad:   21.5 mmHg MV Decel Time: 215 msec    TR Vmax:        232.00 cm/s MR Peak grad: 3.4 mmHg MR Vmax:      91.75 cm/s   SHUNTS MV E velocity: 41.40 cm/s  Systemic VTI:  0.24 m MV A velocity: 69.40 cm/s  Systemic Diam: 1.90 cm MV E/A ratio:  0.60 Nona Dell MD Electronically signed by Nona Dell MD Signature Date/Time:  10/18/2022/12:17:26 PM    Final    DG Chest [place] 1 View  Result Date: 10/17/2022 CLINICAL DATA:  Shortness of breath, atrial fibrillation. EXAM: PORTABLE CHEST 1 VIEW COMPARISON:  07/07/2022 FINDINGS: Stable heart size and mediastinal contours. No focal airspace disease, pleural effusion or pneumothorax. No pulmonary edema. Mild chronic interstitial coarsening. Overlying snaps. Bilateral shoulder arthropathy. IMPRESSION: No acute chest findings. Mild chronic interstitial coarsening. Electronically Signed   By: Narda Rutherford M.D.   On: 10/17/2022 18:22    Scheduled Meds:  amiodarone  200 mg Oral BID   Chlorhexidine Gluconate Cloth  6 each Topical Q0600   enoxaparin (LOVENOX) injection  30 mg Subcutaneous Q24H   Continuous Infusions:   LOS: 2 days   Time spent: 35 minutes  Signed,  Marcelline Mates, MS4  ATTENDING NOTE Patient seen and examined with S[employer] student. In addition to supervising the encounter, I played a key role in the decision making process as well as reviewed  key findings.  Pt developed acute delirium overnight and had some sundowning behaviors noted in the early morning hours with inadequate sleep.  She is resting now.  Hopefully her confusion will improve soon as she had been refusing care.  We finally got labs back and her sodium is improving. She had refused her oral amiodarone earlier but hopefully family will be able to get her to take it later.  We did do a medical dementia work up and ordered a dementia blood panel.  We will follow up on those results and we have added delirium precautions.  It is going to be better for her if we can get her back home to her familiar environment as soon as possible.   Rodney Langton, MD  How to contact the [facility] Attending or Consulting provider 7A - 7P or covering provider during after hours 7P -7A, for this patient?  Check the care team in [facility] and look for a) attending/consulting [facility] provider listed and b) the [facility] team  listed Log into [email or web address] and use [place]'s universal password to access. If you do not have the password, please contact the hospital operator. Locate the [facility] provider you are looking for under [facility] and page to a number that you can be directly reached. If you still have difficulty reaching the provider, please page the [facility] (Director on Call) for the Hospitalists listed on amion for assistance.

## 2022-10-19 NOTE — Progress Notes (Signed)
Pt. Is alert and oriented to self. Pt. Is very confused about where she is at. Pt. Continues to ambulate around room stating "I just want to sleep, I haven't slept all night." I try to help patient back to bed but she says "no, I have to pee first and then I will go." Pt. Has done this X2. Pt. Also refused her breakfast and insisted that dietary take her tray back. Pt. Also refuses to wear telemetry monitor and says I can do her vital signs "later." Will continue to monitor.

## 2022-10-20 ENCOUNTER — Other Ambulatory Visit: Payer: Self-pay | Admitting: Cardiology

## 2022-10-20 ENCOUNTER — Ambulatory Visit (INDEPENDENT_AMBULATORY_CARE_PROVIDER_SITE_OTHER): Payer: Medicare Other

## 2022-10-20 DIAGNOSIS — I4891 Unspecified atrial fibrillation: Secondary | ICD-10-CM

## 2022-10-20 LAB — BASIC METABOLIC PANEL
Anion gap: 7 (ref 5–15)
BUN: 20 mg/dL (ref 8–23)
CO2: 23 mmol/L (ref 22–32)
Calcium: 8.4 mg/dL — ABNORMAL LOW (ref 8.9–10.3)
Chloride: 100 mmol/L (ref 98–111)
Creatinine, Ser: 0.72 mg/dL (ref 0.44–1.00)
GFR, Estimated: >60 mL/min (ref >60)
Glucose, Bld: 98 mg/dL (ref 70–99)
Potassium: 3.7 mmol/L (ref 3.5–5.1)
Sodium: 130 mmol/L — ABNORMAL LOW (ref 135–145)

## 2022-10-20 LAB — MAGNESIUM: Magnesium: 2 mg/dL (ref 1.7–2.4)

## 2022-10-20 LAB — RPR: RPR Ser Ql: NONREACTIVE

## 2022-10-20 MED ORDER — AMIODARONE HCL 200 MG PO TABS: 200.0000 mg | ORAL_TABLET | Freq: Two times a day (BID) | ORAL | 0 refills | Status: DC

## 2022-10-20 MED ORDER — LOSARTAN POTASSIUM 25 MG PO TABS: 25.0000 mg | ORAL_TABLET | Freq: Two times a day (BID) | ORAL | 1 refills | Status: DC

## 2022-10-20 MED ORDER — AMIODARONE HCL 200 MG PO TABS: 200.0000 mg | ORAL_TABLET | Freq: Every day | ORAL | 0 refills | Status: DC

## 2022-10-20 NOTE — Progress Notes (Signed)
Patient was discharged without the heart monitor placement. The tech that took the patient down to the main entrance to be discharged was not aware that the patient needed a heart monitor placed before leaving. This nurse spoke with the PA [place] in heart care. [place] states that it is fine and that they can either ship it to the patient or the patient can come to get it placed on her in heart care. This nurse called the POA Magdalen Spatz to let them know what happened. Kristine Linea states they will come by heart care either tomorrow or the next day to pick it up.

## 2022-10-20 NOTE — Care Management Important Message (Signed)
Important Message  Patient Details  Name: Heidi Dorsey MRN: [id number] Date of Birth: [birth year]   Medicare Important Message Given:  Yes     Corey Harold 10/20/2022, 10:10 AM

## 2022-10-20 NOTE — Progress Notes (Signed)
Discharge went over with Chauncy Passy and the patient.

## 2022-10-20 NOTE — TOC Transition Note (Signed)
Transition of Care [facility]) - CM/SW Discharge Note   Patient Details  Name: Heidi Dorsey MRN: [id number] Date of Birth: 11-20-1930  Transition of Care [facility]) CM/SW Contact:  Elliot Gault, LCSW Phone Number: 10/20/2022, 9:20 AM   Clinical Narrative:     Pt medically stable for dc per MD. Plan remains for dc home with [facility] for PT/Aide. Information on pt's AVS. Updated Sarah at [facility] of pt's dc.  No other TOC needs identified.  Final next level of care: Home w Home Health Services Barriers to Discharge: Barriers Resolved   Patient Goals and CMS Choice CMS Medicare.gov Compare Post Acute Care list provided to:: Patient Represenative (must comment) Choice offered to / list presented to : Adult Children  Discharge Placement                    Name of family member notified: GrandSon Patient and family notified of of transfer: 10/19/22  Discharge Plan and Services Additional resources added to the After Visit Summary for                            [facility] Arranged: PT, Nurse's Aide   Date [facility] Agency Contacted: 10/19/22 Time HH Agency Contacted: 1000 Representative spoke with at [facility] Agency: Versie Starks  Social Determinants of Health (SDOH) Interventions SDOH Screenings   Food Insecurity: No Food Insecurity (10/17/2022)  Housing: Low Risk  (10/17/2022)  Transportation Needs: No Transportation Needs (10/17/2022)  Utilities: Not At Risk (10/17/2022)  Alcohol Screen: Low Risk  (06/07/2022)  Depression (PHQ2-9): Medium Risk (06/25/2022)  Financial Resource Strain: Low Risk  (06/07/2022)  Physical Activity: Insufficiently Active (06/07/2022)  Social Connections: Socially Isolated (06/07/2022)  Stress: No Stress Concern Present (06/07/2022)  Tobacco Use: Low Risk  (10/17/2022)     Readmission Risk Interventions     No data to display

## 2022-10-20 NOTE — Progress Notes (Signed)
Progress Note  Patient Name: Heidi Dorsey Date of Encounter: 10/20/2022  Primary Cardiologist: Rollene Rotunda, MD  Interval Summary   States that she slept very well last night.  Carries on normal conversation this morning.  Does not report any palpitations or chest pain.  Vital Signs    Vitals:   10/19/22 2200 10/19/22 2350 10/20/22 0314 10/20/22 0830  BP: (!) 183/61 (!) 154/59 (!) 147/61 (!) 153/60  Pulse:   63 68  Resp:   20   Temp:   97.9 F (36.6 C)   TempSrc:      SpO2:   97%   Weight:      Height:        Intake/Output Summary (Last 24 hours) at 10/20/2022 0900 Last data filed at 10/20/2022 5284 Gross per 24 hour  Intake 720 ml  Output --  Net 720 ml   Filed Weights   10/17/22 2105 10/18/22 0517 10/19/22 0459  Weight: 59.7 kg 58.9 kg 57.3 kg    Physical Exam   GEN: No acute distress.   Neck: No JVD. Cardiac: RRR, 2/6 systolic murmur, no gallop.  Respiratory: Nonlabored. Clear to auscultation bilaterally. GI: Soft, nontender, bowel sounds present. MS: No edema. Neuro:  Nonfocal.  ECG/Telemetry    Telemetry reviewed showing sinus rhythm with IVCD.  No pauses.  Labs    Chemistry Recent Labs  Lab 10/17/22 1803 10/18/22 0449 10/19/22 1042 10/20/22 0408  NA 123* 127* 132* 130*  K 4.2 3.6 4.0 3.7  CL 91* 97* 101 100  CO2 24 23 23 23   GLUCOSE 104* 107* 97 98  BUN 19 15 12 20   CREATININE 0.65 0.65 0.70 0.72  CALCIUM 8.7* 8.5* 8.6* 8.4*  PROT 5.9*  --   --   --   ALBUMIN 3.8  --   --   --   AST 21  --   --   --   ALT 19  --   --   --   ALKPHOS 58  --   --   --   BILITOT 0.5  --   --   --   GFRNONAA >60 >60 >60 >60  ANIONGAP 8 7 8 7     Hematology Recent Labs  Lab 10/17/22 1803 10/18/22 0449  WBC 6.1 6.2  RBC 4.05 4.33  HGB 12.0 12.7  HCT 35.6* 38.5  MCV 87.9 88.9  MCH 29.6 29.3  MCHC 33.7 33.0  RDW 14.2 14.1  PLT 189 168   Cardiac Enzymes Recent Labs  Lab 10/17/22 1803  TROPONINIHS 8   Lipid Panel     Component Value  Date/Time   CHOL 200 (H) 06/30/2021 1441   TRIG 47 06/30/2021 1441   HDL 91 06/30/2021 1441   CHOLHDL 2.2 06/30/2021 1441   LDLCALC 100 (H) 06/30/2021 1441   LABVLDL 9 06/30/2021 1441    Cardiac Studies   Echocardiogram 10/18/2022:  1. Left ventricular ejection fraction, by estimation, is 60 to 65%. The  left ventricle has normal function. The left ventricle has no regional  wall motion abnormalities. There is mild concentric left ventricular  hypertrophy. Left ventricular diastolic  parameters are consistent with Grade I diastolic dysfunction (impaired  relaxation).   2. Right ventricular systolic function is normal. The right ventricular  size is normal. There is normal pulmonary artery systolic pressure. The  estimated right ventricular systolic pressure is 29.5 mmHg.   3. Left atrial size was upper normal.   4. The mitral valve is grossly  normal. Trivial mitral valve  regurgitation.   5. The aortic valve is tricuspid. Aortic valve regurgitation is mild.  Aortic valve sclerosis is present, with no evidence of aortic valve  stenosis. Aortic regurgitation PHT measures 1003 msec.   6. The inferior vena cava is normal in size with <50% respiratory  variability, suggesting right atrial pressure of 8 mmHg.  Assessment & Plan   1.  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  CHA2DS2-VASc score is 4, poor candidate for anticoagulation and declines Watchman device (per discussion with Dr. Antoine Poche).  She is in sinus rhythm this morning and tolerating low-dose amiodarone load.  2.  Delirium with concerns for possible underlying dementia, although no formal workup as yet as an outpatient.  Mentation more clear this morning, moved out of the ICU to regular telemetry bed.  3.  Essential hypertension, continue losartan.  4.  Chronic hyponatremia, sodium 130.  5.  Chronic left bundle branch block.  Discussed with patient this morning and communicated with  hospitalist.  Anticipate discharge home from a cardiac perspective.  Continue amiodarone 200 mg twice daily for total of 7 days then reduce to once daily thereafter.  She needs to be discharged with a Zio patch for 7 days.  Then plan follow-up in the next few weeks with Dr. Antoine Poche for further discussion.  For questions or updates, please contact [facility] Please consult [email or web address] for contact info under   Signed, Nona Dell, MD  10/20/2022, 9:00 AM

## 2022-10-20 NOTE — Discharge Summary (Signed)
Physician Discharge Summary  Heidi Dorsey ZOX:[id number] DOB: Apr 12, 1931 DOA: 10/17/2022  PCP: Heidi Masters, FNP  Admit date: 10/17/2022  Discharge date: 10/20/2022  Admitted From: Home  Disposition:  Home  Recommendations for Outpatient Follow-up:  Follow up with PCP in 1-2 weeks Follow up with Cardiology and Dr. Antoine Dorsey Please obtain BMP/CBC in one week Please follow up on the following pending results:  Zio monitor  Continue 200mg  Amiodarone twice a day, through 7/17. Then on 7/18, begin taking 200mg  Amiodarone once daily. Follow up with Pcp for neurocognitive testing  Home Health: PT and Nurse Aide  Equipment/Devices: None at this time  Discharge Condition:Stable  CODE STATUS: Full  Diet recommendation: Heart Healthy  Brief/Interim Summary: Heidi Dorsey is a 89 y.o. female with medical history significant for paroxysmal atrial fibrillation, asthma, hypertension, LBBB who presented with complaints of palpitations and dizziness when standing and is admitted for treatment of atrial flutter with RVR.  Symptoms started about 2 days ago, per patient, may have been earlier than this. Patient's daughter noted pt has had problems with short memory recently, repeating questions several times, has not been evaluated as outpatient for dementia.  Pt was started on IV Diltiazem on 7/7, but HR dropped down to 40s. Was seen by Cardiology on 7/8 and recommended switch to PO Amiodarone 200mg  BID. Morning of 7/9 pt appeared to have acute delirium and refused AM Amiodarone dose. After sleeping, did return to cognitive baseline and took Amiodarone. Remained otherwise asymptomatic through day of discharge on 7/10, and tolerated Amiodarone PO 200mg  BID. Discharging with total 7 days of Amiodarone 200mg  BID (followed by daily), a Zio patch, and will follow up with Cardiology.   Discharge Diagnoses:  Principal Problem:   Atrial flutter with rapid ventricular response (HCC) Active Problems:    Essential hypertension   LBBB (left bundle branch block)   BPPV (benign paroxysmal positional vertigo)   Asthma, well controlled   Cognitive changes   Hyponatremia    Discharge Instructions  Discharge Instructions     Diet - low sodium heart healthy   Complete by: As directed    Increase activity slowly   Complete by: As directed       Allergies as of 10/20/2022       Reactions   Allegra [fexofenadine] Other (See Comments)   Syncope   Estrace [estradiol] Other (See Comments)   Headache   Miacalcin [calcitonin] Palpitations   Left bundle branch block   Nsaids Other (See Comments)   Stomach ulcer after taking for 3 days in 2009. Negative H. Pylori test.   Benadryl [diphenhydramine] Other (See Comments)   Syncope   Atrovent Hfa [ipratropium Bromide Hfa] Other (See Comments)   Syncope    Paraphenylenediamine Other (See Comments)   Unknown reaction   Augmentin [amoxicillin-pot Clavulanate] Rash   OK to take plain amoxicillin   Biaxin [clarithromycin] Rash   OK to take azithromycin   Cipro [ciprofloxacin Hcl] Rash   Codeine Other (See Comments)   Unknown reaction   Epipen [epinephrine] Other (See Comments)   Unknown reaction   Erythromycin Other (See Comments)   Stomach irritation   Flonase [fluticasone] Other (See Comments)   Unknown reaction Similar reactions to corticoid steroids   Latex Rash   Macrobid [nitrofurantoin] Other (See Comments)   Multiple mild symptoms - would prefer not to take again   Other Other (See Comments)   Propanol amine Anti-cholinergic compounds   Silenor [doxepin Hcl] Other (See Comments)   Heart arrhythmia  Sulfa Antibiotics Other (See Comments)   Unknown reaction   Sumycin [tetracycline] Other (See Comments)   Mouth sores OK to take doxycycline   Zestril [lisinopril] Other (See Comments)   Bruising Cankers Arthralgias        Medication List     STOP taking these medications    diltiazem 30 MG tablet Commonly known  as: CARDIZEM       TAKE these medications    amiodarone 200 MG tablet Commonly known as: PACERONE Take 1 tablet (200 mg total) by mouth 2 (two) times daily for 7 days.   amiodarone 200 MG tablet Commonly known as: Pacerone Take 1 tablet (200 mg total) by mouth daily. Start taking on: October 28, 2022   ASCORBIC ACID PO Take 1 tablet by mouth daily. OTC vitamin C   BIOTIN PO Take 1 tablet by mouth daily.   Centrum Silver 50+Women Tabs Take 1 tablet by mouth daily.   FISH OIL PO Take 1 capsule by mouth daily.   losartan 25 MG tablet Commonly known as: COZAAR Take 1 tablet (25 mg total) by mouth 2 (two) times daily. What changed:  how much to take how to take this when to take this additional instructions   TUMS PO Take 1 tablet by mouth daily as needed (GERD).   VITAMIN D-3 PO Take 1 capsule by mouth daily.        Follow-up Information     [facility] Follow up.   Why: PT/ aide will call to schedule your first visit.        Heidi Asters, NP Follow up on 11/22/2022.   Specialty: Cardiology Why: Cardiology Hospital Follow-up on 11/22/2022 at 2:20 PM. Will be at the [place] office due to no availability in [place]. Will be with Heidi Fabian, NP who works with Dr. Antoine Dorsey. Contact information: [address] [place] [address] [phone]                Allergies  Allergen Reactions   Allegra [Fexofenadine] Other (See Comments)    Syncope   Estrace [Estradiol] Other (See Comments)    Headache   Miacalcin [Calcitonin] Palpitations    Left bundle branch block   Nsaids Other (See Comments)    Stomach ulcer after taking for 3 days in 2009. Negative H. Pylori test.   Benadryl [Diphenhydramine] Other (See Comments)    Syncope   Atrovent Hfa [Ipratropium Bromide Hfa] Other (See Comments)    Syncope    Paraphenylenediamine Other (See Comments)    Unknown reaction   Augmentin [Amoxicillin-Pot Clavulanate] Rash    OK  to take plain amoxicillin   Biaxin [Clarithromycin] Rash    OK to take azithromycin   Cipro [Ciprofloxacin Hcl] Rash   Codeine Other (See Comments)    Unknown reaction   Epipen [Epinephrine] Other (See Comments)    Unknown reaction   Erythromycin Other (See Comments)    Stomach irritation   Flonase [Fluticasone] Other (See Comments)    Unknown reaction Similar reactions to corticoid steroids   Latex Rash   Macrobid [Nitrofurantoin] Other (See Comments)    Multiple mild symptoms - would prefer not to take again   Other Other (See Comments)    Propanol amine Anti-cholinergic compounds   Silenor [Doxepin Hcl] Other (See Comments)    Heart arrhythmia   Sulfa Antibiotics Other (See Comments)    Unknown reaction   Sumycin [Tetracycline] Other (See Comments)    Mouth sores OK to take  doxycycline   Zestril [Lisinopril] Other (See Comments)    Bruising Cankers Arthralgias    Consultations: Cardiology   Procedures/Studies: ECHOCARDIOGRAM COMPLETE  Result Date: 10/18/2022    ECHOCARDIOGRAM REPORT   Patient Name:   Heidi Dorsey Date of Exam: 10/18/2022 Medical Rec #:  [id number]     Height:       58.0 in Accession #:    [id number]    Weight:       129.9 lb Date of Birth:  April 14, 1930    BSA:          1.516 m Patient Age:    89 years      BP:           156/48 mmHg Patient Gender: F             HR:           56 bpm. [facility] Procedure: 2D Echo, Cardiac Doppler and Color Doppler Indications:    Raynauds syndrome [I73.00]  History:        Patient has no prior history of Echocardiogram examinations.                 Arrythmias:LBBB, Atrial Fibrillation and Atrial Flutter; Risk                 Factors:Hypertension, Non-Smoker and Sleep Apnea. Sciatica,                 Raynauds syndrome.  Sonographer:    Aron Baba Referring Phys: [id number] Heloise Beecham EMOKPAE IMPRESSIONS  1. Left ventricular ejection fraction, by estimation, is 60 to 65%. The left ventricle has normal function. The  left ventricle has no regional wall motion abnormalities. There is mild concentric left ventricular hypertrophy. Left ventricular diastolic parameters are consistent with Grade I diastolic dysfunction (impaired relaxation).  2. Right ventricular systolic function is normal. The right ventricular size is normal. There is normal pulmonary artery systolic pressure. The estimated right ventricular systolic pressure is 29.5 mmHg.  3. Left atrial size was upper normal.  4. The mitral valve is grossly normal. Trivial mitral valve regurgitation.  5. The aortic valve is tricuspid. Aortic valve regurgitation is mild. Aortic valve sclerosis is present, with no evidence of aortic valve stenosis. Aortic regurgitation PHT measures 1003 msec.  6. The inferior vena cava is normal in size with <50% respiratory variability, suggesting right atrial pressure of 8 mmHg. Comparison(s): No prior Echocardiogram. FINDINGS  Left Ventricle: Left ventricular ejection fraction, by estimation, is 60 to 65%. The left ventricle has normal function. The left ventricle has no regional wall motion abnormalities. The left ventricular internal cavity size was normal in size. There is  mild concentric left ventricular hypertrophy. Abnormal (paradoxical) septal motion, consistent with left bundle branch block. Left ventricular diastolic parameters are consistent with Grade I diastolic dysfunction (impaired relaxation). Right Ventricle: The right ventricular size is normal. No increase in right ventricular wall thickness. Right ventricular systolic function is normal. There is normal pulmonary artery systolic pressure. The tricuspid regurgitant velocity is 2.32 m/s, and  with an assumed right atrial pressure of 8 mmHg, the estimated right ventricular systolic pressure is 29.5 mmHg. Left Atrium: Left atrial size was upper normal. Right Atrium: Right atrial size was normal in size. Pericardium: There is no evidence of pericardial effusion. Presence of  epicardial fat layer. Mitral Valve: The mitral valve is grossly normal. Mild mitral annular calcification. Trivial mitral valve regurgitation. Tricuspid Valve: The tricuspid valve is grossly  normal. Tricuspid valve regurgitation is mild. Aortic Valve: The aortic valve is tricuspid. There is mild aortic valve annular calcification. Aortic valve regurgitation is mild. Aortic regurgitation PHT measures 1003 msec. Aortic valve sclerosis is present, with no evidence of aortic valve stenosis. Pulmonic Valve: The pulmonic valve was not well visualized. Pulmonic valve regurgitation is trivial. Aorta: The aortic root is normal in size and structure. Venous: The inferior vena cava is normal in size with less than 50% respiratory variability, suggesting right atrial pressure of 8 mmHg. IAS/Shunts: No atrial level shunt detected by color flow Doppler.  LEFT VENTRICLE PLAX 2D LVIDd:         3.50 cm   Diastology LVIDs:         2.50 cm   LV e' medial:    3.77 cm/s LV PW:         1.30 cm   LV E/e' medial:  11.0 LV IVS:        1.10 cm   LV e' lateral:   5.96 cm/s LVOT diam:     1.90 cm   LV E/e' lateral: 6.9 LV SV:         69 LV SV Index:   45 LVOT Area:     2.84 cm  RIGHT VENTRICLE RV S prime:     15.70 cm/s TAPSE (M-mode): 1.8 cm LEFT ATRIUM             Index        RIGHT ATRIUM           Index LA diam:        3.70 cm 2.44 cm/m   RA Area:     12.20 cm LA Vol (A2C):   51.9 ml 34.24 ml/m  RA Volume:   28.70 ml  18.94 ml/m LA Vol (A4C):   42.8 ml 28.24 ml/m LA Biplane Vol: 51.9 ml 34.24 ml/m  AORTIC VALVE             PULMONIC VALVE LVOT Vmax:   96.30 cm/s  PR End Diast Vel: 5.20 msec LVOT Vmean:  64.100 cm/s LVOT VTI:    0.243 m AI PHT:      1003 msec  AORTA Ao Root diam: 3.30 cm Ao Asc diam:  3.30 cm MITRAL VALVE               TRICUSPID VALVE MV Area (PHT): 3.53 cm    TR Peak grad:   21.5 mmHg MV Decel Time: 215 msec    TR Vmax:        232.00 cm/s MR Peak grad: 3.4 mmHg MR Vmax:      91.75 cm/s   SHUNTS MV E velocity: 41.40  cm/s  Systemic VTI:  0.24 m MV A velocity: 69.40 cm/s  Systemic Diam: 1.90 cm MV E/A ratio:  0.60 Nona Dell MD Electronically signed by Nona Dell MD Signature Date/Time: 10/18/2022/12:17:26 PM    Final    DG Chest Port 1 View  Result Date: 10/17/2022 CLINICAL DATA:  Shortness of breath, atrial fibrillation. EXAM: PORTABLE CHEST 1 VIEW COMPARISON:  07/07/2022 FINDINGS: Stable heart size and mediastinal contours. No focal airspace disease, pleural effusion or pneumothorax. No pulmonary edema. Mild chronic interstitial coarsening. Overlying snaps. Bilateral shoulder arthropathy. IMPRESSION: No acute chest findings. Mild chronic interstitial coarsening. Electronically Signed   By: Narda Rutherford M.D.   On: 10/17/2022 18:22     Discharge Exam: Vitals:   10/20/22 0314 10/20/22 0830  BP: (!) 147/61 (!) 153/60  Pulse: 63 68  Resp: 20   Temp: 97.9 F (36.6 C)   SpO2: 97%    Vitals:   10/19/22 2200 10/19/22 2350 10/20/22 0314 10/20/22 0830  BP: (!) 183/61 (!) 154/59 (!) 147/61 (!) 153/60  Pulse:   63 68  Resp:   20   Temp:   97.9 F (36.6 C)   TempSrc:      SpO2:   97%   Weight:      Height:        General: Pt is alert, awake, not in acute distress Cardiovascular: RRR, S1/S2 +, no rubs, no gallops Respiratory: CTA bilaterally, no wheezing, no rhonchi Abdominal: Soft, NT, ND, bowel sounds + Extremities: no edema, no cyanosis    The results of significant diagnostics from this hospitalization (including imaging, microbiology, ancillary and laboratory) are listed below for reference.     Microbiology: Recent Results (from the past 240 hour(s))  MRSA Next Gen by PCR, Nasal     Status: None   Collection Time: 10/17/22  8:40 PM   Specimen: Nasal Mucosa; Nasal Swab  Result Value Ref Range Status   MRSA by PCR Next Gen NOT DETECTED NOT DETECTED Final    Comment: (NOTE) The GeneXpert MRSA Assay (FDA approved for NASAL specimens only), is one component of a comprehensive MRSA  colonization surveillance program. It is not intended to diagnose MRSA infection nor to guide or monitor treatment for MRSA infections. Test performance is not FDA approved in patients less than 15 years old. Performed at [facility], [address], [place], [place] [address]      Labs: BNP (last 3 results) Recent Labs    10/17/22 1804  BNP 124.0*   Basic Metabolic Panel: Recent Labs  Lab 10/17/22 1803 10/18/22 0449 10/19/22 1042 10/20/22 0408  NA 123* 127* 132* 130*  K 4.2 3.6 4.0 3.7  CL 91* 97* 101 100  CO2 24 23 23 23   GLUCOSE 104* 107* 97 98  BUN 19 15 12 20   CREATININE 0.65 0.65 0.70 0.72  CALCIUM 8.7* 8.5* 8.6* 8.4*  MG 2.1  --  2.1 2.0   Liver Function Tests: Recent Labs  Lab 10/17/22 1803  AST 21  ALT 19  ALKPHOS 58  BILITOT 0.5  PROT 5.9*  ALBUMIN 3.8   No results for input(s): "LIPASE", "AMYLASE" in the last 168 hours. No results for input(s): "AMMONIA" in the last 168 hours. CBC: Recent Labs  Lab 10/17/22 1803 10/18/22 0449  WBC 6.1 6.2  NEUTROABS 3.9  --   HGB 12.0 12.7  HCT 35.6* 38.5  MCV 87.9 88.9  PLT 189 168   Cardiac Enzymes: No results for input(s): "CKTOTAL", "CKMB", "CKMBINDEX", "TROPONINI" in the last 168 hours. BNP: Invalid input(s): "POCBNP" CBG: No results for input(s): "GLUCAP" in the last 168 hours. D-Dimer No results for input(s): "DDIMER" in the last 72 hours. Hgb A1c No results for input(s): "HGBA1C" in the last 72 hours. Lipid Profile No results for input(s): "CHOL", "HDL", "LDLCALC", "TRIG", "CHOLHDL", "LDLDIRECT" in the last 72 hours. Thyroid function studies Recent Labs    10/19/22 1042  TSH 2.623   Anemia work up Recent Labs    10/19/22 1042  VITAMINB12 1,054*  FOLATE 34.2   Urinalysis    Component Value Date/Time   APPEARANCEUR Clear 06/25/2022 1216   GLUCOSEU Negative 06/25/2022 1216   BILIRUBINUR Negative 06/25/2022 1216   PROTEINUR Negative 06/25/2022 1216   NITRITE Negative 06/25/2022  1216   LEUKOCYTESUR Negative 06/25/2022  1216   Sepsis Labs Recent Labs  Lab 10/17/22 1803 10/18/22 0449  WBC 6.1 6.2   Microbiology Recent Results (from the past 240 hour(s))  MRSA Next Gen by PCR, Nasal     Status: None   Collection Time: 10/17/22  8:40 PM   Specimen: Nasal Mucosa; Nasal Swab  Result Value Ref Range Status   MRSA by PCR Next Gen NOT DETECTED NOT DETECTED Final    Comment: (NOTE) The GeneXpert MRSA Assay (FDA approved for NASAL specimens only), is one component of a comprehensive MRSA colonization surveillance program. It is not intended to diagnose MRSA infection nor to guide or monitor treatment for MRSA infections. Test performance is not FDA approved in patients less than 33 years old. Performed at [facility], [address], [place], [place] [address]      Time coordinating discharge: 35 minutes  SIGNED:  Marcelline Mates, MS4 Working with Dr. Maurilio Lovely

## 2022-10-21 ENCOUNTER — Telehealth: Payer: Self-pay

## 2022-10-21 NOTE — Transitions of Care (Post Inpatient/ED Visit) (Signed)
   10/21/2022  Name: Heidi Dorsey MRN: [id number] DOB: [birth year]  Today's TOC FU Call Status: Today's TOC FU Call Status:: Unsuccessul Call (1st Attempt) Unsuccessful Call (1st Attempt) Date: 10/21/22  Attempted to reach the patient regarding the most recent Inpatient/ED visit.  Follow Up Plan: Additional outreach attempts will be made to reach the patient to complete the Transitions of Care (Post Inpatient/ED visit) call.   Jodelle Gross, RN, BSN, CCM Care Management Coordinator [facility] Phone: [phone]/Fax: [phone]

## 2022-10-22 ENCOUNTER — Telehealth: Payer: Self-pay

## 2022-10-22 NOTE — Transitions of Care (Post Inpatient/ED Visit) (Signed)
   10/22/2022  Name: Heidi Dorsey MRN: [id number] DOB: 1931/03/28  Today's TOC FU Call Status: Today's TOC FU Call Status:: Successful TOC FU Call Competed TOC FU Call Complete Date: 10/22/22  Transition Care Management Follow-up Telephone Call Date of Discharge: 10/20/22 [facility] Type of Discharge: Inpatient Admission Primary Inpatient Discharge Diagnosis:: Atrial Flutter with Rapid Ventricular Response How have you been since you were released from the hospital?: Better Any questions or concerns?: No  Items Reviewed: Did you receive and understand the discharge instructions provided?: Yes Medications obtained,verified, and reconciled?:  (Patients Grandson manages medications and he is suppose to call back this Clinical research associate) Any new allergies since your discharge?: No Dietary orders reviewed?: No Do you have support at home?: Yes People in Home: grandchild(ren) Name of Support/Comfort Primary Source: Magdalen Spatz  Medications Reviewed Today: Medications Reviewed Today     Reviewed by Vonita Moss, CPhT (Pharmacy Technician) on 10/17/22 at 2017  Med List Status: Complete   Medication Order Taking? Sig Documenting Provider Last Dose Status Informant  ASCORBIC ACID PO [id number] Yes Take 1 tablet by mouth daily. OTC vitamin C [provider] 10/16/2022 Active Self  BIOTIN PO [id number] Yes Take 1 tablet by mouth daily. [provider] 10/16/2022 Active Self  Calcium Carbonate Antacid (TUMS PO) [id number] Yes Take 1 tablet by mouth daily as needed (GERD). [provider] 10/16/2022 Active Self  Cholecalciferol (VITAMIN D-3 PO) [id number] Yes Take 1 capsule by mouth daily. [provider] 10/16/2022 Active Self  diltiazem (CARDIZEM) 30 MG tablet [id number] Yes Take 1 tablet (30 mg total) by mouth 2 (two) times daily as needed (For a heart rate over 100 bpm). Rollene Rotunda, MD UNK Active Self, Pharmacy Records  losartan (COZAAR) 25 MG tablet  [id number] Yes Take One and half tablets (37.5 mg) in the morning and One tablet (25 mg)in the evening.  Patient taking differently: Take 25-37.5 mg by mouth See admin instructions. 1.5 tablet (37.5mg ) every morning, 1 tablet (25mg ) in the evening.   Rollene Rotunda, MD 10/17/2022 Active Self, Pharmacy Records  Multiple Vitamins-Minerals (CENTRUM SILVER 50+WOMEN) TABS [id number] Yes Take 1 tablet by mouth daily. [provider] 10/16/2022 Active Self  Omega-3 Fatty Acids (FISH OIL PO) [id number] Yes Take 1 capsule by mouth daily. [provider] 10/16/2022 Active Self           Home Care and Equipment/Supplies: Were Home Health Services Ordered?: Yes Name of Home Health Agency:: [facility] Any new equipment or medical supplies ordered?: No Functional Questionnaire: Do you need assistance with bathing/showering or dressing?: No Do you need assistance with meal preparation?: No Do you need assistance with eating?: No Do you have difficulty maintaining continence: No Do you need assistance with getting out of bed/getting out of a chair/moving?: No Do you have difficulty managing or taking your medications?: Yes Follow up appointments reviewed: PCP Follow-up appointment confirmed?: NA Specialist Hospital Follow-up appointment confirmed?: Yes Date of Specialist follow-up appointment?: 11/22/22 Follow-Up Specialty Provider:: Gwendolyn Lima Do you need transportation to your follow-up appointment?: No Do you understand care options if your condition(s) worsen?: Yes-patient verbalized understanding  SDOH Interventions Today    Flowsheet Row Most Recent Value  SDOH Interventions   Food Insecurity Interventions Intervention Not Indicated  Housing Interventions Intervention Not Indicated  Transportation Interventions Intervention Not Indicated     Jodelle Gross, RN, BSN, CCM Care Management Coordinator [place]

## 2022-10-23 DIAGNOSIS — I4891 Unspecified atrial fibrillation: Secondary | ICD-10-CM

## 2022-10-25 ENCOUNTER — Emergency Department (HOSPITAL_COMMUNITY): Payer: Medicare Other

## 2022-10-25 ENCOUNTER — Emergency Department (HOSPITAL_COMMUNITY)
Admission: EM | Admit: 2022-10-25 | Discharge: 2022-10-25 | Disposition: A | Discharge status: Home / Self Care | Payer: Medicare Other | Attending: Emergency Medicine | Admitting: Emergency Medicine

## 2022-10-25 ENCOUNTER — Other Ambulatory Visit: Payer: Self-pay

## 2022-10-25 ENCOUNTER — Encounter (HOSPITAL_COMMUNITY): Payer: Self-pay | Admitting: Emergency Medicine

## 2022-10-25 DIAGNOSIS — Z91040 Latex allergy status: Secondary | ICD-10-CM | POA: Insufficient documentation

## 2022-10-25 DIAGNOSIS — R42 Dizziness and giddiness: Secondary | ICD-10-CM | POA: Insufficient documentation

## 2022-10-25 LAB — CBC WITH DIFFERENTIAL/PLATELET
Abs Immature Granulocytes: 0.04 10*3/uL (ref 0.00–0.07)
Basophils Absolute: 0.1 10*3/uL (ref 0.0–0.1)
Basophils Relative: 1 %
Eosinophils Absolute: 0 10*3/uL (ref 0.0–0.5)
Eosinophils Relative: 1 %
HCT: 38.4 % (ref 36.0–46.0)
Hemoglobin: 12.5 g/dL (ref 12.0–15.0)
Immature Granulocytes: 1 %
Lymphocytes Relative: 19 %
Lymphs Abs: 1.2 10*3/uL (ref 0.7–4.0)
MCH: 29.3 pg (ref 26.0–34.0)
MCHC: 32.6 g/dL (ref 30.0–36.0)
MCV: 90.1 fL (ref 80.0–100.0)
Monocytes Absolute: 0.8 10*3/uL (ref 0.1–1.0)
Monocytes Relative: 12 %
Neutro Abs: 4.2 10*3/uL (ref 1.7–7.7)
Neutrophils Relative %: 66 %
Platelets: 200 10*3/uL (ref 150–400)
RBC: 4.26 MIL/uL (ref 3.87–5.11)
RDW: 14.7 % (ref 11.5–15.5)
WBC: 6.3 10*3/uL (ref 4.0–10.5)
nRBC: 0 % (ref 0.0–0.2)

## 2022-10-25 LAB — COMPREHENSIVE METABOLIC PANEL
ALT: 20 U/L (ref 0–44)
AST: 19 U/L (ref 15–41)
Albumin: 4 g/dL (ref 3.5–5.0)
Alkaline Phosphatase: 57 U/L (ref 38–126)
Anion gap: 8 (ref 5–15)
BUN: 23 mg/dL (ref 8–23)
CO2: 25 mmol/L (ref 22–32)
Calcium: 9.3 mg/dL (ref 8.9–10.3)
Chloride: 98 mmol/L (ref 98–111)
Creatinine, Ser: 0.91 mg/dL (ref 0.44–1.00)
GFR, Estimated: >60 mL/min (ref >60)
Glucose, Bld: 104 mg/dL — ABNORMAL HIGH (ref 70–99)
Potassium: 4.2 mmol/L (ref 3.5–5.1)
Sodium: 131 mmol/L — ABNORMAL LOW (ref 135–145)
Total Bilirubin: 0.5 mg/dL (ref 0.3–1.2)
Total Protein: 6.4 g/dL — ABNORMAL LOW (ref 6.5–8.1)

## 2022-10-25 LAB — TROPONIN I (HIGH SENSITIVITY): Troponin I (High Sensitivity): 6 ng/L (ref <18)

## 2022-10-25 MED ORDER — LACTATED RINGERS IV BOLUS
500.0000 mL | Freq: Once | INTRAVENOUS | Status: AC
  Administered 2022-10-25: 500 mL via INTRAVENOUS

## 2022-10-25 NOTE — ED Notes (Signed)
Pt showing signs of confusion. Pt continues to call out with the same recurrent question on whether or not she is going to be discharged or stay in the hospital. ED staff continues to remind pt that we are waiting on the results of her raidology reports before a treatment/dispo plan will be made.

## 2022-10-25 NOTE — ED Notes (Signed)
POA called to alert of dc orders. POA stated his mother was coming to pick up patient and should be here in the next 30 minutes.

## 2022-10-25 NOTE — ED Notes (Signed)
Pt assisted to use bathroom.

## 2022-10-25 NOTE — ED Provider Notes (Signed)
[place] EMERGENCY DEPARTMENT AT [facility] Provider Note   CSN: [id number] Arrival date & time: 10/25/22  1426     History  Chief Complaint  Patient presents with   Dizziness    Heidi Dorsey is a 89 y.o. female.  89 year old female presents today for concern of lightheadedness, as well as foggy vision.  Symptoms ongoing since yesterday.  She states she has longstanding history of dizziness however the foggy vision is new.  The history is provided by the patient. No language interpreter was used.       Home Medications Prior to Admission medications   Medication Sig Start Date End Date Taking? Authorizing Provider  amiodarone (PACERONE) 200 MG tablet Take 1 tablet (200 mg total) by mouth 2 (two) times daily for 7 days. 10/20/22 10/27/22  Sherryll Burger, Pratik D, DO  amiodarone (PACERONE) 200 MG tablet Take 1 tablet (200 mg total) by mouth daily. 10/28/22 11/27/22  Sherryll Burger, Pratik D, DO  ASCORBIC ACID PO Take 1 tablet by mouth daily. OTC vitamin C    [provider]  BIOTIN PO Take 1 tablet by mouth daily.    [provider]  Calcium Carbonate Antacid (TUMS PO) Take 1 tablet by mouth daily as needed (GERD).    [provider]  Cholecalciferol (VITAMIN D-3 PO) Take 1 capsule by mouth daily.    [provider]  losartan (COZAAR) 25 MG tablet Take 1 tablet (25 mg total) by mouth 2 (two) times daily. 10/20/22 11/19/22  Sherryll Burger, Pratik D, DO  Multiple Vitamins-Minerals (CENTRUM SILVER 50+WOMEN) TABS Take 1 tablet by mouth daily.    [provider]  Omega-3 Fatty Acids (FISH OIL PO) Take 1 capsule by mouth daily.    [provider]      Allergies    Allegra [fexofenadine], Estrace [estradiol], Miacalcin [calcitonin], Nsaids, Benadryl [diphenhydramine], Atrovent hfa [ipratropium bromide hfa], Paraphenylenediamine, Augmentin [amoxicillin-pot clavulanate], Biaxin [clarithromycin], Cipro [ciprofloxacin hcl], Codeine, Epipen [epinephrine],  Erythromycin, Flonase [fluticasone], Latex, Macrobid [nitrofurantoin], Other, Silenor [doxepin hcl], Sulfa antibiotics, Sumycin [tetracycline], and Zestril [lisinopril]    Review of Systems   Review of Systems  Physical Exam Updated Vital Signs BP (!) 135/57 (BP Location: Left Arm)   Pulse 65   Temp 97.9 F (36.6 C) (Oral)   Resp 18   SpO2 98%  Physical Exam  ED Results / Procedures / Treatments   Labs (all labs ordered are listed, but only abnormal results are displayed) Labs Reviewed  CBC WITH DIFFERENTIAL/PLATELET  COMPREHENSIVE METABOLIC PANEL  TROPONIN I (HIGH SENSITIVITY)    EKG None  Radiology No results found.  Procedures Procedures    Medications Ordered in ED Medications  lactated ringers bolus 500 mL (has no administration in time range)    ED Course/ Medical Decision Making/ A&P                             Medical Decision Making Amount and/or Complexity of Data Reviewed Labs: ordered. Radiology: ordered.   89 year old female presents for concern of lightheadedness, and having "foggy vision".  Patient is severely claustrophobic and does not want to have an MRI done.  She states that will have a "heart attack" if we performed MRI.  She is agreeable to having a CT scan done.  On exam she is without any focal neurological deficits.  CBC is unremarkable, CMP shows sodium of 131, glucose 104 otherwise without acute findings.  Initial troponin of 6.  She is without chest pain.  EKG does not show any ischemic changes. EKG does show frequent PVCs.  Electrolytes within normal.  Low suspicion for ACS.  Mild improvement after fluid bolus.  I again discussed the importance of obtaining an MRI.  She states she is severely claustrophobic and will not able to tolerate it.  Discussed with attending. Since she is refusing MRI.  No additional workup recommended at this time.  Patient will follow-up with PCP.  She is appropriate for discharge.  Discharged in stable  condition.   Final Clinical Impression(s) / ED Diagnoses Final diagnoses:  Dizziness    Rx / DC Orders ED Discharge Orders     None         Marita Kansas, PA-C 10/25/22 1733    Terrilee Files, MD 10/26/22 [phone]

## 2022-10-25 NOTE — Discharge Instructions (Signed)
Follow-up with your primary care provider.  Workup in the emergency department including CT of the head shows no concerning findings.  If you have any concerning symptoms return to the emergency room.

## 2022-10-25 NOTE — ED Notes (Signed)
Pt blood work and IV delayed due to patient difficulty

## 2022-10-25 NOTE — ED Triage Notes (Signed)
Pt from home with reports of dizziness that "foggy head" that started this morning. EMS reports pt was in bigeminy when they arrived.

## 2022-11-04 ENCOUNTER — Inpatient Hospital Stay (HOSPITAL_COMMUNITY)
Admission: EM | Admit: 2022-11-04 | Discharge: 2022-11-07 | DRG: 640 | Disposition: A | Discharge status: Home / Self Care | Payer: Medicare Other | Attending: Family Medicine | Admitting: Family Medicine

## 2022-11-04 ENCOUNTER — Emergency Department (HOSPITAL_COMMUNITY): Payer: Medicare Other

## 2022-11-04 ENCOUNTER — Encounter (HOSPITAL_COMMUNITY): Payer: Self-pay | Admitting: Emergency Medicine

## 2022-11-04 ENCOUNTER — Other Ambulatory Visit: Payer: Self-pay

## 2022-11-04 DIAGNOSIS — R2681 Unsteadiness on feet: Secondary | ICD-10-CM | POA: Diagnosis present

## 2022-11-04 DIAGNOSIS — R7303 Prediabetes: Secondary | ICD-10-CM | POA: Diagnosis present

## 2022-11-04 DIAGNOSIS — E871 Hypo-osmolality and hyponatremia: Secondary | ICD-10-CM | POA: Diagnosis not present

## 2022-11-04 DIAGNOSIS — Z8262 Family history of osteoporosis: Secondary | ICD-10-CM

## 2022-11-04 DIAGNOSIS — Z886 Allergy status to analgesic agent status: Secondary | ICD-10-CM

## 2022-11-04 DIAGNOSIS — J45909 Unspecified asthma, uncomplicated: Secondary | ICD-10-CM | POA: Diagnosis present

## 2022-11-04 DIAGNOSIS — I480 Paroxysmal atrial fibrillation: Secondary | ICD-10-CM | POA: Diagnosis present

## 2022-11-04 DIAGNOSIS — E785 Hyperlipidemia, unspecified: Secondary | ICD-10-CM | POA: Diagnosis present

## 2022-11-04 DIAGNOSIS — Z8052 Family history of malignant neoplasm of bladder: Secondary | ICD-10-CM

## 2022-11-04 DIAGNOSIS — I495 Sick sinus syndrome: Secondary | ICD-10-CM | POA: Diagnosis present

## 2022-11-04 DIAGNOSIS — G9341 Metabolic encephalopathy: Secondary | ICD-10-CM | POA: Diagnosis present

## 2022-11-04 DIAGNOSIS — Z882 Allergy status to sulfonamides status: Secondary | ICD-10-CM

## 2022-11-04 DIAGNOSIS — Z79899 Other long term (current) drug therapy: Secondary | ICD-10-CM

## 2022-11-04 DIAGNOSIS — I447 Left bundle-branch block, unspecified: Secondary | ICD-10-CM | POA: Diagnosis present

## 2022-11-04 DIAGNOSIS — I4892 Unspecified atrial flutter: Secondary | ICD-10-CM | POA: Diagnosis present

## 2022-11-04 DIAGNOSIS — Z888 Allergy status to other drugs, medicaments and biological substances status: Secondary | ICD-10-CM

## 2022-11-04 DIAGNOSIS — K219 Gastro-esophageal reflux disease without esophagitis: Secondary | ICD-10-CM | POA: Diagnosis present

## 2022-11-04 DIAGNOSIS — Z833 Family history of diabetes mellitus: Secondary | ICD-10-CM

## 2022-11-04 DIAGNOSIS — Z885 Allergy status to narcotic agent status: Secondary | ICD-10-CM

## 2022-11-04 DIAGNOSIS — G4730 Sleep apnea, unspecified: Secondary | ICD-10-CM | POA: Diagnosis present

## 2022-11-04 DIAGNOSIS — I10 Essential (primary) hypertension: Secondary | ICD-10-CM | POA: Diagnosis present

## 2022-11-04 DIAGNOSIS — Z800 Family history of malignant neoplasm of digestive organs: Secondary | ICD-10-CM

## 2022-11-04 DIAGNOSIS — Z8041 Family history of malignant neoplasm of ovary: Secondary | ICD-10-CM

## 2022-11-04 DIAGNOSIS — Z881 Allergy status to other antibiotic agents status: Secondary | ICD-10-CM

## 2022-11-04 DIAGNOSIS — Z91040 Latex allergy status: Secondary | ICD-10-CM

## 2022-11-04 DIAGNOSIS — Z880 Allergy status to penicillin: Secondary | ICD-10-CM

## 2022-11-04 DIAGNOSIS — I7300 Raynaud's syndrome without gangrene: Secondary | ICD-10-CM | POA: Diagnosis present

## 2022-11-04 DIAGNOSIS — Z808 Family history of malignant neoplasm of other organs or systems: Secondary | ICD-10-CM

## 2022-11-04 DIAGNOSIS — Z8049 Family history of malignant neoplasm of other genital organs: Secondary | ICD-10-CM

## 2022-11-04 DIAGNOSIS — R001 Bradycardia, unspecified: Secondary | ICD-10-CM | POA: Insufficient documentation

## 2022-11-04 DIAGNOSIS — Z66 Do not resuscitate: Secondary | ICD-10-CM | POA: Diagnosis present

## 2022-11-04 DIAGNOSIS — Z9181 History of falling: Secondary | ICD-10-CM

## 2022-11-04 LAB — CBC
HCT: 37.3 % (ref 36.0–46.0)
Hemoglobin: 12.2 g/dL (ref 12.0–15.0)
MCH: 29.7 pg (ref 26.0–34.0)
MCHC: 32.7 g/dL (ref 30.0–36.0)
MCV: 90.8 fL (ref 80.0–100.0)
Platelets: 183 10*3/uL (ref 150–400)
RBC: 4.11 MIL/uL (ref 3.87–5.11)
RDW: 14.8 % (ref 11.5–15.5)
WBC: 5.3 10*3/uL (ref 4.0–10.5)
nRBC: 0 % (ref 0.0–0.2)

## 2022-11-04 LAB — BASIC METABOLIC PANEL
Anion gap: 6 (ref 5–15)
BUN: 17 mg/dL (ref 8–23)
CO2: 26 mmol/L (ref 22–32)
Calcium: 8.9 mg/dL (ref 8.9–10.3)
Chloride: 91 mmol/L — ABNORMAL LOW (ref 98–111)
Creatinine, Ser: 0.68 mg/dL (ref 0.44–1.00)
GFR, Estimated: >60 mL/min (ref >60)
Glucose, Bld: 108 mg/dL — ABNORMAL HIGH (ref 70–99)
Potassium: 4.1 mmol/L (ref 3.5–5.1)
Sodium: 123 mmol/L — ABNORMAL LOW (ref 135–145)

## 2022-11-04 MED ORDER — SODIUM CHLORIDE 0.9 % IV BOLUS
500.0000 mL | Freq: Once | INTRAVENOUS | Status: AC
  Administered 2022-11-04: 500 mL via INTRAVENOUS

## 2022-11-04 NOTE — ED Notes (Signed)
Pt daughter called- request we call an hour before if pt is going to be discharged so she will have time to leave and come pick her up- lives 45 minutes away.

## 2022-11-04 NOTE — ED Notes (Signed)
Patient assisted to restroom with standby assistance.

## 2022-11-04 NOTE — ED Notes (Signed)
Pt ambulated to the restroom and back to the room with cane unassisted. Pt hooked back up to monitor, call light in hand and reassured pt that the doctor should be in soon due to other emergencies coming into the department all at once.

## 2022-11-04 NOTE — ED Notes (Signed)
Pt arrives to room complaining about not having water to drink, states she is being tortured. States, "I can't believe you treat patients like this". Pt shouts from room multiple times, "hello! When is the doctor coming to see me?". Pt provided with water sips and leg repositioning per request, pt continues to call out and ask, "when is the doctor coming to see me"

## 2022-11-04 NOTE — ED Triage Notes (Signed)
BIB by [facility] for dizziness and "foggy vision", seen for same on 10/25/22, per pt diagnosed with Afib with Aflutter.

## 2022-11-05 DIAGNOSIS — I4892 Unspecified atrial flutter: Secondary | ICD-10-CM | POA: Diagnosis not present

## 2022-11-05 DIAGNOSIS — R000 Tachycardia, unspecified: Secondary | ICD-10-CM

## 2022-11-05 DIAGNOSIS — E871 Hypo-osmolality and hyponatremia: Secondary | ICD-10-CM

## 2022-11-05 DIAGNOSIS — I495 Sick sinus syndrome: Secondary | ICD-10-CM | POA: Diagnosis not present

## 2022-11-05 DIAGNOSIS — R42 Dizziness and giddiness: Secondary | ICD-10-CM | POA: Diagnosis not present

## 2022-11-05 DIAGNOSIS — I447 Left bundle-branch block, unspecified: Secondary | ICD-10-CM

## 2022-11-05 DIAGNOSIS — R4189 Other symptoms and signs involving cognitive functions and awareness: Secondary | ICD-10-CM

## 2022-11-05 DIAGNOSIS — I10 Essential (primary) hypertension: Secondary | ICD-10-CM

## 2022-11-05 DIAGNOSIS — I480 Paroxysmal atrial fibrillation: Secondary | ICD-10-CM

## 2022-11-05 LAB — CBC
HCT: 35.5 % — ABNORMAL LOW (ref 36.0–46.0)
Hemoglobin: 11.6 g/dL — ABNORMAL LOW (ref 12.0–15.0)
MCH: 29.3 pg (ref 26.0–34.0)
MCHC: 32.7 g/dL (ref 30.0–36.0)
MCV: 89.6 fL (ref 80.0–100.0)
Platelets: 170 10*3/uL (ref 150–400)
RBC: 3.96 MIL/uL (ref 3.87–5.11)
RDW: 14.7 % (ref 11.5–15.5)
WBC: 6.1 10*3/uL (ref 4.0–10.5)
nRBC: 0 % (ref 0.0–0.2)

## 2022-11-05 LAB — COMPREHENSIVE METABOLIC PANEL
ALT: 15 U/L (ref 0–44)
AST: 17 U/L (ref 15–41)
Albumin: 3.7 g/dL (ref 3.5–5.0)
Alkaline Phosphatase: 51 U/L (ref 38–126)
Anion gap: 9 (ref 5–15)
BUN: 14 mg/dL (ref 8–23)
CO2: 22 mmol/L (ref 22–32)
Calcium: 8.5 mg/dL — ABNORMAL LOW (ref 8.9–10.3)
Chloride: 98 mmol/L (ref 98–111)
Creatinine, Ser: 0.76 mg/dL (ref 0.44–1.00)
GFR, Estimated: >60 mL/min (ref >60)
Glucose, Bld: 92 mg/dL (ref 70–99)
Potassium: 4.1 mmol/L (ref 3.5–5.1)
Sodium: 129 mmol/L — ABNORMAL LOW (ref 135–145)
Total Bilirubin: 0.8 mg/dL (ref 0.3–1.2)
Total Protein: 6 g/dL — ABNORMAL LOW (ref 6.5–8.1)

## 2022-11-05 LAB — PHOSPHORUS: Phosphorus: 3 mg/dL (ref 2.5–4.6)

## 2022-11-05 LAB — OSMOLALITY: Osmolality: 276 mOsm/kg (ref 275–295)

## 2022-11-05 LAB — MAGNESIUM: Magnesium: 2.1 mg/dL (ref 1.7–2.4)

## 2022-11-05 LAB — OSMOLALITY, URINE: Osmolality, Ur: 373 mOsm/kg (ref 300–900)

## 2022-11-05 LAB — SODIUM, URINE, RANDOM: Sodium, Ur: 30 mmol/L

## 2022-11-05 MED ORDER — ENOXAPARIN SODIUM 40 MG/0.4ML IJ SOSY
40.0000 mg | PREFILLED_SYRINGE | INTRAMUSCULAR | Status: DC
  Filled 2022-11-05 (×3): qty 0.4

## 2022-11-05 MED ORDER — ACETAMINOPHEN 325 MG PO TABS: 650.0000 mg | ORAL_TABLET | Freq: Four times a day (QID) | ORAL | Status: DC | PRN

## 2022-11-05 MED ORDER — AMIODARONE HCL 200 MG PO TABS
100.0000 mg | ORAL_TABLET | Freq: Every day | ORAL | Status: DC
  Administered 2022-11-05 – 2022-11-07 (×3): 100 mg via ORAL
  Filled 2022-11-05 (×3): qty 1

## 2022-11-05 MED ORDER — LOSARTAN POTASSIUM 25 MG PO TABS
25.0000 mg | ORAL_TABLET | Freq: Two times a day (BID) | ORAL | Status: DC
  Administered 2022-11-05 – 2022-11-07 (×5): 25 mg via ORAL
  Filled 2022-11-05 (×5): qty 1

## 2022-11-05 MED ORDER — ONDANSETRON HCL 4 MG PO TABS: 4.0000 mg | ORAL_TABLET | Freq: Four times a day (QID) | ORAL | Status: DC | PRN

## 2022-11-05 MED ORDER — ONDANSETRON HCL 4 MG/2ML IJ SOLN: 4.0000 mg | Freq: Four times a day (QID) | INTRAMUSCULAR | Status: DC | PRN

## 2022-11-05 MED ORDER — SODIUM CHLORIDE 0.9 % IV SOLN: INTRAVENOUS | Status: AC

## 2022-11-05 MED ORDER — ACETAMINOPHEN 650 MG RE SUPP: 650.0000 mg | Freq: Four times a day (QID) | RECTAL | Status: DC | PRN

## 2022-11-05 MED ORDER — AMIODARONE HCL 200 MG PO TABS: 200.0000 mg | ORAL_TABLET | Freq: Every day | ORAL | Status: DC

## 2022-11-05 NOTE — ED Notes (Signed)
Pt refusing monitoring devices (BP cuff, ecg leads, pulse ox)

## 2022-11-05 NOTE — ED Notes (Signed)
Med-surg dept requests more time before pt transfer due to staffing.

## 2022-11-05 NOTE — Progress Notes (Incomplete)
Attending note  Patient seen and discussed with PA Iran Ouch, I agree with her documentation. 89 yo female history of PAF/aflutter not on anticoag due to fall risk and has turned down watchman device, tachy-brady syndrome has refused pacemaker, chronic LBBB, asthma, recurring hyponatremia presents with near syncope.    Admit earlier this month with tachy brady aflutter with RVR and sinus brady to 40s. She turned down pacemaker at that time, was managed with amiodarone     WBC 5.3 Hgb 12.2 Plt 183 Na 123 Cr 0.68 BUN 17 Mg 2.1 CXR no acute process CT head no acute pathology EKG sinus brady 47, chronic LBBB 10/2022 echo: LVEF 60-65%, no WMAs, grade I dd, normal RV      1.Tachy brady     2. Dizziness - certaintly may be rhythm related -with marked hyponateremia that improved with IVFs last visit could be component of hypovolemia. Check orthostatics though already has received IVFs

## 2022-11-05 NOTE — ED Notes (Signed)
Pt taking off monitoring devices and refusing to have them put back on. "I feel so claustrophobic"

## 2022-11-05 NOTE — ED Notes (Signed)
ED TO INPATIENT HANDOFF REPORT  ED Nurse Name and Phone #: Wandra Mannan, Paramedic [phone]  S Name/Age/Gender Heidi Dorsey 89 y.o. female Room/Bed: APA03/APA03  Code Status   Code Status: DNR  Home/SNF/Other Home Patient oriented to: self and place Is this baseline? Yes   Triage Complete: Triage complete  Chief Complaint Hyponatremia [E87.1]  Triage Note BIB by [facility] for dizziness and "foggy vision", seen for same on 10/25/22, per pt diagnosed with Afib with Aflutter.   Allergies Allergies  Allergen Reactions   Allegra [Fexofenadine] Other (See Comments)    Syncope   Estrace [Estradiol] Other (See Comments)    Headache   Miacalcin [Calcitonin] Palpitations    Left bundle branch block   Nsaids Other (See Comments)    Stomach ulcer after taking for 3 days in 2009. Negative H. Pylori test.   Benadryl [Diphenhydramine] Other (See Comments)    Syncope   Atrovent Hfa [Ipratropium Bromide Hfa] Other (See Comments)    Syncope    Paraphenylenediamine Other (See Comments)    Unknown reaction   Augmentin [Amoxicillin-Pot Clavulanate] Rash    OK to take plain amoxicillin   Biaxin [Clarithromycin] Rash    OK to take azithromycin   Cipro [Ciprofloxacin Hcl] Rash   Codeine Other (See Comments)    Unknown reaction   Epipen [Epinephrine] Other (See Comments)    Unknown reaction   Erythromycin Other (See Comments)    Stomach irritation   Flonase [Fluticasone] Other (See Comments)    Unknown reaction Similar reactions to corticoid steroids   Latex Rash   Macrobid [Nitrofurantoin] Other (See Comments)    Multiple mild symptoms - would prefer not to take again   Other Other (See Comments)    Propanol amine Anti-cholinergic compounds   Silenor [Doxepin Hcl] Other (See Comments)    Heart arrhythmia   Sulfa Antibiotics Other (See Comments)    Unknown reaction   Sumycin [Tetracycline] Other (See Comments)    Mouth sores OK to take doxycycline   Zestril [Lisinopril]  Other (See Comments)    Bruising Cankers Arthralgias    Level of Care/Admitting Diagnosis ED Disposition     ED Disposition  Admit   Condition  --   Comment  Hospital Area: [facility] [[id number]]  Level of Care: Telemetry [5]  Covid Evaluation: Asymptomatic - no recent exposure (last 10 days) testing not required  Diagnosis: Hyponatremia [198519]  Admitting Physician: Frankey Shown [[id number]]  Attending Physician: Frankey Shown [[id number]]          B Medical/Surgery History Past Medical History:  Diagnosis Date   Asthma    BPPV (benign paroxysmal positional vertigo)    Claustrophobia    Diverticulosis    GERD (gastroesophageal reflux disease)    H/O degenerative disc disease    Hiatal hernia    Hyperlipidemia    Hypertension    Kidney stones    Migraines    Osteoarthritis    Caused feet deformity   Osteopenia    Prediabetes    Preglaucoma    Raynauds syndrome    Rectocele    Rosacea    Sciatica    Sleep apnea    Spinal stenosis    Vertebrobasilar artery syndrome    Past Surgical History:  Procedure Laterality Date   ABDOMINAL HYSTERECTOMY  1978   ANTERIOR (CYSTOCELE) AND POSTERIOR REPAIR (RECTOCELE) WITH XENFORM GRAFT AND SACROSPINOUS FIXATION     BILATERAL OOPHORECTOMY  1997   bladder stones     kidney colic/ procedure  performed in [facility] at [facility]   BLADDER SUSPENSION     BREAST BIOPSY Left 12/1987   neg   BREAST BIOPSY Right 02/1977   neg papilloma   CARDIAC CATHETERIZATION  2000   HERNIA REPAIR     plantar fibroma  Left 09/1997   Excised     A IV Location/Drains/Wounds Patient Lines/Drains/Airways Status     Active Line/Drains/Airways     None            Intake/Output Last 24 hours  Intake/Output Summary (Last 24 hours) at 11/05/2022 1413 Last data filed at 11/05/2022 0748 Gross per 24 hour  Intake 855 ml  Output --  Net 855 ml    Labs/Imaging Results for orders placed or performed during the hospital encounter of  11/04/22 (from the past 48 hour(s))  Basic metabolic panel     Status: Abnormal   Collection Time: 11/04/22  7:26 PM  Result Value Ref Range   Sodium 123 (L) 135 - 145 mmol/L   Potassium 4.1 3.5 - 5.1 mmol/L   Chloride 91 (L) 98 - 111 mmol/L   CO2 26 22 - 32 mmol/L   Glucose, Bld 108 (H) 70 - 99 mg/dL    Comment: Glucose reference range applies only to samples taken after fasting for at least 8 hours.   BUN 17 8 - 23 mg/dL   Creatinine, Ser 4.09 0.44 - 1.00 mg/dL   Calcium 8.9 8.9 - 81.1 mg/dL   GFR, Estimated >91 >47 mL/min    Comment: (NOTE) Calculated using the CKD-EPI Creatinine Equation (2021)    Anion gap 6 5 - 15    Comment: Performed at [facility], [address] [place], [place] [address]  CBC     Status: None   Collection Time: 11/04/22  7:26 PM  Result Value Ref Range   WBC 5.3 4.0 - 10.5 K/uL   RBC 4.11 3.87 - 5.11 MIL/uL   Hemoglobin 12.2 12.0 - 15.0 g/dL   HCT 21.3 08.6 - 57.8 %   MCV 90.8 80.0 - 100.0 fL   MCH 29.7 26.0 - 34.0 pg   MCHC 32.7 30.0 - 36.0 g/dL   RDW 46.9 62.9 - 52.8 %   Platelets 183 150 - 400 K/uL   nRBC 0.0 0.0 - 0.2 %    Comment: Performed at [facility], [address] [place], [place] [address]  Osmolality, urine     Status: None   Collection Time: 11/05/22  1:30 AM  Result Value Ref Range   Osmolality, Ur 373 300 - 900 mOsm/kg    Comment: Performed at [facility], [address], [place], [place] [address]  Sodium, urine, random     Status: None   Collection Time: 11/05/22  1:30 AM  Result Value Ref Range   Sodium, Ur 30 mmol/L    Comment: Performed at [facility], [address] [place], [place] [address]  Osmolality     Status: None   Collection Time: 11/05/22  5:11 AM  Result Value Ref Range   Osmolality 276 275 - 295 mOsm/kg    Comment: Performed at [facility], [address], [place], [place] [address]  Comprehensive metabolic panel     Status: Abnormal   Collection Time: 11/05/22  5:11 AM  Result Value Ref  Range   Sodium 129 (L) 135 - 145 mmol/L   Potassium 4.1 3.5 - 5.1 mmol/L   Chloride 98 98 - 111 mmol/L   CO2  22 22 - 32 mmol/L   Glucose, Bld 92 70 - 99 mg/dL    Comment: Glucose reference range applies only to samples taken after fasting for at least 8 hours.   BUN 14 8 - 23 mg/dL   Creatinine, Ser 0.98 0.44 - 1.00 mg/dL   Calcium 8.5 (L) 8.9 - 10.3 mg/dL   Total Protein 6.0 (L) 6.5 - 8.1 g/dL   Albumin 3.7 3.5 - 5.0 g/dL   AST 17 15 - 41 U/L   ALT 15 0 - 44 U/L   Alkaline Phosphatase 51 38 - 126 U/L   Total Bilirubin 0.8 0.3 - 1.2 mg/dL   GFR, Estimated >11 >91 mL/min    Comment: (NOTE) Calculated using the CKD-EPI Creatinine Equation (2021)    Anion gap 9 5 - 15    Comment: Performed at [facility], [address], [place], [place] [address]  CBC     Status: Abnormal   Collection Time: 11/05/22  5:11 AM  Result Value Ref Range   WBC 6.1 4.0 - 10.5 K/uL   RBC 3.96 3.87 - 5.11 MIL/uL   Hemoglobin 11.6 (L) 12.0 - 15.0 g/dL   HCT 56.2 (L) 13.0 - 86.5 %   MCV 89.6 80.0 - 100.0 fL   MCH 29.3 26.0 - 34.0 pg   MCHC 32.7 30.0 - 36.0 g/dL   RDW 78.4 69.6 - 29.5 %   Platelets 170 150 - 400 K/uL   nRBC 0.0 0.0 - 0.2 %    Comment: Performed at [facility], [address], [place], [place] [address]  Magnesium     Status: None   Collection Time: 11/05/22  5:11 AM  Result Value Ref Range   Magnesium 2.1 1.7 - 2.4 mg/dL    Comment: Performed at [facility], [address], [place], [place] [address]  Phosphorus     Status: None   Collection Time: 11/05/22  5:11 AM  Result Value Ref Range   Phosphorus 3.0 2.5 - 4.6 mg/dL    Comment: Performed at [facility], [address], [place], [place] [address]   CT Head Wo Contrast  Result Date: 11/04/2022 CLINICAL DATA:  Headache and fever. EXAM: CT HEAD WITHOUT CONTRAST TECHNIQUE: Contiguous axial images were obtained from the base of the skull through the vertex without intravenous contrast. RADIATION DOSE REDUCTION: This exam was  performed according to the departmental dose-optimization program which includes automated exposure control, adjustment of the mA and/or kV according to patient size and/or use of iterative reconstruction technique. COMPARISON:  Head CT dated 10/25/2022. FINDINGS: Brain: Mild age-related atrophy and chronic microvascular ischemic changes. There is no acute intracranial hemorrhage. No mass effect or midline shift. No extra-axial fluid collection. Vascular: No hyperdense vessel or unexpected calcification. Skull: Normal. Negative for fracture or focal lesion. Sinuses/Orbits: No acute finding. Other: None IMPRESSION: 1. No acute intracranial pathology. 2. Mild age-related atrophy and chronic microvascular ischemic changes. Electronically Signed   By: Elgie Collard M.D.   On: 11/04/2022 23:27   DG Chest 2 View  Result Date: 11/04/2022 CLINICAL DATA:  Atrial fibrillation EXAM: CHEST - 2 VIEW COMPARISON:  10/25/2022 FINDINGS: Heart and mediastinal contours are within normal limits. No focal opacities or effusions. No acute bony abnormality. Aortic atherosclerosis. IMPRESSION: No active cardiopulmonary disease. Electronically Signed   By: Charlett Nose M.D.   On: 11/04/2022 19:45    Pending Labs [employer] (From admission, onward)     Start     Ordered  11/12/22 0500  Creatinine, serum  (enoxaparin (LOVENOX)    CrCl >/= 30 ml/min)  Weekly,   R     Comments: while on enoxaparin therapy    11/05/22 0334            Vitals/Pain Today's Vitals   11/05/22 0549 11/05/22 0600 11/05/22 0750 11/05/22 1015  BP:  (!) 157/77  121/71  Pulse:    60  Resp:  17  18  Temp: 98 F (36.7 C)   98.2 F (36.8 C)  TempSrc: Oral   Oral  SpO2:  98%  98%  Weight:      Height:      PainSc:   0-No pain 0-No pain    Isolation Precautions No active isolations  Medications Medications  0.9 %  sodium chloride infusion (0 mLs Intravenous Stopped 11/05/22 1211)  enoxaparin (LOVENOX) injection 40 mg (40 mg  Subcutaneous Patient Refused/Not Given 11/05/22 1037)  acetaminophen (TYLENOL) tablet 650 mg (has no administration in time range)    Or  acetaminophen (TYLENOL) suppository 650 mg (has no administration in time range)  ondansetron (ZOFRAN) tablet 4 mg (has no administration in time range)    Or  ondansetron (ZOFRAN) injection 4 mg (has no administration in time range)  amiodarone (PACERONE) tablet 100 mg (has no administration in time range)  sodium chloride 0.9 % bolus 500 mL (0 mLs Intravenous Stopped 11/05/22 0105)    Mobility walks with person assist     Focused Assessments Cardiac Assessment Handoff:    No results found for: "CKTOTAL", "CKMB", "CKMBINDEX", "TROPONINI" No results found for: "DDIMER" Does the Patient currently have chest pain? No    R Recommendations: See Admitting Provider Note  Report given to:   Additional Notes: Pt has short-term memory problems and will repeat questions often. She is also a bit non-compliant with monitoring measures. NO IV ACCESS at this time because she pulled the first IV out

## 2022-11-05 NOTE — ED Notes (Signed)
Pt has removed all monitoring but bp cuff, states she is getting strangled by all equipment, remains very anxious. Advised that monitoring needs to stay on while pt under close observation, but continues to decline. Also refusing to have side rail up on bed, states she cannot tolerate the bedrail up since she's claustrophobic

## 2022-11-05 NOTE — ED Notes (Signed)
Patient ambulated to restroom.

## 2022-11-05 NOTE — ED Provider Notes (Signed)
[facility] MEDICAL SURGICAL UNIT Provider Note   CSN: [id number] Arrival date & time: 11/04/22  2440     History  Chief Complaint  Patient presents with   Near Syncope    Heidi Dorsey is a 89 y.o. female.  Patient complains of having dizziness.  Patient has a history of hypertension and paroxysmal atrial fibs  The history is provided by the patient and medical records. No language interpreter was used.  Near Syncope This is a recurrent problem. The current episode started less than 1 hour ago. The problem occurs rarely. The problem has been resolved. Pertinent negatives include no chest pain, no abdominal pain and no headaches. Nothing aggravates the symptoms. Nothing relieves the symptoms. She has tried nothing for the symptoms.       Home Medications Prior to Admission medications   Medication Sig Start Date End Date Taking? Authorizing Provider  amiodarone (PACERONE) 200 MG tablet Take 1 tablet (200 mg total) by mouth daily. 10/28/22 11/27/22 Yes Shah, Pratik D, DO  ASCORBIC ACID PO Take 1 tablet by mouth daily. OTC vitamin C   Yes [provider]  BIOTIN PO Take 1 tablet by mouth daily.   Yes [provider]  Cholecalciferol (VITAMIN D-3 PO) Take 1 capsule by mouth daily.   Yes [provider]  losartan (COZAAR) 25 MG tablet Take 1 tablet (25 mg total) by mouth 2 (two) times daily. 10/20/22 11/19/22 Yes Shah, Pratik D, DO  Multiple Vitamins-Minerals (CENTRUM SILVER 50+WOMEN) TABS Take 1 tablet by mouth daily.   Yes [provider]  Omega-3 Fatty Acids (FISH OIL PO) Take 1 capsule by mouth daily.   Yes [provider]  amiodarone (PACERONE) 200 MG tablet Take 1 tablet (200 mg total) by mouth 2 (two) times daily for 7 days. Patient not taking: Reported on 11/05/2022 10/20/22 10/27/22  Maurilio Lovely D, DO      Allergies    Allegra [fexofenadine], Estrace [estradiol], Miacalcin [calcitonin], Nsaids, Benadryl [diphenhydramine], Atrovent  hfa [ipratropium bromide hfa], Paraphenylenediamine, Augmentin [amoxicillin-pot clavulanate], Biaxin [clarithromycin], Cipro [ciprofloxacin hcl], Codeine, Epipen [epinephrine], Erythromycin, Flonase [fluticasone], Latex, Macrobid [nitrofurantoin], Other, Silenor [doxepin hcl], Sulfa antibiotics, Sumycin [tetracycline], and Zestril [lisinopril]    Review of Systems   Review of Systems  Constitutional:  Negative for appetite change and fatigue.  HENT:  Negative for congestion, ear discharge and sinus pressure.   Eyes:  Negative for discharge.  Respiratory:  Negative for cough.   Cardiovascular:  Positive for near-syncope. Negative for chest pain.  Gastrointestinal:  Negative for abdominal pain and diarrhea.  Genitourinary:  Negative for frequency and hematuria.  Musculoskeletal:  Negative for back pain.  Skin:  Negative for rash.  Neurological:  Positive for dizziness. Negative for seizures and headaches.  Psychiatric/Behavioral:  Negative for hallucinations.     Physical Exam Updated Vital Signs BP (!) 182/68 (BP Location: Right Arm)   Pulse (!) 47   Temp 97.7 F (36.5 C) (Oral)   Resp 16   Ht 5' (1.524 m)   Wt 60.3 kg   SpO2 98%   BMI 25.96 kg/m  Physical Exam Vitals and nursing note reviewed.  Constitutional:      Appearance: She is well-developed.  HENT:     Head: Normocephalic.     Nose: Nose normal.  Eyes:     General: No scleral icterus.    Conjunctiva/sclera: Conjunctivae normal.  Neck:     Thyroid: No thyromegaly.  Cardiovascular:     Rate and Rhythm: Regular  rhythm. Bradycardia present.     Heart sounds: No murmur heard.    No friction rub. No gallop.  Pulmonary:     Breath sounds: No stridor. No wheezing or rales.  Chest:     Chest wall: No tenderness.  Abdominal:     General: There is no distension.     Tenderness: There is no abdominal tenderness. There is no rebound.  Musculoskeletal:        General: Normal range of motion.     Cervical back: Neck  supple.  Lymphadenopathy:     Cervical: No cervical adenopathy.  Skin:    Findings: No erythema or rash.  Neurological:     Mental Status: She is alert and oriented to person, place, and time.     Motor: No abnormal muscle tone.     Coordination: Coordination normal.  Psychiatric:        Behavior: Behavior normal.     ED Results / Procedures / Treatments   Labs (all labs ordered are listed, but only abnormal results are displayed) Labs Reviewed  BASIC METABOLIC PANEL - Abnormal; Notable for the following components:      Result Value   Sodium 123 (*)    Chloride 91 (*)    Glucose, Bld 108 (*)    All other components within normal limits  COMPREHENSIVE METABOLIC PANEL - Abnormal; Notable for the following components:   Sodium 129 (*)    Calcium 8.5 (*)    Total Protein 6.0 (*)    All other components within normal limits  CBC - Abnormal; Notable for the following components:   Hemoglobin 11.6 (*)    HCT 35.5 (*)    All other components within normal limits  CBC  OSMOLALITY  OSMOLALITY, URINE  SODIUM, URINE, RANDOM  MAGNESIUM  PHOSPHORUS    EKG EKG Interpretation Date/Time:  Thursday November 04 2022 18:58:34 EDT Ventricular Rate:  47 PR Interval:  202 QRS Duration:  156 QT Interval:  512 QTC Calculation: 453 R Axis:   51  Text Interpretation: Sinus bradycardia Left bundle branch block Abnormal ECG When compared with ECG of 25-Oct-2022 14:49, PREVIOUS ECG IS PRESENT Confirmed by Bethann Berkshire [phone]) on 11/04/2022 11:34:36 PM  Radiology CT Head Wo Contrast  Result Date: 11/04/2022 CLINICAL DATA:  Headache and fever. EXAM: CT HEAD WITHOUT CONTRAST TECHNIQUE: Contiguous axial images were obtained from the base of the skull through the vertex without intravenous contrast. RADIATION DOSE REDUCTION: This exam was performed according to the departmental dose-optimization program which includes automated exposure control, adjustment of the mA and/or kV according to patient  size and/or use of iterative reconstruction technique. COMPARISON:  Head CT dated 10/25/2022. FINDINGS: Brain: Mild age-related atrophy and chronic microvascular ischemic changes. There is no acute intracranial hemorrhage. No mass effect or midline shift. No extra-axial fluid collection. Vascular: No hyperdense vessel or unexpected calcification. Skull: Normal. Negative for fracture or focal lesion. Sinuses/Orbits: No acute finding. Other: None IMPRESSION: 1. No acute intracranial pathology. 2. Mild age-related atrophy and chronic microvascular ischemic changes. Electronically Signed   By: Elgie Collard M.D.   On: 11/04/2022 23:27   DG Chest 2 View  Result Date: 11/04/2022 CLINICAL DATA:  Atrial fibrillation EXAM: CHEST - 2 VIEW COMPARISON:  10/25/2022 FINDINGS: Heart and mediastinal contours are within normal limits. No focal opacities or effusions. No acute bony abnormality. Aortic atherosclerosis. IMPRESSION: No active cardiopulmonary disease. Electronically Signed   By: Charlett Nose M.D.   On: 11/04/2022 19:45  Procedures Procedures    Medications Ordered in ED Medications  0.9 %  sodium chloride infusion ( Intravenous New Bag/Given 11/05/22 1549)  losartan (COZAAR) tablet 25 mg (25 mg Oral Given 11/05/22 1554)  enoxaparin (LOVENOX) injection 40 mg (40 mg Subcutaneous Patient Refused/Not Given 11/05/22 1037)  acetaminophen (TYLENOL) tablet 650 mg (has no administration in time range)    Or  acetaminophen (TYLENOL) suppository 650 mg (has no administration in time range)  ondansetron (ZOFRAN) tablet 4 mg (has no administration in time range)    Or  ondansetron (ZOFRAN) injection 4 mg (has no administration in time range)  amiodarone (PACERONE) tablet 100 mg (100 mg Oral Given 11/05/22 1505)  sodium chloride 0.9 % bolus 500 mL (0 mLs Intravenous Stopped 11/05/22 0105)    ED Course/ Medical Decision Making/ A&P                             Medical Decision Making Amount and/or  Complexity of Data Reviewed Labs: ordered. Radiology: ordered.  Risk Decision regarding hospitalization.  This patient presents to the ED for concern of dizziness and weakness, this involves an extensive number of treatment options, and is a complaint that carries with it a high risk of complications and morbidity.  The differential diagnosis includes anemia, electrolyte abnormality   Co morbidities that complicate the patient evaluation  Hypertension   Additional history obtained:  Additional history obtained from patient External records from outside source obtained and reviewed including hospital records   Lab Tests:  I Ordered, and personally interpreted labs.  The pertinent results include: Sodium 123   Imaging Studies ordered:  I ordered imaging studies including chest x-ray and CT head I independently visualized and interpreted imaging which showed unremarkable I agree with the radiologist interpretation   Cardiac Monitoring: / EKG:  The patient was maintained on a cardiac monitor.  I personally viewed and interpreted the cardiac monitored which showed an underlying rhythm of: Bradycardia   Consultations Obtained:  I requested consultation with the hospitalist,  and discussed lab and imaging findings as well as pertinent plan - they recommend: Admission and treat hyponatremia   Problem List / ED Course / Critical interventions / Medication management  Hypertension and hyponatremia I ordered medication including normal saline for dehydration Reevaluation of the patient after these medicines showed that the patient improved I have reviewed the patients home medicines and have made adjustments as needed   Social Determinants of Health:  None   Test / Admission - Considered:  None  Patient with weakness and hyponatremia.  He will be admitted to medicine        Final Clinical Impression(s) / ED Diagnoses Final diagnoses:  Hyponatremia    Rx /  DC Orders ED Discharge Orders     None         Bethann Berkshire, MD 11/05/22 1643

## 2022-11-05 NOTE — ED Notes (Signed)
Pt called out complaining of thirst. Pt has already had one glass of water. This RN stated to pt I need to check with doctor to see if she has fluid restrictions given her low sodium. Pt states, "if you don't let me have some water, I'll drink from the tap in my room"

## 2022-11-05 NOTE — ED Notes (Signed)
Pt refused seizure pads at this time stating she was too claustrophobic

## 2022-11-05 NOTE — ED Notes (Signed)
Pt refusing cardiac monitoring, and VS.  RN notified.  Pt resting in bed w call bell in hand at this time.

## 2022-11-05 NOTE — ED Notes (Signed)
Pt agreeable to wear tele monitoring at this time, declines pulse ox

## 2022-11-05 NOTE — ED Notes (Signed)
Per Dr. Thomes Dinning, before giving pt more water we need to see AM sodium results. Pt advised of this, states she is "dying of thirst and dehydration". Mouth swabs offered

## 2022-11-05 NOTE — Progress Notes (Signed)
Answers oriented questions correctly but shows signs of confusion saying "are there more patients in the attic?" . Looked in bathroom and said there were a lot of dirty gowns and told me where to look for her blood pressure meds when I go to her house to get them.  Sitting on side of bed and bed alarm set.  Declined sitting in chair.  Gave scheduled losartan.

## 2022-11-05 NOTE — H&P (Signed)
History and Physical    Patient: Heidi Dorsey VHQ:[id number] DOB: Jul 03, 1930 DOA: 11/04/2022 DOS: the patient was seen and examined on 11/05/2022 PCP: Sonny Masters, FNP  Patient coming from: Home  Chief Complaint:  Chief Complaint  Patient presents with   Near Syncope   HPI: Heidi Dorsey is a 89 y.o. female with medical history significant of hypertension, paroxysmal atrial fibrillation, asthma, LBBB who presents to the emergency department via EMS from home due to dizziness and weakness.  She complained of about 1 year of gait instability, however, she was able to with the gait by using a cane.  She now complained of generalized weakness and dizziness has been ongoing for few weeks, but, which worsened within the last few days to the extent that she recently sustained a fall without injury, but, she continued to see herself as being at risk of fall.  Since she lives alone, she decided to activate the EMS and she was taken to the ED for further evaluation and management. Patient was admitted from 7/7 to 7/13 due to atrial flutter with RVR during which she was treated with amiodarone twice daily for 7 days and was then transitioned to once daily dose.  There was plan for patient to receive a Zio monitor at that time.  She also presented with hyponatremia which was corrected with IV hydration.  ED Course:  In the emergency department,, pulse 52 bpm, other vital signs were within normal range.  Workup in the ED showed normal CBC and BMP except for sodium of 123, chloride 91, blood glucose 108. CT head without contrast showed no acute intracranial pathology Chest x-ray showed no active cardiopulmonary disease IV NS 500 mL x 1 was given.  Hospitalist was asked to admit patient for further evaluation and management.  Review of Systems: Review of systems as noted in the HPI. All other systems reviewed and are negative.   Past Medical History:  Diagnosis Date   Asthma    BPPV (benign  paroxysmal positional vertigo)    Claustrophobia    Diverticulosis    GERD (gastroesophageal reflux disease)    H/O degenerative disc disease    Hiatal hernia    Hyperlipidemia    Hypertension    Kidney stones    Migraines    Osteoarthritis    Caused feet deformity   Osteopenia    Prediabetes    Preglaucoma    Raynauds syndrome    Rectocele    Rosacea    Sciatica    Sleep apnea    Spinal stenosis    Vertebrobasilar artery syndrome    Past Surgical History:  Procedure Laterality Date   ABDOMINAL HYSTERECTOMY  1978   ANTERIOR (CYSTOCELE) AND POSTERIOR REPAIR (RECTOCELE) WITH XENFORM GRAFT AND SACROSPINOUS FIXATION     BILATERAL OOPHORECTOMY  1997   bladder stones     kidney colic/ procedure performed in 1973 at [facility]   BLADDER SUSPENSION     BREAST BIOPSY Left 12/1987   neg   BREAST BIOPSY Right 02/1977   neg papilloma   CARDIAC CATHETERIZATION  2000   HERNIA REPAIR     plantar fibroma  Left 09/1997   Excised    Social History:  reports that she has never smoked. She has never used smokeless tobacco. She reports current alcohol use of about 2.0 standard drinks of alcohol per week. She reports that she does not use drugs.   Allergies  Allergen Reactions   Allegra [Fexofenadine] Other (See Comments)  Syncope   Estrace [Estradiol] Other (See Comments)    Headache   Miacalcin [Calcitonin] Palpitations    Left bundle branch block   Nsaids Other (See Comments)    Stomach ulcer after taking for 3 days in 2009. Negative H. Pylori test.   Benadryl [Diphenhydramine] Other (See Comments)    Syncope   Atrovent Hfa [Ipratropium Bromide Hfa] Other (See Comments)    Syncope    Paraphenylenediamine Other (See Comments)    Unknown reaction   Augmentin [Amoxicillin-Pot Clavulanate] Rash    OK to take plain amoxicillin   Biaxin [Clarithromycin] Rash    OK to take azithromycin   Cipro [Ciprofloxacin Hcl] Rash   Codeine Other (See Comments)    Unknown reaction   Epipen  [Epinephrine] Other (See Comments)    Unknown reaction   Erythromycin Other (See Comments)    Stomach irritation   Flonase [Fluticasone] Other (See Comments)    Unknown reaction Similar reactions to corticoid steroids   Latex Rash   Macrobid [Nitrofurantoin] Other (See Comments)    Multiple mild symptoms - would prefer not to take again   Other Other (See Comments)    Propanol amine Anti-cholinergic compounds   Silenor [Doxepin Hcl] Other (See Comments)    Heart arrhythmia   Sulfa Antibiotics Other (See Comments)    Unknown reaction   Sumycin [Tetracycline] Other (See Comments)    Mouth sores OK to take doxycycline   Zestril [Lisinopril] Other (See Comments)    Bruising Cankers Arthralgias    Family History  Problem Relation Age of Onset   Osteoporosis Mother    Ovarian cancer Mother    Uterine cancer Mother    Brain cancer Father    Bladder Cancer Brother    Rectal cancer Brother    Skin cancer Brother    Osteoporosis Sister    Diabetes Paternal Grandmother    Breast cancer Neg Hx      Prior to Admission medications   Medication Sig Start Date End Date Taking? Authorizing Provider  amiodarone (PACERONE) 200 MG tablet Take 1 tablet (200 mg total) by mouth 2 (two) times daily for 7 days. 10/20/22 10/27/22  Sherryll Burger, Pratik D, DO  amiodarone (PACERONE) 200 MG tablet Take 1 tablet (200 mg total) by mouth daily. 10/28/22 11/27/22  Sherryll Burger, Pratik D, DO  ASCORBIC ACID PO Take 1 tablet by mouth daily. OTC vitamin C    [provider]  BIOTIN PO Take 1 tablet by mouth daily.    [provider]  Calcium Carbonate Antacid (TUMS PO) Take 1 tablet by mouth daily as needed (GERD).    [provider]  Cholecalciferol (VITAMIN D-3 PO) Take 1 capsule by mouth daily.    [provider]  losartan (COZAAR) 25 MG tablet Take 1 tablet (25 mg total) by mouth 2 (two) times daily. 10/20/22 11/19/22  Sherryll Burger, Pratik D, DO  Multiple Vitamins-Minerals (CENTRUM SILVER  50+WOMEN) TABS Take 1 tablet by mouth daily.    [provider]  Omega-3 Fatty Acids (FISH OIL PO) Take 1 capsule by mouth daily.    [provider]    Physical Exam: BP (!) 164/54   Pulse (!) 53   Temp 98.2 F (36.8 C) (Oral)   Resp 16   Ht 4\' 10"  (1.473 m)   Wt 56.7 kg   SpO2 98%   BMI 26.13 kg/m   General: 89 y.o. year-old female well developed well nourished in no acute distress.  Alert and oriented x3. HEENT:  NCAT, EOMI Neck: Supple, trachea medial Cardiovascular: Bradycardia with no rubs or gallops.  No thyromegaly or JVD noted.  No lower extremity edema. 2/4 pulses in all 4 extremities. Respiratory: Clear to auscultation with no wheezes or rales. Good inspiratory effort. Abdomen: Soft, nontender nondistended with normal bowel sounds x4 quadrants. Muskuloskeletal: No cyanosis, clubbing or edema noted bilaterally Neuro: CN II-XII intact, strength 5/5 x 4, sensation, reflexes intact Skin: No ulcerative lesions noted or rashes Psychiatry: Judgement and insight appear normal. Mood is appropriate for condition and setting          Labs on Admission:  Basic Metabolic Panel: Recent Labs  Lab 11/04/22 1926  NA 123*  K 4.1  CL 91*  CO2 26  GLUCOSE 108*  BUN 17  CREATININE 0.68  CALCIUM 8.9   Liver Function Tests: No results for input(s): "AST", "ALT", "ALKPHOS", "BILITOT", "PROT", "ALBUMIN" in the last 168 hours. No results for input(s): "LIPASE", "AMYLASE" in the last 168 hours. No results for input(s): "AMMONIA" in the last 168 hours. CBC: Recent Labs  Lab 11/04/22 1926  WBC 5.3  HGB 12.2  HCT 37.3  MCV 90.8  PLT 183   Cardiac Enzymes: No results for input(s): "CKTOTAL", "CKMB", "CKMBINDEX", "TROPONINI" in the last 168 hours.  BNP (last 3 results) Recent Labs    10/17/22 1804  BNP 124.0*    ProBNP (last 3 results) No results for input(s): "PROBNP" in the last 8760 hours.  CBG: No results for input(s): "GLUCAP" in the last 168  hours.  Radiological Exams on Admission: CT Head Wo Contrast  Result Date: 11/04/2022 CLINICAL DATA:  Headache and fever. EXAM: CT HEAD WITHOUT CONTRAST TECHNIQUE: Contiguous axial images were obtained from the base of the skull through the vertex without intravenous contrast. RADIATION DOSE REDUCTION: This exam was performed according to the departmental dose-optimization program which includes automated exposure control, adjustment of the mA and/or kV according to patient size and/or use of iterative reconstruction technique. COMPARISON:  Head CT dated 10/25/2022. FINDINGS: Brain: Mild age-related atrophy and chronic microvascular ischemic changes. There is no acute intracranial hemorrhage. No mass effect or midline shift. No extra-axial fluid collection. Vascular: No hyperdense vessel or unexpected calcification. Skull: Normal. Negative for fracture or focal lesion. Sinuses/Orbits: No acute finding. Other: None IMPRESSION: 1. No acute intracranial pathology. 2. Mild age-related atrophy and chronic microvascular ischemic changes. Electronically Signed   By: Elgie Collard M.D.   On: 11/04/2022 23:27   DG Chest 2 View  Result Date: 11/04/2022 CLINICAL DATA:  Atrial fibrillation EXAM: CHEST - 2 VIEW COMPARISON:  10/25/2022 FINDINGS: Heart and mediastinal contours are within normal limits. No focal opacities or effusions. No acute bony abnormality. Aortic atherosclerosis. IMPRESSION: No active cardiopulmonary disease. Electronically Signed   By: Charlett Nose M.D.   On: 11/04/2022 19:45    EKG: I independently viewed the EKG done and my findings are as followed: Sinus bradycardia at a rate of 47 bpm with LBBB.  Assessment/Plan Present on Admission:  Hyponatremia  Atrial flutter with rapid ventricular response (HCC)  Essential hypertension  Principal Problem:   Hyponatremia Active Problems:   Essential hypertension   Atrial flutter with rapid ventricular response (HCC)  Hyponatremia Na  123 Patient had similar presentation during last admission Continue IV hydration Serum osmolarity, urine osmolarity and urine sodium will be checked  Atrial flutter with RVR EKG personally reviewed shows sinus bradycardia at rate of 47 bpm Continue amiodarone Consider amiodarone dose adjustments considering patient's history of abnormal gait/ataxia,  as well as bradycardia. Cardiology will be consulted and we shall await further recommendations  Essential hypertension Continue losartan   DVT prophylaxis: Lovenox  Advance Care Planning: DNR (this was confirmed with patient)  Consults: Cardiology  Family Communication: None at bedside  Severity of Illness: The appropriate patient status for this patient is OBSERVATION. Observation status is judged to be reasonable and necessary in order to provide the required intensity of service to ensure the patient's safety. The patient's presenting symptoms, physical exam findings, and initial radiographic and laboratory data in the context of their medical condition is felt to place them at decreased risk for further clinical deterioration. Furthermore, it is anticipated that the patient will be medically stable for discharge from the hospital within 2 midnights of admission.   Author: Frankey Shown, DO 11/05/2022 3:45 AM  For on call review [email or web address].

## 2022-11-05 NOTE — Progress Notes (Signed)
   11/05/22 1048  TOC Brief Assessment  Insurance and Status Reviewed  Patient has primary care physician Yes  Home environment has been reviewed Home alone  Prior level of function: ambulates with cane  Prior/Current Home Services No current home services  Social Determinants of Health Reivew SDOH reviewed no interventions necessary  Readmission risk has been reviewed Yes  Transition of care needs no transition of care needs at this time    Transition of Care Department [facility]) has reviewed patient and no TOC needs have been identified at this time. We will continue to monitor patient advancement through interdisciplinary progression rounds. If new patient transition needs arise, please place a TOC consult.

## 2022-11-05 NOTE — Consult Note (Addendum)
Cardiology Consultation   Patient ID: Heidi Dorsey MRN: [id number]; DOB: Sep 09, 1930  Admit date: 11/04/2022 Date of Consult: 11/05/2022  PCP:  Sonny Masters, FNP   [facility] Providers Cardiologist:  Rollene Rotunda, MD        Patient Profile:   Heidi Dorsey is a 89 y.o. female with a hx of paroxysmal atrial fibrillation/flutter complicated by tachy-brady syndrome (not on anticoagulation due to fall-risk and previously declined Watchman Device), HTN, HLD, GERD, LBBB and asthma who is being seen 11/05/2022 for the evaluation of bradycardia at the request of Dr. Idelle Leech.  History of Present Illness:   Ms. Babayev was recently admitted to [facility] from 7/7 - 10/20/2022 for evaluation of palpitations and dizziness. She was found to be in atrial flutter with RVR and was initially on Cardizem but developed bradycardia when she converted to normal sinus rhythm with heart rate in the 40's. She was switched to Amiodarone 200 mg twice daily and was maintaining normal sinus rhythm at the time of discharge. She was not on anticoagulation due to her fall risk and had also declined Watchman device placement.  The possibility of a PPM was also reviewed with the patient and she declined this during admission as well. She did have episodes of AMS during admission which was felt to be secondary to acute delirium but possibly having underlying dementia as well but had not undergone formal workup as an outpatient. She was discharged on Amiodarone 200 mg twice daily for 7 days followed by 200 mg daily and plans for an outpatient heart monitor. She did wear this and by review of the [employer], it is in transit back to [employer].   She presented back to the ED on 10/25/2022 for evaluation of dizziness and "foggy vision". EKG showed frequent PVC's but she was maintaining normal sinus rhythm. An MRI was recommended but she declined.  Presented back to the ED again on 11/04/2022 for similar complaints of  dizziness and "foggy vision". Initial labs showed WBC 5.3, Hgb 12.2, platelets 183, Na+ 123 (previously 131 on 10/25/2022), K+ 4.1 and creatinine 0.68. CXR with no active cardiopulmonary disease. CT Head showed no acute intracranial abnormalities. Was noted to have mild age-related atrophy and chronic microvascular ischemic changes. EKG showed sinus bradycardia,HR 47 with LBBB.   By review of notes, she has been confused overnight and has refused EKG monitoring, routine vital signs and medications. 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 Denies any syncope or presyncope. No recent chest pain, palpitations, orthopnea, PND or pitting edema. 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 We reviewed that in the past she mentioned she did not want a pacemaker and she confirms this today.  Past Medical History:  Diagnosis Date   Asthma    BPPV (benign paroxysmal positional vertigo)    Claustrophobia    Diverticulosis    GERD (gastroesophageal reflux disease)    H/O degenerative disc disease    Hiatal hernia    Hyperlipidemia    Hypertension    Kidney stones    Migraines    Osteoarthritis    Caused feet deformity   Osteopenia    Prediabetes    Preglaucoma    Raynauds syndrome    Rectocele  Rosacea    Sciatica    Sleep apnea    Spinal stenosis    Vertebrobasilar artery syndrome     Past Surgical History:  Procedure Laterality Date   ABDOMINAL HYSTERECTOMY  1978   ANTERIOR (CYSTOCELE) AND POSTERIOR REPAIR (RECTOCELE) WITH XENFORM GRAFT AND SACROSPINOUS FIXATION     BILATERAL OOPHORECTOMY  1997   bladder stones     kidney colic/ procedure performed in 1973 at [facility]   BLADDER SUSPENSION     BREAST BIOPSY Left 12/1987   neg   BREAST BIOPSY Right 02/1977   neg papilloma   CARDIAC  CATHETERIZATION  2000   HERNIA REPAIR     plantar fibroma  Left 09/1997   Excised     Home Medications:  Prior to Admission medications   Medication Sig Start Date End Date Taking? Authorizing Provider  amiodarone (PACERONE) 200 MG tablet Take 1 tablet (200 mg total) by mouth daily. 10/28/22 11/27/22 Yes Shah, Pratik D, DO  ASCORBIC ACID PO Take 1 tablet by mouth daily. OTC vitamin C   Yes [provider]  BIOTIN PO Take 1 tablet by mouth daily.   Yes [provider]  Cholecalciferol (VITAMIN D-3 PO) Take 1 capsule by mouth daily.   Yes [provider]  losartan (COZAAR) 25 MG tablet Take 1 tablet (25 mg total) by mouth 2 (two) times daily. 10/20/22 11/19/22 Yes Shah, Pratik D, DO  Multiple Vitamins-Minerals (CENTRUM SILVER 50+WOMEN) TABS Take 1 tablet by mouth daily.   Yes [provider]  Omega-3 Fatty Acids (FISH OIL PO) Take 1 capsule by mouth daily.   Yes [provider]  amiodarone (PACERONE) 200 MG tablet Take 1 tablet (200 mg total) by mouth 2 (two) times daily for 7 days. Patient not taking: Reported on 11/05/2022 10/20/22 10/27/22  Maurilio Lovely D, DO    Inpatient Medications: Scheduled Meds:  enoxaparin (LOVENOX) injection  40 mg Subcutaneous Q24H   Continuous Infusions:  sodium chloride Stopped (11/05/22 1211)   PRN Meds: acetaminophen **OR** acetaminophen, ondansetron **OR** ondansetron (ZOFRAN) IV  Allergies:    Allergies  Allergen Reactions   Allegra [Fexofenadine] Other (See Comments)    Syncope   Estrace [Estradiol] Other (See Comments)    Headache   Miacalcin [Calcitonin] Palpitations    Left bundle Sennie Borden block   Nsaids Other (See Comments)    Stomach ulcer after taking for 3 days in 2009. Negative H. Pylori test.   Benadryl [Diphenhydramine] Other (See Comments)    Syncope   Atrovent Hfa [Ipratropium Bromide Hfa] Other (See Comments)    Syncope    Paraphenylenediamine Other (See Comments)    Unknown reaction    Augmentin [Amoxicillin-Pot Clavulanate] Rash    OK to take plain amoxicillin   Biaxin [Clarithromycin] Rash    OK to take azithromycin   Cipro [Ciprofloxacin Hcl] Rash   Codeine Other (See Comments)    Unknown reaction   Epipen [Epinephrine] Other (See Comments)    Unknown reaction   Erythromycin Other (See Comments)    Stomach irritation   Flonase [Fluticasone] Other (See Comments)    Unknown reaction Similar reactions to corticoid steroids   Latex Rash   Macrobid [Nitrofurantoin] Other (See Comments)    Multiple mild symptoms - would prefer not to take again   Other Other (See Comments)    Propanol amine Anti-cholinergic compounds   Silenor [Doxepin Hcl] Other (See Comments)    Heart arrhythmia   Sulfa Antibiotics Other (See Comments)  Unknown reaction   Sumycin [Tetracycline] Other (See Comments)    Mouth sores OK to take doxycycline   Zestril [Lisinopril] Other (See Comments)    Bruising Cankers Arthralgias    Social History:   Social History   Socioeconomic History   Marital status: Single    Spouse name: Not on file   Number of children: Not on file   Years of education: Not on file   Highest education level: Not on file  Occupational History   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    Drinks [employer] Coffee and Tea   Originally from [place], then lived in [place] most of her life - moved here 09/2019 - feels out of  place, having trouble finding others with similar interests   She is agnostic - not religious (although raised Catholic) - strong scientific beliefs    Social Determinants of Health   Financial Resource Strain: Low Risk  (06/07/2022)   Overall Financial Resource Strain (CARDIA)    Difficulty of Paying Living Expenses: Not hard at all  Food Insecurity: No Food Insecurity (10/22/2022)   Hunger Vital Sign    Worried About Running Out of Food in the Last Year: Never true    Ran Out of Food in the Last Year: Never true  Transportation Needs: No Transportation Needs (10/22/2022)   PRAPARE - Administrator, Civil Service (Medical): No    Lack of Transportation (Non-Medical): No  Physical Activity: Insufficiently Active (06/07/2022)   Exercise Vital Sign    Days of Exercise per Week: 3 days    Minutes of Exercise per Session: 30 min  Stress: No Stress Concern Present (06/07/2022)   [employer] of Occupational Health - Occupational Stress Questionnaire    Feeling of Stress : Not at all  Social Connections: Socially Isolated (06/07/2022)   Social Connection and Isolation Panel [NHANES]    Frequency of Communication with Friends and Family: More than three times a week    Frequency of Social Gatherings with Friends and Family: More than three times a week    Attends Religious Services: Never    Database administrator or Organizations: No    Attends Banker Meetings: Never    Marital Status: Widowed  Intimate Partner Violence: Not At Risk (10/17/2022)   Humiliation, Afraid, Rape, and Kick questionnaire    Fear of Current or Ex-Partner: No    Emotionally Abused: No    Physically Abused: No    Sexually Abused: No    Family History:    Family History  Problem Relation Age of Onset   Osteoporosis Mother    Ovarian cancer Mother    Uterine cancer Mother    Brain cancer Father    Bladder Cancer Brother    Rectal cancer Brother    Skin cancer Brother  Osteoporosis Sister    Diabetes Paternal Grandmother    Breast cancer Neg Hx      ROS:  Please see the history of present illness.   All other ROS reviewed and negative.     Physical Exam/Data:   Vitals:   11/05/22 0545 11/05/22 0549 11/05/22 0600 11/05/22 1015  BP: (!) 176/56  (!) 157/77 121/71  Pulse:    60  Resp: 17  17 18   Temp:  98 F (36.7 C)  98.2 F (36.8 C)  TempSrc:  Oral  Oral  SpO2: 98%  98% 98%  Weight:      Height:        Intake/Output Summary (Last 24 hours) at 11/05/2022 1303 Last data filed at 11/05/2022 0748 Gross per 24 hour  Intake 855 ml  Output --  Net 855 ml      11/04/2022    6:44 PM 10/19/2022    4:59 AM 10/18/2022    5:17 AM  Last 3 Weights  Weight (lbs) 125 lb 126 lb 5.2 oz 129 lb 13.6 oz  Weight (kg) 56.7 kg 57.3 kg 58.9 kg     Body mass index is 26.13 kg/m.  General: Elderly female appearing in no acute distress. Pacing around the room.  HEENT: normal Neck: no JVD Vascular: No carotid bruits; Distal pulses 2+ bilaterally Cardiac:  normal S1, S2; RRR; no murmur  Lungs:  clear to auscultation bilaterally, no wheezing, rhonchi or rales  Abd: soft, nontender, no hepatomegaly  Ext: no pitting edema Musculoskeletal:  No deformities, BUE and BLE strength normal and equal Skin: warm and dry  Neuro:  CNs 2-12 intact, no focal abnormalities noted Psych:  Normal affect   EKG:  The EKG was personally reviewed and demonstrates: Sinus bradycardia, HR 47 with LBBB.  Telemetry:  Telemetry was personally reviewed and demonstrates: Patient has removed telemetry.   Relevant CV Studies:  Echocardiogram: 10/2022 IMPRESSIONS     1. Left ventricular ejection fraction, by estimation, is 60 to 65%. The  left ventricle has normal function. The left ventricle has no regional  wall motion abnormalities. There is mild concentric left ventricular  hypertrophy. Left ventricular diastolic  parameters are consistent with Grade I diastolic dysfunction  (impaired  relaxation).   2. Right ventricular systolic function is normal. The right ventricular  size is normal. There is normal pulmonary artery systolic pressure. The  estimated right ventricular systolic pressure is 29.5 mmHg.   3. Left atrial size was upper normal.   4. The mitral valve is grossly normal. Trivial mitral valve  regurgitation.   5. The aortic valve is tricuspid. Aortic valve regurgitation is mild.  Aortic valve sclerosis is present, with no evidence of aortic valve  stenosis. Aortic regurgitation PHT measures 1003 msec.   6. The inferior vena cava is normal in size with <50% respiratory  variability, suggesting right atrial pressure of 8 mmHg.   Comparison(s): No prior Echocardiogram.   Laboratory Data:  High Sensitivity Troponin:   Recent Labs  Lab 10/17/22 1803 10/25/22 1537  TROPONINIHS 8 6     Chemistry Recent Labs  Lab 11/04/22 1926 11/05/22 0511  NA 123* 129*  K 4.1 4.1  CL 91* 98  CO2 26 22  GLUCOSE 108* 92  BUN 17 14  CREATININE 0.68 0.76  CALCIUM 8.9 8.5*  MG  --  2.1  GFRNONAA >60 >60  ANIONGAP 6 9    Recent Labs  Lab 11/05/22 0511  PROT 6.0*  ALBUMIN 3.7  AST 17  ALT 15  ALKPHOS 51  BILITOT 0.8   Lipids No results for input(s): "CHOL", "TRIG", "HDL", "LABVLDL", "LDLCALC", "CHOLHDL" in the last 168 hours.  Hematology Recent Labs  Lab 11/04/22 1926 11/05/22 0511  WBC 5.3 6.1  RBC 4.11 3.96  HGB 12.2 11.6*  HCT 37.3 35.5*  MCV 90.8 89.6  MCH 29.7 29.3  MCHC 32.7 32.7  RDW 14.8 14.7  PLT 183 170   Thyroid No results for input(s): "TSH", "FREET4" in the last 168 hours.  BNPNo results for input(s): "BNP", "PROBNP" in the last 168 hours.  DDimer No results for input(s): "DDIMER" in the last 168 hours.   Radiology/Studies:  CT Head Wo Contrast  Result Date: 11/04/2022 CLINICAL DATA:  Headache and fever. EXAM: CT HEAD WITHOUT CONTRAST TECHNIQUE: Contiguous axial images were obtained from the base of the skull through  the vertex without intravenous contrast. RADIATION DOSE REDUCTION: This exam was performed according to the departmental dose-optimization program which includes automated exposure control, adjustment of the mA and/or kV according to patient size and/or use of iterative reconstruction technique. COMPARISON:  Head CT dated 10/25/2022. FINDINGS: Brain: Mild age-related atrophy and chronic microvascular ischemic changes. There is no acute intracranial hemorrhage. No mass effect or midline shift. No extra-axial fluid collection. Vascular: No hyperdense vessel or unexpected calcification. Skull: Normal. Negative for fracture or focal lesion. Sinuses/Orbits: No acute finding. Other: None IMPRESSION: 1. No acute intracranial pathology. 2. Mild age-related atrophy and chronic microvascular ischemic changes. Electronically Signed   By: Elgie Collard M.D.   On: 11/04/2022 23:27   DG Chest 2 View  Result Date: 11/04/2022 CLINICAL DATA:  Atrial fibrillation EXAM: CHEST - 2 VIEW COMPARISON:  10/25/2022 FINDINGS: Heart and mediastinal contours are within normal limits. No focal opacities or effusions. No acute bony abnormality. Aortic atherosclerosis. IMPRESSION: No active cardiopulmonary disease. Electronically Signed   By: Charlett Nose M.D.   On: 11/04/2022 19:45     Assessment and Plan:   1. Paroxysmal Atrial Fibrillation/Flutter Complicated by Tachy-brady Syndrome - She was recently admitted with new onset atrial flutter with RVR and developed significant bradycardia with AV nodal blocking agents, therefore she was transitioned to Amiodarone and converted back to normal sinus rhythm. She is currently taking Amiodarone 200 mg daily (was on 200 twice daily for 7 days and reduced this). She did wear a Zio patch but results are not yet available.  - Given that her heart rate was in the 40's to 50's earlier today, will continue to avoid AV nodal blocking agents. We can perhaps reduce Amiodarone to 100 mg daily but  options are limited as she continues to decline PPM placement. - While her CHA2DS2-VASc Score is 4, she previously refused anticoagulation due to frequent falls and declined Watchman device placement. As discussed above, Amiodarone is not ideal given that she is not on anticoagulation but options are very limited.  2. LBBB - Noted on prior tracings. Echocardiogram last month showed a preserved EF of 60 to 65% with no regional wall motion abnormalities. She denies any recent anginal symptoms. No plans for further ischemic testing this admission.  3. HTN - BP was at 121/71 on most recent check. She has been continued on PTA Losartan 25 mg twice daily.  4. Hyponatremia - Na+ at 123 on admission, improved to 129 today after receiving IV fluids.   5. Memory Issues/Acute Delirium - She is A&Ox3 currently but has been confused overnight and this morning and previously removed all  of her telemetry and declined vitals checks and medications.  Reviewed with the patient, and she is open to wearing telemetry at this time. Nursing secretary made aware so this can be reapplied.   For questions or updates, please contact [facility] Please consult [email or web address] for contact info under    Signed, Ellsworth Lennox, PA-C  11/05/2022 1:03 PM    Attending note   Patient seen and discussed with PA Iran Ouch, I agree with her documentation. 89 yo female history of PAF/aflutter not on anticoag due to fall risk and has turned down watchman device, tachy-brady syndrome has refused pacemaker, chronic LBBB, asthma, recurring hyponatremia presents with dizziness      Admit earlier this month with tachy brady aflutter with RVR and sinus brady to 40s. She turned down pacemaker at that time, was managed with amiodarone     WBC 5.3 Hgb 12.2 Plt 183 Na 123 Cr 0.68 BUN 17 Mg 2.1 CXR no acute process CT head no acute pathology EKG sinus brady 47, chronic LBBB 10/2022 echo: LVEF 60-65%, no WMAs, grade I  dd, normal RV    Assessment/plan  1.Tachy brady/aflutter/afib - recent admission as outlined above with tachy brady, aflutter with RVR and sinus brady - started on amiodarone durint that admission, currently taking 200mg  daily - she has refused pacemaker thus far and continues to do so today - presented with dizziness, tele shows sinus brady to low 40s. Will lower her amio to 100mg  daily. No other real options unless she commits to pacemaker at some point. - not on anticoag due to fall risk, has turned down watchman device.       2. Dizziness - certaintly may be rhythm related -with marked hyponateremia that improved with IVFs last visit could be component of hypovolemia. Check orthostatics though already has received IVFs   Dina Rich MD

## 2022-11-05 NOTE — Care Plan (Signed)
This 89 years old female with PMH significant for hypertension, paroxysmal A-fib, asthma, LBBB presented to the ED with complaints of generalized weakness and dizziness.  Patient reports having unsteady gait for about 1 year however she was able to ambulate with cane.  Patient reported worsening of dizziness in last few days to extent that she recently sustained a fall without any injury but continued to see herself as being at risk for fall.  She lives alone and has called the EMS and was taken to the ED for further evaluation.  Patient was recently admitted from 7/7 to 7/13 due to atrial flutter with RVR,  during which she was treated with amiodarone twice daily for 7 days and then transitioned to once daily dose.  Patient was scheduled to have a Zio patch monitor at that time. She was also found to have hyponatremia and it was corrected with IV hydration at the time.  ED workup revealed CT head without any acute intracranial abnormality.  Chest x-ray with no active pulmonary disease.  Patient is admitted for further evaluation.  Cardiology is consulted.  Serum sodium has improved from 123-129.  Per cardiology patient has been refusing care.

## 2022-11-05 NOTE — ED Notes (Signed)
Pt's daughter given update via phone °

## 2022-11-05 NOTE — Care Management Obs Status (Signed)
MEDICARE OBSERVATION STATUS NOTIFICATION   Patient Details  Name: Heidi Dorsey MRN: [id number] Date of Birth: 1930/12/13   Medicare Observation Status Notification Given:  Yes    Corey Harold 11/05/2022, 4:05 PM

## 2022-11-06 DIAGNOSIS — Z881 Allergy status to other antibiotic agents status: Secondary | ICD-10-CM | POA: Diagnosis not present

## 2022-11-06 DIAGNOSIS — Z66 Do not resuscitate: Secondary | ICD-10-CM | POA: Diagnosis present

## 2022-11-06 DIAGNOSIS — Z79899 Other long term (current) drug therapy: Secondary | ICD-10-CM | POA: Diagnosis not present

## 2022-11-06 DIAGNOSIS — J45909 Unspecified asthma, uncomplicated: Secondary | ICD-10-CM | POA: Diagnosis present

## 2022-11-06 DIAGNOSIS — R7303 Prediabetes: Secondary | ICD-10-CM | POA: Diagnosis present

## 2022-11-06 DIAGNOSIS — Z885 Allergy status to narcotic agent status: Secondary | ICD-10-CM | POA: Diagnosis not present

## 2022-11-06 DIAGNOSIS — I10 Essential (primary) hypertension: Secondary | ICD-10-CM | POA: Diagnosis present

## 2022-11-06 DIAGNOSIS — I4892 Unspecified atrial flutter: Secondary | ICD-10-CM | POA: Diagnosis present

## 2022-11-06 DIAGNOSIS — E785 Hyperlipidemia, unspecified: Secondary | ICD-10-CM | POA: Diagnosis present

## 2022-11-06 DIAGNOSIS — G9341 Metabolic encephalopathy: Secondary | ICD-10-CM | POA: Diagnosis present

## 2022-11-06 DIAGNOSIS — Z882 Allergy status to sulfonamides status: Secondary | ICD-10-CM | POA: Diagnosis not present

## 2022-11-06 DIAGNOSIS — Z886 Allergy status to analgesic agent status: Secondary | ICD-10-CM | POA: Diagnosis not present

## 2022-11-06 DIAGNOSIS — Z888 Allergy status to other drugs, medicaments and biological substances status: Secondary | ICD-10-CM | POA: Diagnosis not present

## 2022-11-06 DIAGNOSIS — K219 Gastro-esophageal reflux disease without esophagitis: Secondary | ICD-10-CM | POA: Diagnosis present

## 2022-11-06 DIAGNOSIS — R001 Bradycardia, unspecified: Secondary | ICD-10-CM

## 2022-11-06 DIAGNOSIS — I495 Sick sinus syndrome: Secondary | ICD-10-CM | POA: Diagnosis present

## 2022-11-06 DIAGNOSIS — E871 Hypo-osmolality and hyponatremia: Secondary | ICD-10-CM | POA: Diagnosis present

## 2022-11-06 DIAGNOSIS — Z9181 History of falling: Secondary | ICD-10-CM | POA: Diagnosis not present

## 2022-11-06 DIAGNOSIS — Z91040 Latex allergy status: Secondary | ICD-10-CM | POA: Diagnosis not present

## 2022-11-06 DIAGNOSIS — G4730 Sleep apnea, unspecified: Secondary | ICD-10-CM | POA: Diagnosis present

## 2022-11-06 DIAGNOSIS — I447 Left bundle-branch block, unspecified: Secondary | ICD-10-CM | POA: Diagnosis present

## 2022-11-06 DIAGNOSIS — I7300 Raynaud's syndrome without gangrene: Secondary | ICD-10-CM | POA: Diagnosis present

## 2022-11-06 DIAGNOSIS — I480 Paroxysmal atrial fibrillation: Secondary | ICD-10-CM | POA: Diagnosis present

## 2022-11-06 DIAGNOSIS — Z880 Allergy status to penicillin: Secondary | ICD-10-CM | POA: Diagnosis not present

## 2022-11-06 DIAGNOSIS — R2681 Unsteadiness on feet: Secondary | ICD-10-CM | POA: Diagnosis present

## 2022-11-06 MED ORDER — HYDRALAZINE HCL 25 MG PO TABS
25.0000 mg | ORAL_TABLET | Freq: Three times a day (TID) | ORAL | Status: DC | PRN
  Administered 2022-11-06: 25 mg via ORAL
  Filled 2022-11-06: qty 1

## 2022-11-06 NOTE — Progress Notes (Signed)
Patient confused, getting out of the bed. Very unsteady. Patient helped to the bathroom twice. Patient feisty, not wanting to answer orientation questions at this time. Patient assisted back to bed. Call light within reach. Bed alarm on.

## 2022-11-06 NOTE — Progress Notes (Signed)
   11/06/22 1644  Vitals  BP (!) 170/65  Pulse Rate 60  Resp 20  MEWS COLOR  MEWS Score Color Green  Oxygen Therapy  SpO2 100 %  O2 Device Room Air  MEWS Score  MEWS Temp 0  MEWS Systolic 0  MEWS Pulse 0  MEWS RR 0  MEWS LOC 0  MEWS Score 0   MD Ayiku notified.

## 2022-11-06 NOTE — Progress Notes (Addendum)
Progress Note    Heidi Dorsey  VWU:[id number] DOB: 12/31/1930  DOA: 11/04/2022 PCP: Sonny Masters, FNP      Brief Narrative:    Medical records reviewed and are as summarized below:  Heidi Dorsey is a 89 y.o. female with medical history significant of hypertension, paroxysmal atrial fibrillation, asthma, LBBB, who presented to the hospital because of dizziness and general weakness.          Assessment/Plan:   Principal Problem:   Hyponatremia Active Problems:   Essential hypertension   Atrial flutter with rapid ventricular response (HCC)    Body mass index is 25.96 kg/m.    Paroxysmal atrial fibrillation/flutter complicated by tachybradycardia syndrome: Telemetry shows sinus bradycardia with heart rate in the 40s with occasional sinus pauses for about 2 seconds.  She was taking amiodarone 200 mg daily prior to admission but this has been decreased to 100 mg daily per cardiologist recommendation.  Monitor heart rate on telemetry. Appreciate input from cardiologist.  Patient has declined anticoagulation, Watchman device and pacemaker per cardiologist.   Left bundle branch block: No chest pain   Acute on chronic hyponatremia: Sodium has improved from 123-129.  Repeat BMP tomorrow.   Delirium: Improved.  Mental status appears to be at baseline.   Hypertension: Continue losartan   Unsteady gait: PT evaluation   Diet Order             Diet Heart Room service appropriate? Yes; Fluid consistency: Thin  Diet effective now                            Consultants: Cardiologist  Procedures: None    Medications:    amiodarone  100 mg Oral Daily   enoxaparin (LOVENOX) injection  40 mg Subcutaneous Q24H   losartan  25 mg Oral BID   Continuous Infusions:   Anti-infectives (From admission, onward)    None              Family Communication/Anticipated D/C date and plan/Code Status   DVT prophylaxis: enoxaparin  (LOVENOX) injection 40 mg Start: 11/05/22 1000 SCDs Start: 11/05/22 0333     Code Status: DNR  Family Communication: None Disposition Plan: Plan to discharge home in 1 to 2 days   Status is: Observation The patient will require care spanning > 2 midnights and should be moved to inpatient because: Bradycardia       Subjective:   Interval events noted.  No shortness of breath, chest pain, dizziness or general weakness  Objective:    Vitals:   11/05/22 1526 11/05/22 1946 11/05/22 2311 11/06/22 0356  BP: (!) 182/68 (!) 155/61 (!) 133/53 (!) 162/70  Pulse: (!) 47 (!) 56 (!) 56 60  Resp: 16 20 20 20   Temp: 97.7 F (36.5 C) 98 F (36.7 C) 98 F (36.7 C) 97.7 F (36.5 C)  TempSrc: Oral  Oral Oral  SpO2:  99% 99% 99%  Weight: 60.3 kg     Height: 5' (1.524 m)      Orthostatic VS for the past 24 hrs:  BP- Sitting Pulse- Sitting BP- Standing at 0 minutes Pulse- Standing at 0 minutes  11/05/22 1513 165/64 60 174/59 70     Intake/Output Summary (Last 24 hours) at 11/06/2022 0955 Last data filed at 11/06/2022 0900 Gross per 24 hour  Intake 621.48 ml  Output --  Net 621.48 ml   Filed Weights   11/04/22 1844 11/05/22 1526  Weight: 56.7 kg 60.3 kg    Exam:  GEN: NAD SKIN: Warm and dry EYES: EOMI ENT: MMM CV: RRR PULM: CTA B ABD: soft, ND, NT, +BS CNS: AAO x 3, non focal EXT: No edema or tenderness        Data Reviewed:   I have personally reviewed following labs and imaging studies:  Labs: Labs show the following:   Basic Metabolic Panel: Recent Labs  Lab 11/04/22 1926 11/05/22 0511  NA 123* 129*  K 4.1 4.1  CL 91* 98  CO2 26 22  GLUCOSE 108* 92  BUN 17 14  CREATININE 0.68 0.76  CALCIUM 8.9 8.5*  MG  --  2.1  PHOS  --  3.0   GFR Estimated Creatinine Clearance: 38.7 mL/min (by C-G formula based on SCr of 0.76 mg/dL). Liver Function Tests: Recent Labs  Lab 11/05/22 0511  AST 17  ALT 15  ALKPHOS 51  BILITOT 0.8  PROT 6.0*  ALBUMIN  3.7   No results for input(s): "LIPASE", "AMYLASE" in the last 168 hours. No results for input(s): "AMMONIA" in the last 168 hours. Coagulation profile No results for input(s): "INR", "PROTIME" in the last 168 hours.  CBC: Recent Labs  Lab 11/04/22 1926 11/05/22 0511  WBC 5.3 6.1  HGB 12.2 11.6*  HCT 37.3 35.5*  MCV 90.8 89.6  PLT 183 170   Cardiac Enzymes: No results for input(s): "CKTOTAL", "CKMB", "CKMBINDEX", "TROPONINI" in the last 168 hours. BNP (last 3 results) No results for input(s): "PROBNP" in the last 8760 hours. CBG: No results for input(s): "GLUCAP" in the last 168 hours. D-Dimer: No results for input(s): "DDIMER" in the last 72 hours. Hgb A1c: No results for input(s): "HGBA1C" in the last 72 hours. Lipid Profile: No results for input(s): "CHOL", "HDL", "LDLCALC", "TRIG", "CHOLHDL", "LDLDIRECT" in the last 72 hours. Thyroid function studies: No results for input(s): "TSH", "T4TOTAL", "T3FREE", "THYROIDAB" in the last 72 hours.  Invalid input(s): "FREET3" Anemia work up: No results for input(s): "VITAMINB12", "FOLATE", "FERRITIN", "TIBC", "IRON", "RETICCTPCT" in the last 72 hours. Sepsis Labs: Recent Labs  Lab 11/04/22 1926 11/05/22 0511  WBC 5.3 6.1    Microbiology No results found for this or any previous visit (from the past 240 hour(s)).  Procedures and diagnostic studies:  CT Head Wo Contrast  Result Date: 11/04/2022 CLINICAL DATA:  Headache and fever. EXAM: CT HEAD WITHOUT CONTRAST TECHNIQUE: Contiguous axial images were obtained from the base of the skull through the vertex without intravenous contrast. RADIATION DOSE REDUCTION: This exam was performed according to the departmental dose-optimization program which includes automated exposure control, adjustment of the mA and/or kV according to patient size and/or use of iterative reconstruction technique. COMPARISON:  Head CT dated 10/25/2022. FINDINGS: Brain: Mild age-related atrophy and chronic  microvascular ischemic changes. There is no acute intracranial hemorrhage. No mass effect or midline shift. No extra-axial fluid collection. Vascular: No hyperdense vessel or unexpected calcification. Skull: Normal. Negative for fracture or focal lesion. Sinuses/Orbits: No acute finding. Other: None IMPRESSION: 1. No acute intracranial pathology. 2. Mild age-related atrophy and chronic microvascular ischemic changes. Electronically Signed   By: Elgie Collard M.D.   On: 11/04/2022 23:27   DG Chest 2 View  Result Date: 11/04/2022 CLINICAL DATA:  Atrial fibrillation EXAM: CHEST - 2 VIEW COMPARISON:  10/25/2022 FINDINGS: Heart and mediastinal contours are within normal limits. No focal opacities or effusions. No acute bony abnormality. Aortic atherosclerosis. IMPRESSION: No active cardiopulmonary disease. Electronically Signed   By:  Charlett Nose M.D.   On: 11/04/2022 19:45               LOS: 0 days   Delynn Pursley  [facility]   Pager on [email or web address]. If 7PM-7AM, please contact night-coverage at [email or web address]     11/06/2022, 9:55 AM

## 2022-11-06 NOTE — Progress Notes (Signed)
   11/06/22 1756  Vitals  BP (!) 104/37  MAP (mmHg) (!) 57  Pulse Rate (!) 58  MEWS COLOR  MEWS Score Color Green  MEWS Score  MEWS Temp 0  MEWS Systolic 0  MEWS Pulse 0  MEWS RR 0  MEWS LOC 0  MEWS Score 0   BP after prn hydralazine. MD Ayiku notified.

## 2022-11-07 DIAGNOSIS — E871 Hypo-osmolality and hyponatremia: Secondary | ICD-10-CM | POA: Diagnosis not present

## 2022-11-07 LAB — BASIC METABOLIC PANEL
Anion gap: 3 — ABNORMAL LOW (ref 5–15)
BUN: 15 mg/dL (ref 8–23)
CO2: 25 mmol/L (ref 22–32)
Calcium: 8.1 mg/dL — ABNORMAL LOW (ref 8.9–10.3)
Chloride: 100 mmol/L (ref 98–111)
Creatinine, Ser: 0.79 mg/dL (ref 0.44–1.00)
GFR, Estimated: >60 mL/min (ref >60)
Glucose, Bld: 104 mg/dL — ABNORMAL HIGH (ref 70–99)
Potassium: 3.9 mmol/L (ref 3.5–5.1)
Sodium: 128 mmol/L — ABNORMAL LOW (ref 135–145)

## 2022-11-07 MED ORDER — ACETAMINOPHEN 325 MG PO TABS: 650.0000 mg | ORAL_TABLET | Freq: Four times a day (QID) | ORAL | Status: DC | PRN

## 2022-11-07 MED ORDER — LOSARTAN POTASSIUM 50 MG PO TABS: 50.0000 mg | ORAL_TABLET | Freq: Every day | ORAL | 1 refills | Status: DC

## 2022-11-07 MED ORDER — AMIODARONE HCL 100 MG PO TABS: 100.0000 mg | ORAL_TABLET | Freq: Every day | ORAL | 3 refills | Status: DC

## 2022-11-07 MED ORDER — SODIUM CHLORIDE 1 G PO TABS: 1.0000 g | ORAL_TABLET | Freq: Every day | ORAL | 0 refills | Status: AC

## 2022-11-07 NOTE — Plan of Care (Signed)

## 2022-11-07 NOTE — Discharge Instructions (Signed)
1)Liberalize Diet 2)Repeat BMP Blood test in about a week or so 3)follow up with cardiologist in 2 to 3 weeks for recheck

## 2022-11-07 NOTE — Discharge Summary (Signed)
Heidi Dorsey, is a 89 y.o. female  DOB Jan 04, 1931  MRN [id number].  Admission date:  11/04/2022  Admitting Physician  Lurene Shadow, MD  Discharge Date:  11/07/2022   Primary MD  Sonny Masters, FNP  Recommendations for primary care physician for things to follow:  1)Liberalize Diet 2)Repeat BMP Blood test in about a week or so 3)follow up with cardiologist in 2 to 3 weeks for recheck  Admission Diagnosis  Hyponatremia [E87.1] Bradycardia [R00.1]   Discharge Diagnosis  Hyponatremia [E87.1] Bradycardia [R00.1]    Principal Problem:   Hyponatremia Active Problems:   Essential hypertension   Atrial flutter with rapid ventricular response (HCC)   Bradycardia      Past Medical History:  Diagnosis Date   Asthma    BPPV (benign paroxysmal positional vertigo)    Claustrophobia    Diverticulosis    GERD (gastroesophageal reflux disease)    H/O degenerative disc disease    Hiatal hernia    Hyperlipidemia    Hypertension    Kidney stones    Migraines    Osteoarthritis    Caused feet deformity   Osteopenia    Prediabetes    Preglaucoma    Raynauds syndrome    Rectocele    Rosacea    Sciatica    Sleep apnea    Spinal stenosis    Vertebrobasilar artery syndrome     Past Surgical History:  Procedure Laterality Date   ABDOMINAL HYSTERECTOMY  1978   ANTERIOR (CYSTOCELE) AND POSTERIOR REPAIR (RECTOCELE) WITH XENFORM GRAFT AND SACROSPINOUS FIXATION     BILATERAL OOPHORECTOMY  1997   bladder stones     kidney colic/ procedure performed in 1973 at [facility]   BLADDER SUSPENSION     BREAST BIOPSY Left 12/1987   neg   BREAST BIOPSY Right 02/1977   neg papilloma   CARDIAC CATHETERIZATION  2000   HERNIA REPAIR     plantar fibroma  Left 09/1997   Excised     HPI  from the history and physical done on the day of admission:  HPI: Heidi Dorsey is a 89 y.o. female with medical history  significant of hypertension, paroxysmal atrial fibrillation, asthma, LBBB who presents to the emergency department via EMS from home due to dizziness and weakness.  She complained of about 1 year of gait instability, however, she was able to with the gait by using a cane.  She now complained of generalized weakness and dizziness has been ongoing for few weeks, but, which worsened within the last few days to the extent that she recently sustained a fall without injury, but, she continued to see herself as being at risk of fall.  Since she lives alone, she decided to activate the EMS and she was taken to the ED for further evaluation and management. Patient was admitted from 7/7 to 7/13 due to atrial flutter with RVR during which she was treated with amiodarone twice daily for 7 days and was then transitioned to once daily dose.  There was  plan for patient to receive a Zio monitor at that time.  She also presented with hyponatremia which was corrected with IV hydration.   ED Course:  In the emergency department,, pulse 52 bpm, other vital signs were within normal range.  Workup in the ED showed normal CBC and BMP except for sodium of 123, chloride 91, blood glucose 108. CT head without contrast showed no acute intracranial pathology Chest x-ray showed no active cardiopulmonary disease IV NS 500 mL x 1 was given.  Hospitalist was asked to admit patient for further evaluation and management.   Review of Systems: Review of systems as noted in the HPI. All other systems reviewed and are negative.    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:  -On telemetry patient noted to be bradycardic into the 68s  -Cardiology consult appreciated -Patient declines pacemaker placement -Avoid AV nodal blocking agents -Decrease amiodarone to 100 mg daily from 200 mg -Patient declines anticoagulation -Patient declines evaluation for Watchman  device -Patient follow-up with cardiology advised Patient has left bundle branch block:   2)Acute on chronic hyponatremia: Due to excessive free water intake -Sodium was 123 on admission -Sodium is up to 128 -Serum chloride tablets for 1 week only -Repeat BMP within a week advised  3) acute metabolic encephalopathy --- due to #2 above  -Patient is back to baseline   4) history hypertension: stable, change Losartan to 50 daily from 25 mg twice daily   5)Unsteady gait: Due to deformities of patient's feet -Prior to admission patient was getting PT OT treatment at home -Okay to resume PT OT postdischarge  Discharge Condition: stable  Follow UP--cardiology   Consults obtained -cardiology  Diet and Activity recommendation:  As advised  Discharge Instructions   Discharge Instructions     Call MD for:  difficulty breathing, headache or visual disturbances   Complete by: As directed    Call MD for:  persistant dizziness or light-headedness   Complete by: As directed    Call MD for:  persistant nausea and vomiting   Complete by: As directed    Call MD for:  temperature >100.4   Complete by: As directed    Diet general   Complete by: As directed    Liberalize Diet   -Okay to Liberalize your diet   Discharge instructions   Complete by: As directed    1)Liberalize Diet 2)Repeat BMP Blood test in about a week or so 3)follow up with cardiologist in 2 to 3 weeks for recheck   Increase activity slowly   Complete by: As directed        Discharge Medications     Allergies as of 11/07/2022       Reactions   Allegra [fexofenadine] Other (See Comments)   Syncope   Estrace [estradiol] Other (See Comments)   Headache   Miacalcin [calcitonin] Palpitations   Left bundle branch block   Nsaids Other (See Comments)   Stomach ulcer after taking for 3 days in 2009. Negative H. Pylori test.   Benadryl [diphenhydramine] Other (See Comments)   Syncope   Atrovent Hfa [ipratropium  Bromide Hfa] Other (See Comments)   Syncope    Paraphenylenediamine Other (See Comments)   Unknown reaction   Augmentin [amoxicillin-pot Clavulanate] Rash   OK to take plain amoxicillin   Biaxin [clarithromycin] Rash   OK to take azithromycin   Cipro [ciprofloxacin Hcl] Rash   Codeine Other (See Comments)   Unknown reaction  Epipen [epinephrine] Other (See Comments)   Unknown reaction   Erythromycin Other (See Comments)   Stomach irritation   Flonase [fluticasone] Other (See Comments)   Unknown reaction Similar reactions to corticoid steroids   Latex Rash   Macrobid [nitrofurantoin] Other (See Comments)   Multiple mild symptoms - would prefer not to take again   Other Other (See Comments)   Propanol amine Anti-cholinergic compounds   Silenor [doxepin Hcl] Other (See Comments)   Heart arrhythmia   Sulfa Antibiotics Other (See Comments)   Unknown reaction   Sumycin [tetracycline] Other (See Comments)   Mouth sores OK to take doxycycline   Zestril [lisinopril] Other (See Comments)   Bruising Cankers Arthralgias        Medication List     TAKE these medications    acetaminophen 325 MG tablet Commonly known as: TYLENOL Take 2 tablets (650 mg total) by mouth every 6 (six) hours as needed for mild pain (or Fever >/= 101).   amiodarone 100 MG tablet Commonly known as: PACERONE Take 1 tablet (100 mg total) by mouth daily. Start taking on: November 08, 2022 What changed:  medication strength how much to take when to take this Another medication with the same name was removed. Continue taking this medication, and follow the directions you see here.   ASCORBIC ACID PO Take 1 tablet by mouth daily. OTC vitamin C   BIOTIN PO Take 1 tablet by mouth daily.   Centrum Silver 50+Women Tabs Take 1 tablet by mouth daily.   FISH OIL PO Take 1 capsule by mouth daily.   losartan 50 MG tablet Commonly known as: COZAAR Take 1 tablet (50 mg total) by mouth daily. What  changed:  medication strength how much to take when to take this   sodium chloride 1 g tablet Take 1 tablet (1 g total) by mouth daily for 7 days.   VITAMIN D-3 PO Take 1 capsule by mouth daily.       Major procedures and Radiology Reports - PLEASE review detailed and final reports for all details, in brief -   CT Head Wo Contrast  Result Date: 11/04/2022 CLINICAL DATA:  Headache and fever. EXAM: CT HEAD WITHOUT CONTRAST TECHNIQUE: Contiguous axial images were obtained from the base of the skull through the vertex without intravenous contrast. RADIATION DOSE REDUCTION: This exam was performed according to the departmental dose-optimization program which includes automated exposure control, adjustment of the mA and/or kV according to patient size and/or use of iterative reconstruction technique. COMPARISON:  Head CT dated 10/25/2022. FINDINGS: Brain: Mild age-related atrophy and chronic microvascular ischemic changes. There is no acute intracranial hemorrhage. No mass effect or midline shift. No extra-axial fluid collection. Vascular: No hyperdense vessel or unexpected calcification. Skull: Normal. Negative for fracture or focal lesion. Sinuses/Orbits: No acute finding. Other: None IMPRESSION: 1. No acute intracranial pathology. 2. Mild age-related atrophy and chronic microvascular ischemic changes. Electronically Signed   By: Elgie Collard M.D.   On: 11/04/2022 23:27   DG Chest 2 View  Result Date: 11/04/2022 CLINICAL DATA:  Atrial fibrillation EXAM: CHEST - 2 VIEW COMPARISON:  10/25/2022 FINDINGS: Heart and mediastinal contours are within normal limits. No focal opacities or effusions. No acute bony abnormality. Aortic atherosclerosis. IMPRESSION: No active cardiopulmonary disease. Electronically Signed   By: Charlett Nose M.D.   On: 11/04/2022 19:45   CT Head Wo Contrast  Result Date: 10/25/2022 CLINICAL DATA:  Dizziness EXAM: CT HEAD WITHOUT CONTRAST TECHNIQUE: Contiguous axial images  were obtained from the base of the skull through the vertex without intravenous contrast. RADIATION DOSE REDUCTION: This exam was performed according to the departmental dose-optimization program which includes automated exposure control, adjustment of the mA and/or kV according to patient size and/or use of iterative reconstruction technique. COMPARISON:  07/07/2022 FINDINGS: Brain: No acute intracranial findings are seen. There are no signs of bleeding within the cranium. Cortical sulci are prominent. There is no focal edema or mass effect. Vascular: Scattered arterial calcifications are seen. Skull: No fracture is seen in calvarium. Hyperostosis frontalis interna is seen. Sinuses/Orbits: Unremarkable. Other: None. IMPRESSION: No acute intracranial findings are seen in noncontrast CT brain. Atrophy. Electronically Signed   By: Ernie Avena M.D.   On: 10/25/2022 16:41   DG Chest Portable 1 View  Result Date: 10/25/2022 CLINICAL DATA:  Dyspnea, dizziness EXAM: PORTABLE CHEST 1 VIEW COMPARISON:  10/17/2022 FINDINGS: Transverse diameter of heart is increased. There are no signs of pulmonary edema or focal pulmonary consolidation. There is no pleural effusion or pneumothorax. There is an electronic device in left infraclavicular region. Degenerative changes are noted in both shoulders, more so on the left side. IMPRESSION: There are no signs of pulmonary edema or new focal infiltrates. Electronically Signed   By: Ernie Avena M.D.   On: 10/25/2022 15:41   ECHOCARDIOGRAM COMPLETE  Result Date: 10/18/2022    ECHOCARDIOGRAM REPORT   Patient Name:   VALTA AYDT Date of Exam: 10/18/2022 Medical Rec #:  [id number]     Height:       58.0 in Accession #:    [id number]    Weight:       129.9 lb Date of Birth:  [birth year]    BSA:          1.516 m Patient Age:    89 years      BP:           156/48 mmHg Patient Gender: F             HR:           56 bpm. [facility] Procedure: 2D Echo, Cardiac  Doppler and Color Doppler Indications:    Raynauds syndrome [I73.00]  History:        Patient has no prior history of Echocardiogram examinations.                 Arrythmias:LBBB, Atrial Fibrillation and Atrial Flutter; Risk                 Factors:Hypertension, Non-Smoker and Sleep Apnea. Sciatica,                 Raynauds syndrome.  Sonographer:    Aron Baba Referring Phys: [id number] Heloise Beecham Saliou Barnier IMPRESSIONS  1. Left ventricular ejection fraction, by estimation, is 60 to 65%. The left ventricle has normal function. The left ventricle has no regional wall motion abnormalities. There is mild concentric left ventricular hypertrophy. Left ventricular diastolic parameters are consistent with Grade I diastolic dysfunction (impaired relaxation).  2. Right ventricular systolic function is normal. The right ventricular size is normal. There is normal pulmonary artery systolic pressure. The estimated right ventricular systolic pressure is 29.5 mmHg.  3. Left atrial size was upper normal.  4. The mitral valve is grossly normal. Trivial mitral valve regurgitation.  5. The aortic valve is tricuspid. Aortic valve regurgitation is mild. Aortic valve sclerosis is present, with no evidence of aortic valve stenosis. Aortic regurgitation PHT measures 1003  msec.  6. The inferior vena cava is normal in size with <50% respiratory variability, suggesting right atrial pressure of 8 mmHg. Comparison(s): No prior Echocardiogram. FINDINGS  Left Ventricle: Left ventricular ejection fraction, by estimation, is 60 to 65%. The left ventricle has normal function. The left ventricle has no regional wall motion abnormalities. The left ventricular internal cavity size was normal in size. There is  mild concentric left ventricular hypertrophy. Abnormal (paradoxical) septal motion, consistent with left bundle branch block. Left ventricular diastolic parameters are consistent with Grade I diastolic dysfunction (impaired relaxation). Right  Ventricle: The right ventricular size is normal. No increase in right ventricular wall thickness. Right ventricular systolic function is normal. There is normal pulmonary artery systolic pressure. The tricuspid regurgitant velocity is 2.32 m/s, and  with an assumed right atrial pressure of 8 mmHg, the estimated right ventricular systolic pressure is 29.5 mmHg. Left Atrium: Left atrial size was upper normal. Right Atrium: Right atrial size was normal in size. Pericardium: There is no evidence of pericardial effusion. Presence of epicardial fat layer. Mitral Valve: The mitral valve is grossly normal. Mild mitral annular calcification. Trivial mitral valve regurgitation. Tricuspid Valve: The tricuspid valve is grossly normal. Tricuspid valve regurgitation is mild. Aortic Valve: The aortic valve is tricuspid. There is mild aortic valve annular calcification. Aortic valve regurgitation is mild. Aortic regurgitation PHT measures 1003 msec. Aortic valve sclerosis is present, with no evidence of aortic valve stenosis. Pulmonic Valve: The pulmonic valve was not well visualized. Pulmonic valve regurgitation is trivial. Aorta: The aortic root is normal in size and structure. Venous: The inferior vena cava is normal in size with less than 50% respiratory variability, suggesting right atrial pressure of 8 mmHg. IAS/Shunts: No atrial level shunt detected by color flow Doppler.  LEFT VENTRICLE PLAX 2D LVIDd:         3.50 cm   Diastology LVIDs:         2.50 cm   LV e' medial:    3.77 cm/s LV PW:         1.30 cm   LV E/e' medial:  11.0 LV IVS:        1.10 cm   LV e' lateral:   5.96 cm/s LVOT diam:     1.90 cm   LV E/e' lateral: 6.9 LV SV:         69 LV SV Index:   45 LVOT Area:     2.84 cm  RIGHT VENTRICLE RV S prime:     15.70 cm/s TAPSE (M-mode): 1.8 cm LEFT ATRIUM             Index        RIGHT ATRIUM           Index LA diam:        3.70 cm 2.44 cm/m   RA Area:     12.20 cm LA Vol (A2C):   51.9 ml 34.24 ml/m  RA Volume:    28.70 ml  18.94 ml/m LA Vol (A4C):   42.8 ml 28.24 ml/m LA Biplane Vol: 51.9 ml 34.24 ml/m  AORTIC VALVE             PULMONIC VALVE LVOT Vmax:   96.30 cm/s  PR End Diast Vel: 5.20 msec LVOT Vmean:  64.100 cm/s LVOT VTI:    0.243 m AI PHT:      1003 msec  AORTA Ao Root diam: 3.30 cm Ao Asc diam:  3.30 cm MITRAL VALVE  TRICUSPID VALVE MV Area (PHT): 3.53 cm    TR Peak grad:   21.5 mmHg MV Decel Time: 215 msec    TR Vmax:        232.00 cm/s MR Peak grad: 3.4 mmHg MR Vmax:      91.75 cm/s   SHUNTS MV E velocity: 41.40 cm/s  Systemic VTI:  0.24 m MV A velocity: 69.40 cm/s  Systemic Diam: 1.90 cm MV E/A ratio:  0.60 Nona Dell MD Electronically signed by Nona Dell MD Signature Date/Time: 10/18/2022/12:17:26 PM    Final    DG Chest Port 1 View  Result Date: 10/17/2022 CLINICAL DATA:  Shortness of breath, atrial fibrillation. EXAM: PORTABLE CHEST 1 VIEW COMPARISON:  07/07/2022 FINDINGS: Stable heart size and mediastinal contours. No focal airspace disease, pleural effusion or pneumothorax. No pulmonary edema. Mild chronic interstitial coarsening. Overlying snaps. Bilateral shoulder arthropathy. IMPRESSION: No acute chest findings. Mild chronic interstitial coarsening. Electronically Signed   By: Narda Rutherford M.D.   On: 10/17/2022 18:22    Today   Subjective    Heidi Dorsey today has no new complaints - Ambulated with staff, no dyspnea on exertion no palpitations no dizziness O2 sats post ambulation 94 to 95% on room air -No chest pains          Patient has been seen and examined prior to discharge   Objective   Blood pressure (!) 140/54, pulse 62, temperature 97.8 F (36.6 C), temperature source Oral, resp. rate 16, height 5' (1.524 m), weight 60.3 kg, SpO2 99%.   Intake/Output Summary (Last 24 hours) at 11/07/2022 1508 Last data filed at 11/07/2022 1306 Gross per 24 hour  Intake 480 ml  Output 300 ml  Net 180 ml   Exam Gen:- Awake Alert, no acute distress   HEENT:- [place].AT, No sclera icterus Neck-Supple Neck,No JVD,.  Lungs-  CTAB , good air movement bilaterally CV- S1, S2 normal, irregularly irregular  abd-  +ve B.Sounds, Abd Soft, No tenderness,    Extremity/Skin:- No  edema,   good pulses, chronic feet/arch deformities Psych-affect is appropriate, oriented x3 Neuro-generalized weakness, no new focal deficits, no tremors    Data Review   CBC w Diff:  Lab Results  Component Value Date   WBC 6.1 11/05/2022   HGB 11.6 (L) 11/05/2022   HGB 13.2 05/20/2022   HCT 35.5 (L) 11/05/2022   HCT 39.3 05/20/2022   PLT 170 11/05/2022   PLT 198 05/20/2022   LYMPHOPCT 19 10/25/2022   MONOPCT 12 10/25/2022   EOSPCT 1 10/25/2022   BASOPCT 1 10/25/2022   CMP:  Lab Results  Component Value Date   NA 128 (L) 11/07/2022   NA 136 05/20/2022   K 3.9 11/07/2022   CL 100 11/07/2022   CO2 25 11/07/2022   BUN 15 11/07/2022   BUN 15 05/20/2022   CREATININE 0.79 11/07/2022   PROT 6.0 (L) 11/05/2022   PROT 6.7 05/20/2022   ALBUMIN 3.7 11/05/2022   ALBUMIN 4.8 (H) 05/20/2022   BILITOT 0.8 11/05/2022   BILITOT 0.4 05/20/2022   ALKPHOS 51 11/05/2022   AST 17 11/05/2022   ALT 15 11/05/2022  . Total Discharge time is about 33 minutes  Shon Hale M.D on 11/07/2022 at 3:08 PM  Go to [email or web address] -  for contact info  [facility] - Office  [phone]

## 2022-11-09 ENCOUNTER — Telehealth: Payer: Self-pay

## 2022-11-09 NOTE — Transitions of Care (Post Inpatient/ED Visit) (Signed)
11/09/2022  Name: Heidi Dorsey MRN: [id number] DOB: 06/29/1930  Today's TOC FU Call Status: Today's TOC FU Call Status:: Successful TOC FU Call Competed TOC FU Call Complete Date: 11/09/22  Transition Care Management Follow-up Telephone Call Date of Discharge: 11/07/22 [facility] Type of Discharge: Inpatient Admission Primary Inpatient Discharge Diagnosis:: Hyponatremia How have you been since you were released from the hospital?: Better Any questions or concerns?: No  Items Reviewed: Did you receive and understand the discharge instructions provided?: Yes Medications obtained,verified, and reconciled?: Yes (Medications Reviewed) Any new allergies since your discharge?: No Dietary orders reviewed?: Yes Type of Diet Ordered:: Regular Do you have support at home?: Yes People in Home: grandchild(ren) Name of Support/Comfort Primary Source: Heidi Dorsey  Medications Reviewed Today: Medications Reviewed Today     Reviewed by Jodelle Gross, RN (Case Manager) on 11/09/22 at 1209  Med List Status: <None>   Medication Order Taking? Sig Documenting Provider Last Dose Status Informant  acetaminophen (TYLENOL) 325 MG tablet [id number] Yes Take 2 tablets (650 mg total) by mouth every 6 (six) hours as needed for mild pain (or Fever >/= 101). Shon Hale, MD Taking Active   amiodarone (PACERONE) 100 MG tablet [id number] Yes Take 1 tablet (100 mg total) by mouth daily. Shon Hale, MD Taking Active   ASCORBIC ACID PO [id number] Yes Take 1 tablet by mouth daily. OTC vitamin C [provider] Taking Active Self, Pharmacy Records  BIOTIN PO [id number] Yes Take 1 tablet by mouth daily. [provider] Taking Active Self, Pharmacy Records  Cholecalciferol (VITAMIN D-3 PO) [id number] Yes Take 1 capsule by mouth daily. [provider] Taking Active Self, Pharmacy Records  losartan (COZAAR) 50 MG tablet [id number] Yes Take 1 tablet (50 mg  total) by mouth daily. Shon Hale, MD Taking Active   Multiple Vitamins-Minerals (CENTRUM SILVER 50+WOMEN) TABS [id number] Yes Take 1 tablet by mouth daily. [provider] Taking Active Self, Pharmacy Records  Omega-3 Fatty Acids (FISH OIL PO) [id number] Yes Take 1 capsule by mouth daily. [provider] Taking Active Self, Pharmacy Records  sodium chloride 1 g tablet [id number] Yes Take 1 tablet (1 g total) by mouth daily for 7 days. Shon Hale, MD Taking Active             Home Care and Equipment/Supplies: Were Home Health Services Ordered?: Yes Name of Home Health Agency:: Continue with [facility] Has Agency set up a time to come to your home?: Yes (Patient was already active) Any new equipment or medical supplies ordered?: No  Functional Questionnaire: Do you need assistance with bathing/showering or dressing?: No Do you need assistance with meal preparation?: No Do you need assistance with eating?: No Do you have difficulty maintaining continence: No Do you need assistance with getting out of bed/getting out of a chair/moving?: No Do you have difficulty managing or taking your medications?: No  Follow up appointments reviewed: PCP Follow-up appointment confirmed?: Yes Date of PCP follow-up appointment?: 11/18/22 Follow-up Provider: Kari Baars, NP Specialist Hospital Follow-up appointment confirmed?: Yes Date of Specialist follow-up appointment?: 11/22/22 Follow-Up Specialty Provider:: Gwendolyn Lima Do you need transportation to your follow-up appointment?: No Do you understand care options if your condition(s) worsen?: Yes-patient verbalized understanding  SDOH Interventions Today    Flowsheet Row Most Recent Value  SDOH Interventions   Food Insecurity Interventions Intervention Not Indicated  Housing Interventions Intervention Not Indicated  Transportation Interventions Intervention Not Indicated  Utilities Interventions Intervention Not  Indicated  Interventions Today    Flowsheet Row Most Recent Value  General Interventions   General Interventions Discussed/Reviewed Referral to Nurse       [facility] Interventions Today    Flowsheet Row Most Recent Value  TOC Interventions   TOC Interventions Discussed/Reviewed TOC Interventions Discussed, TOC Interventions Reviewed      Jodelle Gross, RN, BSN, CCM Care Management Coordinator [facility]

## 2022-11-12 ENCOUNTER — Telehealth: Payer: Self-pay | Admitting: Family Medicine

## 2022-11-12 ENCOUNTER — Ambulatory Visit (INDEPENDENT_AMBULATORY_CARE_PROVIDER_SITE_OTHER): Payer: Medicare Other

## 2022-11-12 DIAGNOSIS — I10 Essential (primary) hypertension: Secondary | ICD-10-CM | POA: Diagnosis not present

## 2022-11-12 DIAGNOSIS — I480 Paroxysmal atrial fibrillation: Secondary | ICD-10-CM | POA: Diagnosis not present

## 2022-11-12 DIAGNOSIS — I447 Left bundle-branch block, unspecified: Secondary | ICD-10-CM

## 2022-11-12 DIAGNOSIS — F40240 Claustrophobia: Secondary | ICD-10-CM

## 2022-11-12 DIAGNOSIS — H8110 Benign paroxysmal vertigo, unspecified ear: Secondary | ICD-10-CM

## 2022-11-12 DIAGNOSIS — I4892 Unspecified atrial flutter: Secondary | ICD-10-CM | POA: Diagnosis not present

## 2022-11-12 DIAGNOSIS — J45909 Unspecified asthma, uncomplicated: Secondary | ICD-10-CM

## 2022-11-12 DIAGNOSIS — I7300 Raynaud's syndrome without gangrene: Secondary | ICD-10-CM

## 2022-11-12 DIAGNOSIS — R4189 Other symptoms and signs involving cognitive functions and awareness: Secondary | ICD-10-CM

## 2022-11-12 NOTE — Telephone Encounter (Signed)
LIJI (physical therapist with [facility]) called to make PCP aware that pt has refused PT for the whole week. Will try again next week.

## 2022-11-17 ENCOUNTER — Ambulatory Visit: Payer: Self-pay | Admitting: *Deleted

## 2022-11-17 NOTE — Patient Outreach (Signed)
  Care Coordination   Initial Visit Note   11/17/2022 Name: Heidi Dorsey MRN: [id number] DOB: [birth year]  Heidi Dorsey is a 89 y.o. year old female who sees Rakes, Doralee Albino, FNP for primary care. I spoke with  Heidi Dorsey and her daughter Heidi Dorsey by phone today.  What matters to the patients health and wellness today?  Amy comes in twice a week  Not pleasant to interact with her Medications  Family is taking her medications daily to prevent her from skipping  More cognitive with present blood pressure pill give mornings Has Friday, appointment Lonely and depressed [place], daughter requested RN CM put in note to call dtr and/or grandson before changing appt Poor sleep wake up can not go to sleep, naps during  Patient has cancelled her appointments with pcp x 2 without the daughter knowledge  Daughter wonders about dementia she asked the cv sw Dtr visits daily, shops, finances   Goals Addressed   None     SDOH assessments and interventions completed:  Yes{THN Tip this will not be part of the note when signed-REQUIRED REPORT FIELD DO NOT DELETE (Optional):27901}     Care Coordination Interventions:  Yes, provided {THN Tip this will not be part of the note when signed-REQUIRED REPORT FIELD DO NOT DELETE (Optional):27901}  Follow up plan: ***   Encounter Outcome:  Patient Visit Completed {THN Tip this will not be part of the note when signed-REQUIRED REPORT FIELD DO NOT DELETE (Optional):27901}  Marcques Wrightsman L. Noelle Penner, RN, BSN, CCM [facility] RN Care Manager [phone]

## 2022-11-18 ENCOUNTER — Ambulatory Visit: Payer: Medicare Other | Admitting: Family Medicine

## 2022-11-19 ENCOUNTER — Ambulatory Visit (INDEPENDENT_AMBULATORY_CARE_PROVIDER_SITE_OTHER): Payer: Medicare Other | Admitting: Family Medicine

## 2022-11-19 ENCOUNTER — Encounter: Payer: Self-pay | Admitting: Family Medicine

## 2022-11-19 ENCOUNTER — Encounter: Payer: Commercial Managed Care - PPO | Admitting: *Deleted

## 2022-11-19 VITALS — BP 131/62 | HR 53 | Temp 97.0°F | Resp 20 | Ht 60.0 in | Wt 129.4 lb

## 2022-11-19 DIAGNOSIS — I480 Paroxysmal atrial fibrillation: Secondary | ICD-10-CM | POA: Diagnosis not present

## 2022-11-19 DIAGNOSIS — R001 Bradycardia, unspecified: Secondary | ICD-10-CM | POA: Diagnosis not present

## 2022-11-19 DIAGNOSIS — E559 Vitamin D deficiency, unspecified: Secondary | ICD-10-CM

## 2022-11-19 DIAGNOSIS — E871 Hypo-osmolality and hyponatremia: Secondary | ICD-10-CM

## 2022-11-19 DIAGNOSIS — Z09 Encounter for follow-up examination after completed treatment for conditions other than malignant neoplasm: Secondary | ICD-10-CM | POA: Diagnosis not present

## 2022-11-19 DIAGNOSIS — R202 Paresthesia of skin: Secondary | ICD-10-CM

## 2022-11-19 DIAGNOSIS — N1831 Chronic kidney disease, stage 3a: Secondary | ICD-10-CM

## 2022-11-19 NOTE — Progress Notes (Unsigned)
Cardiology Clinic Note   Patient Name: Heidi Dorsey Date of Encounter: 11/19/2022  Primary Care Provider:  Sonny Masters, FNP Primary Cardiologist:  Rollene Rotunda, MD  Patient Profile    Heidi Dorsey 89 year old female presents to the clinic today for follow-up evaluation of her paroxysmal atrial fibrillation.  Past Medical History    Past Medical History:  Diagnosis Date   Asthma    BPPV (benign paroxysmal positional vertigo)    Claustrophobia    Diverticulosis    GERD (gastroesophageal reflux disease)    H/O degenerative disc disease    Hiatal hernia    Hyperlipidemia    Hypertension    Kidney stones    Migraines    Osteoarthritis    Caused feet deformity   Osteopenia    Prediabetes    Preglaucoma    Raynauds syndrome    Rectocele    Rosacea    Sciatica    Sleep apnea    Spinal stenosis    Vertebrobasilar artery syndrome    Past Surgical History:  Procedure Laterality Date   ABDOMINAL HYSTERECTOMY  1978   ANTERIOR (CYSTOCELE) AND POSTERIOR REPAIR (RECTOCELE) WITH XENFORM GRAFT AND SACROSPINOUS FIXATION     BILATERAL OOPHORECTOMY  1997   bladder stones     kidney colic/ procedure performed in 1973 at [facility]   BLADDER SUSPENSION     BREAST BIOPSY Left 12/1987   neg   BREAST BIOPSY Right 02/1977   neg papilloma   CARDIAC CATHETERIZATION  2000   HERNIA REPAIR     plantar fibroma  Left 09/1997   Excised    Allergies  Allergies  Allergen Reactions   Allegra [Fexofenadine] Other (See Comments)    Syncope   Estrace [Estradiol] Other (See Comments)    Headache   Miacalcin [Calcitonin] Palpitations    Left bundle branch block   Nsaids Other (See Comments)    Stomach ulcer after taking for 3 days in 2009. Negative H. Pylori test.   Benadryl [Diphenhydramine] Other (See Comments)    Syncope   Atrovent Hfa [Ipratropium Bromide Hfa] Other (See Comments)    Syncope    Paraphenylenediamine Other (See Comments)    Unknown reaction    Augmentin [Amoxicillin-Pot Clavulanate] Rash    OK to take plain amoxicillin   Biaxin [Clarithromycin] Rash    OK to take azithromycin   Cipro [Ciprofloxacin Hcl] Rash   Codeine Other (See Comments)    Unknown reaction   Epipen [Epinephrine] Other (See Comments)    Unknown reaction   Erythromycin Other (See Comments)    Stomach irritation   Flonase [Fluticasone] Other (See Comments)    Unknown reaction Similar reactions to corticoid steroids   Latex Rash   Macrobid [Nitrofurantoin] Other (See Comments)    Multiple mild symptoms - would prefer not to take again   Other Other (See Comments)    Propanol amine Anti-cholinergic compounds   Silenor [Doxepin Hcl] Other (See Comments)    Heart arrhythmia   Sulfa Antibiotics Other (See Comments)    Unknown reaction   Sumycin [Tetracycline] Other (See Comments)    Mouth sores OK to take doxycycline   Zestril [Lisinopril] Other (See Comments)    Bruising Cankers Arthralgias    History of Present Illness    Heidi Dorsey has a PMH of paroxysmal atrial fibrillation/flutter, tachybradycardia syndrome, hypertension, hyperlipidemia, GERD, left bundle branch block, and asthma.  She was admitted to the hospital 10/17/2022 through 10/20/2022.  Cardiology was consulted  for evaluation of palpitations and dizziness.  She was found to be in atrial flutter with RVR.  She was initially placed on Cardizem but developed bradycardia when she converted to sinus rhythm.  Her heart rate was noted to be in the 40s.  She was switched to amiodarone 200 mg twice daily.  She was maintaining sinus rhythm at the time of discharge.  She was not started on anticoagulation due to fall risk and previously declining Watchman device placement.  She was also educated about PPM.  She declined this as well.  She was noted to have episodes of AMS during admission which was felt to be acute delirium but possibly related to underlying dementia.  She was discharged on  amiodarone 200 mg twice daily for 7 days followed by 200 mg daily.    She presented back to the emergency department on 10/25/2022.  She reported dizziness and foggy vision.  Her EKG showed frequent PVCs.  She was maintaining normal sinus rhythm.  She declined MRI.  She was again seen in the emergency department on 11/04/2022.  She reported similar complaints of dizziness and foggy vision.  Her lab work showed WBCs 5.3, hemoglobin 12.2 and platelets 183.  Her sodium was noted to be 123.  Her head CT showed no acute intercranial abnormalities.  Her EKG showed sinus bradycardia 47 bpm with left bundle branch block.  She denied chest pain, palpitations, orthopnea, PND, and lower extremity swelling.  She was alert and oriented x 3.  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  She was noted to have short episode of SVT but was found to have predominantly normal sinus rhythm.  (10/20/2022) her medication was continued.  Paroxysmal atrial fibrillation-heart rate today***.  Cardiac event monitor reassuring.  Details above.  No anticoagulation due to fall risk, declined Watchman device placement.  CHA2DS2-VASc score 4 Continue amiodarone Heart healthy diet Avoid triggers caffeine, chocolate, EtOH, dehydration  Left bundle branch block-echocardiogram showed EF 60-65% and no regional wall motion abnormalities.  Avoid AV nodal blocking agents Continue current medical therapy Will not pursue ischemic evaluation at this time.  Essential hypertension-BP today***. Maintain blood pressure log Continue losartan  History of hyponatremia-recent admission noted sodium of 123 which improved to 129 after receiving IV fluids Follows with PCP  Disposition : Follow-up with Dr. Antoine Poche in 3-4 months.   Home Medications    Prior to Admission  medications   Medication Sig Start Date End Date Taking? Authorizing Provider  acetaminophen (TYLENOL) 325 MG tablet Take 2 tablets (650 mg total) by mouth every 6 (six) hours as needed for mild pain (or Fever >/= 101). 11/07/22   Emokpae, Courage, MD  amiodarone (PACERONE) 100 MG tablet Take 1 tablet (100 mg total) by mouth daily. 11/08/22   Shon Hale, MD  ASCORBIC ACID PO Take 1 tablet by mouth daily. OTC vitamin C    [provider]  BIOTIN PO Take 1 tablet by mouth daily.    [provider]  Cholecalciferol (VITAMIN D-3 PO) Take 1 capsule by mouth daily.    [provider]  losartan (COZAAR) 50 MG tablet Take 1 tablet (50 mg total) by mouth daily. 11/07/22 12/07/22  Shon Hale, MD  Multiple Vitamins-Minerals (CENTRUM SILVER 50+WOMEN) TABS Take 1 tablet by mouth daily.    [provider]  Omega-3 Fatty Acids (FISH OIL PO) Take 1 capsule by mouth daily.    [provider]    Family History    Family History  Problem Relation Age of Onset   Osteoporosis Mother    Ovarian cancer Mother    Uterine cancer Mother    Brain cancer Father    Bladder Cancer Brother    Rectal cancer Brother    Skin cancer Brother    Osteoporosis Sister    Diabetes Paternal Grandmother    Breast cancer Neg Hx    She indicated that her mother is deceased. She indicated that her father is deceased. She indicated that the status of her sister is unknown. She indicated that the status of her brother is unknown. She indicated that the status of her paternal grandmother is unknown. She indicated that the status of her neg hx is unknown.  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    Drinks [employer] Coffee and Tea   Originally from [place], then lived in [place] most of her life - moved here 09/2019 - feels out of place, having trouble finding others with similar interests   She is agnostic - not religious (although raised Catholic) - strong scientific beliefs    Social Determinants of Health   Financial Resource Strain: Low Risk  (06/07/2022)   Overall Financial Resource Strain (CARDIA)    Difficulty of Paying Living Expenses: Not hard at all  Food Insecurity: No Food Insecurity (11/09/2022)   Hunger Vital Sign    Worried About Running Out of Food in the Last Year: Never true    Ran Out of Food in the Last Year: Never true  Transportation Needs: No Transportation Needs (11/09/2022)   PRAPARE - Administrator, Civil Service (Medical): No    Lack of Transportation (Non-Medical): No  Physical Activity: Insufficiently Active (06/07/2022)   Exercise Vital Sign    Days of Exercise per Week: 3 days    Minutes of Exercise per Session: 30 min  Stress: No Stress Concern Present (06/07/2022)   [employer] of Occupational Health - Occupational Stress Questionnaire    Feeling of Stress : Not at all  Social Connections: Socially Isolated (06/07/2022)   Social Connection and Isolation Panel [NHANES]    Frequency of Communication with Friends and Family: More than three times a week    Frequency of Social Gatherings with Friends and Family: More than three times  a week    Attends Religious Services: Never    Active  Member of Clubs or Organizations: No    Attends Banker Meetings: Never    Marital Status: Widowed  Intimate Partner Violence: Not At Risk (11/05/2022)   Humiliation, Afraid, Rape, and Kick questionnaire    Fear of Current or Ex-Partner: No    Emotionally Abused: No    Physically Abused: No    Sexually Abused: No     Review of Systems    General:  No chills, fever, night sweats or weight changes.  Cardiovascular:  No chest pain, dyspnea on exertion, edema, orthopnea, palpitations, paroxysmal nocturnal dyspnea. Dermatological: No rash, lesions/masses Respiratory: No cough, dyspnea Urologic: No hematuria, dysuria Abdominal:   No nausea, vomiting, diarrhea, bright red blood per rectum, melena, or hematemesis Neurologic:  No visual changes, wkns, changes in mental status. All other systems reviewed and are otherwise negative except as noted above.  Physical Exam    VS:  There were no vitals taken for this visit. , BMI There is no height or weight on file to calculate BMI. GEN: Well nourished, well developed, in no acute distress. HEENT: normal. Neck: Supple, no JVD, carotid bruits, or masses. Cardiac: RRR, no murmurs, rubs, or gallops. No clubbing, cyanosis, edema.  Radials/DP/PT 2+ and equal bilaterally.  Respiratory:  Respirations regular and unlabored, clear to auscultation bilaterally. GI: Soft, nontender, nondistended, BS + x 4. MS: no deformity or atrophy. Skin: warm and dry, no rash. Neuro:  Strength and sensation are intact. Psych: Normal affect.  Accessory Clinical Findings    Recent Labs: 10/17/2022: B Natriuretic Peptide 124.0 10/19/2022: TSH 2.623 11/05/2022: ALT 15; Hemoglobin 11.6; Magnesium 2.1; Platelets 170 11/07/2022: BUN 15; Creatinine, Ser 0.79; Potassium 3.9; Sodium 128   Recent Lipid Panel    Component Value Date/Time   CHOL 200 (H) 06/30/2021 1441   TRIG 47 06/30/2021 1441   HDL 91  06/30/2021 1441   CHOLHDL 2.2 06/30/2021 1441   LDLCALC 100 (H) 06/30/2021 1441    No BP recorded.  {Refresh Note OR Click here to enter BP  :1}***    ECG personally reviewed by me today- ***    Echocardiogram: 10/2022 IMPRESSIONS     1. Left ventricular ejection fraction, by estimation, is 60 to 65%. The  left ventricle has normal function. The left ventricle has no regional  wall motion abnormalities. There is mild concentric left ventricular  hypertrophy. Left ventricular diastolic  parameters are consistent with Grade I diastolic dysfunction (impaired  relaxation).   2. Right ventricular systolic function is normal. The right ventricular  size is normal. There is normal pulmonary artery systolic pressure. The  estimated right ventricular systolic pressure is 29.5 mmHg.   3. Left atrial size was upper normal.   4. The mitral valve is grossly normal. Trivial mitral valve  regurgitation.   5. The aortic valve is tricuspid. Aortic valve regurgitation is mild.  Aortic valve sclerosis is present, with no evidence of aortic valve  stenosis. Aortic regurgitation PHT measures 1003 msec.   6. The inferior vena cava is normal in size with <50% respiratory  variability, suggesting right atrial pressure of 8 mmHg.   Comparison(s): No prior Echocardiogram.   Cardiac event monitor 11/17/2022  Predominant rhythm was normal sinus Two runs of SVT with the longest bine 13 beats Atrial flutter was less than  infrequent with the longest lasting being 49 seconds.       Assessment & Plan   1.  ***   Heidi Dorsey.  NP-C     11/19/2022,  7:46 AM [facility] [address] Office [phone] Fax [phone]    I spent***minutes examining this patient, reviewing medications, and using patient centered shared decision making involving her cardiac care.  Prior to her visit I spent greater than 20 minutes reviewing her past medical history,   medications, and prior cardiac tests.

## 2022-11-19 NOTE — Progress Notes (Signed)
Subjective:  Patient ID: Heidi Dorsey, female    DOB: 10/04/1930, 89 y.o.   MRN: [id number]  Patient Care Team: Sonny Masters, FNP as PCP - General (Family Medicine) Rollene Rotunda, MD as PCP - Cardiology (Cardiology) Glendale Chard, DO as Consulting Physician (Neurology) Clinton Gallant, RN as [facility] Care Management [place], Fanny Dance, LCSW as [facility] Care Management (Licensed Clinical Social Worker)   Chief Complaint:  Hospitalization Follow-up   HPI: Heidi Dorsey is a 89 y.o. female presenting on 11/19/2022 for Hospitalization Follow-up   Pt presents today for hospital discharge follow up. She was admitted to [facility] 11/04/2022 for hyponatremia, encephalopathy related to hyponatremia, A-Flutter, bradycardia, and hypertension. Her sodium was repleted during hospital stay and she returned to baseline. During admission she had bradycardia so her Pacerone dose was decreased to 100 mg. Her losartan was increased to 50 mg once daily instead of 25 mg twice daily. She declined anticoagulation or Watchman device. She was discharged home 11/07/2022. She reports ongoing unsteady gait, reports dizziness has resolved. She has not increased sodium intake and completed sodium tablets on Sunday. She is now complaining of paresthesias to her left hand, right at times. Nothing seems to worsen or improve these symptoms. Denies injury. Denies falls since being home. She has declined to restart PT at home.   Relevant past medical, surgical, family, and social history reviewed and updated as indicated.  Allergies and medications reviewed and updated. Data reviewed: Chart in Epic.   Past Medical History:  Diagnosis Date   Asthma    BPPV (benign paroxysmal positional vertigo)    Claustrophobia    Diverticulosis    GERD (gastroesophageal reflux disease)    H/O degenerative disc disease    Hiatal hernia    Hyperlipidemia    Hypertension    Kidney  stones    Migraines    Osteoarthritis    Caused feet deformity   Osteopenia    Prediabetes    Preglaucoma    Raynauds syndrome    Rectocele    Rosacea    Sciatica    Sleep apnea    Spinal stenosis    Vertebrobasilar artery syndrome     Past Surgical History:  Procedure Laterality Date   ABDOMINAL HYSTERECTOMY  1978   ANTERIOR (CYSTOCELE) AND POSTERIOR REPAIR (RECTOCELE) WITH XENFORM GRAFT AND SACROSPINOUS FIXATION     BILATERAL OOPHORECTOMY  1997   bladder stones     kidney colic/ procedure performed in 1973 at [facility]   BLADDER SUSPENSION     BREAST BIOPSY Left 12/1987   neg   BREAST BIOPSY Right 02/1977   neg papilloma   CARDIAC CATHETERIZATION  2000   HERNIA REPAIR     plantar fibroma  Left 09/1997   Excised    Social History   Socioeconomic History   Marital status: Single    Spouse name: Not on file   Number of children: Not on file   Years of education: Not on file   Highest education level: Not on file  Occupational History   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    Drinks Half [employer] Coffee and Tea   Originally from [place], then lived in [place] most of her life - moved here 09/2019 - feels out of place, having trouble finding others with similar interests   She is agnostic - not religious (although raised Catholic) - strong scientific beliefs    Social Determinants of Health   Financial Resource Strain: Low Risk   (06/07/2022)   Overall Financial Resource Strain (CARDIA)    Difficulty of Paying Living Expenses: Not hard at all  Food Insecurity: No Food Insecurity (11/09/2022)   Hunger Vital Sign    Worried About Running Out of Food in the Last Year: Never true    Ran Out of Food in the Last Year: Never true  Transportation Needs: No Transportation Needs (11/09/2022)   PRAPARE - Administrator, Civil Service (Medical): No    Lack of Transportation (Non-Medical): No  Physical Activity: Insufficiently Active (06/07/2022)   Exercise Vital Sign    Days of Exercise per Week: 3 days    Minutes of Exercise per Session: 30 min  Stress: No Stress Concern Present (06/07/2022)   [employer] of Occupational Health - Occupational Stress Questionnaire    Feeling of Stress : Not at all  Social Connections: Socially Isolated (06/07/2022)   Social Connection and Isolation Panel [NHANES]    Frequency of Communication with Friends and Family: More than three times a week    Frequency of Social Gatherings with Friends and Family: More than three times a week    Attends Religious Services: Never    Database administrator or Organizations: No    Attends Banker Meetings: Never    Marital Status: Widowed  Intimate Partner Violence: Not At Risk (11/05/2022)   Humiliation, Afraid, Rape, and Kick questionnaire    Fear of Current or Ex-Partner: No    Emotionally Abused: No    Physically Abused: No    Sexually Abused: No    Outpatient Encounter Medications as of 11/19/2022  Medication Sig   acetaminophen (TYLENOL) 325 MG tablet Take 2 tablets (650 mg total) by mouth every 6 (six) hours as needed for mild pain (or Fever >/= 101).   amiodarone (PACERONE) 100 MG tablet Take 1 tablet (100 mg total) by mouth daily.   ASCORBIC ACID PO Take 1 tablet by mouth daily. OTC vitamin C   BIOTIN PO Take 1 tablet by mouth daily.   Cholecalciferol (VITAMIN D-3 PO) Take 1 capsule by mouth daily.   losartan  (COZAAR) 50 MG tablet Take 1 tablet (50 mg total) by mouth daily.   Multiple Vitamins-Minerals (CENTRUM SILVER 50+WOMEN) TABS Take 1 tablet by mouth daily.   Omega-3 Fatty Acids (FISH OIL PO) Take 1 capsule by mouth daily.   No facility-administered encounter medications on file as of 11/19/2022.    Allergies  Allergen Reactions   Allegra [Fexofenadine] Other (See Comments)    Syncope   Estrace [Estradiol] Other (See Comments)    Headache   Miacalcin [Calcitonin] Palpitations    Left bundle branch block   Nsaids Other (See Comments)    Stomach ulcer after taking for 3 days in 2009. Negative H. Pylori  test.   Benadryl [Diphenhydramine] Other (See Comments)    Syncope   Atrovent Hfa [Ipratropium Bromide Hfa] Other (See Comments)    Syncope    Paraphenylenediamine Other (See Comments)    Unknown reaction   Augmentin [Amoxicillin-Pot Clavulanate] Rash    OK to take plain amoxicillin   Biaxin [Clarithromycin] Rash    OK to take azithromycin   Cipro [Ciprofloxacin Hcl] Rash   Codeine Other (See Comments)    Unknown reaction   Epipen [Epinephrine] Other (See Comments)    Unknown reaction   Erythromycin Other (See Comments)    Stomach irritation   Flonase [Fluticasone] Other (See Comments)    Unknown reaction Similar reactions to corticoid steroids   Latex Rash   Macrobid [Nitrofurantoin] Other (See Comments)    Multiple mild symptoms - would prefer not to take again   Other Other (See Comments)    Propanol amine Anti-cholinergic compounds   Silenor [Doxepin Hcl] Other (See Comments)    Heart arrhythmia   Sulfa Antibiotics Other (See Comments)    Unknown reaction   Sumycin [Tetracycline] Other (See Comments)    Mouth sores OK to take doxycycline   Zestril [Lisinopril] Other (See Comments)    Bruising Cankers Arthralgias    Review of Systems  Constitutional:  Negative for activity change, appetite change, chills, diaphoresis, fatigue, fever and unexpected weight change.   HENT: Negative.    Eyes: Negative.  Negative for photophobia and visual disturbance.  Respiratory:  Negative for cough, chest tightness and shortness of breath.   Cardiovascular:  Negative for chest pain, palpitations and leg swelling.  Gastrointestinal:  Negative for abdominal distention, abdominal pain, anal bleeding, blood in stool, constipation, diarrhea, nausea, rectal pain and vomiting.  Endocrine: Negative.   Genitourinary:  Negative for decreased urine volume, difficulty urinating, dysuria, frequency and urgency.  Musculoskeletal:  Positive for gait problem. Negative for arthralgias, back pain, joint swelling, myalgias, neck pain and neck stiffness.  Skin: Negative.   Allergic/Immunologic: Negative.   Neurological:  Positive for dizziness and numbness (left hand, right at times). Negative for tremors, seizures, syncope, facial asymmetry, speech difficulty, weakness, light-headedness and headaches.  Hematological: Negative.   Psychiatric/Behavioral:  Negative for confusion, hallucinations, sleep disturbance and suicidal ideas.   All other systems reviewed and are negative.       Objective:  BP 131/62   Pulse (!) 53   Temp (!) 97 F (36.1 C) (Oral)   Resp 20   Ht 5' (1.524 m)   Wt 129 lb 6 oz (58.7 kg)   SpO2 97%   BMI 25.27 kg/m    Wt Readings from Last 3 Encounters:  11/19/22 129 lb 6 oz (58.7 kg)  11/05/22 132 lb 15 oz (60.3 kg)  10/19/22 126 lb 5.2 oz (57.3 kg)    Physical Exam Vitals and nursing note reviewed.  Constitutional:      General: She is not in acute distress.    Appearance: Normal appearance. She is not ill-appearing, toxic-appearing or diaphoretic.  HENT:     Head: Normocephalic and atraumatic.     Nose: Nose normal.     Mouth/Throat:     Mouth: Mucous membranes are moist.  Eyes:     Pupils: Pupils are equal, round, and reactive to light.  Cardiovascular:     Rate and Rhythm: Normal rate. Rhythm irregularly irregular.     Heart sounds: Normal  heart sounds.  Pulmonary:     Effort: Pulmonary effort is normal.  Breath sounds: Normal breath sounds.  Skin:    General: Skin is warm and dry.     Capillary Refill: Capillary refill takes less than 2 seconds.  Neurological:     General: No focal deficit present.     Mental Status: She is alert.  Psychiatric:        Mood and Affect: Mood normal. Affect is angry.        Behavior: Behavior normal.        Thought Content: Thought content normal.        Cognition and Memory: Memory is impaired.     Results for orders placed or performed during the hospital encounter of 11/04/22  Basic metabolic panel  Result Value Ref Range   Sodium 123 (L) 135 - 145 mmol/L   Potassium 4.1 3.5 - 5.1 mmol/L   Chloride 91 (L) 98 - 111 mmol/L   CO2 26 22 - 32 mmol/L   Glucose, Bld 108 (H) 70 - 99 mg/dL   BUN 17 8 - 23 mg/dL   Creatinine, Ser 5.42 0.44 - 1.00 mg/dL   Calcium 8.9 8.9 - 70.6 mg/dL   GFR, Estimated >23 >76 mL/min   Anion gap 6 5 - 15  CBC  Result Value Ref Range   WBC 5.3 4.0 - 10.5 K/uL   RBC 4.11 3.87 - 5.11 MIL/uL   Hemoglobin 12.2 12.0 - 15.0 g/dL   HCT 28.3 15.1 - 76.1 %   MCV 90.8 80.0 - 100.0 fL   MCH 29.7 26.0 - 34.0 pg   MCHC 32.7 30.0 - 36.0 g/dL   RDW 60.7 37.1 - 06.2 %   Platelets 183 150 - 400 K/uL   nRBC 0.0 0.0 - 0.2 %  Osmolality  Result Value Ref Range   Osmolality 276 275 - 295 mOsm/kg  Osmolality, urine  Result Value Ref Range   Osmolality, Ur 373 300 - 900 mOsm/kg  Sodium, urine, random  Result Value Ref Range   Sodium, Ur 30 mmol/L  Comprehensive metabolic panel  Result Value Ref Range   Sodium 129 (L) 135 - 145 mmol/L   Potassium 4.1 3.5 - 5.1 mmol/L   Chloride 98 98 - 111 mmol/L   CO2 22 22 - 32 mmol/L   Glucose, Bld 92 70 - 99 mg/dL   BUN 14 8 - 23 mg/dL   Creatinine, Ser 6.94 0.44 - 1.00 mg/dL   Calcium 8.5 (L) 8.9 - 10.3 mg/dL   Total Protein 6.0 (L) 6.5 - 8.1 g/dL   Albumin 3.7 3.5 - 5.0 g/dL   AST 17 15 - 41 U/L   ALT 15 0 - 44 U/L    Alkaline Phosphatase 51 38 - 126 U/L   Total Bilirubin 0.8 0.3 - 1.2 mg/dL   GFR, Estimated >85 >46 mL/min   Anion gap 9 5 - 15  CBC  Result Value Ref Range   WBC 6.1 4.0 - 10.5 K/uL   RBC 3.96 3.87 - 5.11 MIL/uL   Hemoglobin 11.6 (L) 12.0 - 15.0 g/dL   HCT 27.0 (L) 35.0 - 09.3 %   MCV 89.6 80.0 - 100.0 fL   MCH 29.3 26.0 - 34.0 pg   MCHC 32.7 30.0 - 36.0 g/dL   RDW 81.8 29.9 - 37.1 %   Platelets 170 150 - 400 K/uL   nRBC 0.0 0.0 - 0.2 %  Magnesium  Result Value Ref Range   Magnesium 2.1 1.7 - 2.4 mg/dL  Phosphorus  Result Value Ref Range  Phosphorus 3.0 2.5 - 4.6 mg/dL  Basic metabolic panel  Result Value Ref Range   Sodium 128 (L) 135 - 145 mmol/L   Potassium 3.9 3.5 - 5.1 mmol/L   Chloride 100 98 - 111 mmol/L   CO2 25 22 - 32 mmol/L   Glucose, Bld 104 (H) 70 - 99 mg/dL   BUN 15 8 - 23 mg/dL   Creatinine, Ser 1.61 0.44 - 1.00 mg/dL   Calcium 8.1 (L) 8.9 - 10.3 mg/dL   GFR, Estimated >09 >60 mL/min   Anion gap 3 (L) 5 - 15       Pertinent labs & imaging results that were available during my care of the patient were reviewed by me and considered in my medical decision making.  Assessment & Plan:  Jahia was seen today for hospitalization follow-up.  Diagnoses and all orders for this visit:  Hyponatremia Repeat labs today. May need to restart sodium repletion.  -     CMP14+EGFR -     Magnesium  Hospital discharge follow-up Labs as ordered.  -     Anemia Profile B -     CMP14+EGFR -     Thyroid Panel With TSH -     VITAMIN D 25 Hydroxy (Vit-D Deficiency, Fractures)  Bradycardia Doing well since decreased Pacerone dose.   PAF (paroxysmal atrial fibrillation) (HCC) Doing well since decreased Pacerone dose.   Stage 3a chronic kidney disease (HCC) Will repeat labs today.  -     Anemia Profile B -     CMP14+EGFR -     VITAMIN D 25 Hydroxy (Vit-D Deficiency, Fractures)  Paresthesias Will check for potential underlying causes. Aware to report new,  worsening, or persistent symptoms.  -     Anemia Profile B -     CMP14+EGFR -     Thyroid Panel With TSH -     VITAMIN D 25 Hydroxy (Vit-D Deficiency, Fractures) -     Magnesium  Vitamin D deficiency Labs pending. Continue repletion therapy. If indicated, will change repletion dosage. Eat foods rich in Vit D including milk, orange juice, yogurt with vitamin D added, salmon or mackerel, canned tuna fish, cereals with vitamin D added, and cod liver oil. Get out in the sun but make sure to wear at least SPF 30 sunscreen.  -     CMP14+EGFR -     VITAMIN D 25 Hydroxy (Vit-D Deficiency, Fractures) -     Magnesium     Continue all other maintenance medications.  Follow up plan: Return in about 3 months (around 02/19/2023) for chronic follow up.   Continue healthy lifestyle choices, including diet (rich in fruits, vegetables, and lean proteins, and low in salt and simple carbohydrates) and exercise (at least 30 minutes of moderate physical activity daily).  Educational handout given for hyponatremia  The above assessment and management plan was discussed with the patient. The patient verbalized understanding of and has agreed to the management plan. Patient is aware to call the clinic if they develop any new symptoms or if symptoms persist or worsen. Patient is aware when to return to the clinic for a follow-up visit. Patient educated on when it is appropriate to go to the emergency department.   Kari Baars, FNP-C [facility] [phone]

## 2022-11-22 ENCOUNTER — Ambulatory Visit: Payer: Medicare Other | Attending: General Practice | Admitting: General Practice

## 2022-11-22 ENCOUNTER — Encounter: Payer: Self-pay | Admitting: General Practice

## 2022-11-22 VITALS — BP 118/74 | HR 67 | Ht 60.0 in | Wt 130.2 lb

## 2022-11-22 DIAGNOSIS — I10 Essential (primary) hypertension: Secondary | ICD-10-CM | POA: Insufficient documentation

## 2022-11-22 DIAGNOSIS — I480 Paroxysmal atrial fibrillation: Secondary | ICD-10-CM | POA: Insufficient documentation

## 2022-11-22 DIAGNOSIS — I447 Left bundle-branch block, unspecified: Secondary | ICD-10-CM | POA: Insufficient documentation

## 2022-11-22 NOTE — Patient Instructions (Addendum)
Medication Instructions:  NO CHANGES  *If you need a refill on your cardiac medications before your next appointment, please call your pharmacy*    Follow-Up: At [facility], you and your health needs are our priority.  As part of our continuing mission to provide you with exceptional heart care, we have created designated Provider Care Teams.  These Care Teams include your primary Cardiologist (physician) and Advanced Practice Providers (APPs -  Physician Assistants and Nurse Practitioners) who all work together to provide you with the care you need, when you need it.  We recommend signing up for the patient portal called "MyChart".  Sign up information is provided on this After Visit Summary.  MyChart is used to connect with patients for Virtual Visits (Telemedicine).  Patients are able to view lab/test results, encounter notes, upcoming appointments, etc.  Non-urgent messages can be sent to your provider as well.   To learn more about what you can do with MyChart, go to [email or web address].    Your next appointment:    3-4 months with Dr. Antoine Poche in [place]

## 2022-11-23 ENCOUNTER — Telehealth: Payer: Self-pay | Admitting: Family Medicine

## 2022-11-23 ENCOUNTER — Encounter: Payer: Self-pay | Admitting: *Deleted

## 2022-11-23 MED ORDER — SODIUM CHLORIDE 1 G PO TABS: 1.0000 g | ORAL_TABLET | ORAL | 0 refills | Status: DC

## 2022-11-23 NOTE — Addendum Note (Signed)
Addended by: Sonny Masters on: 11/23/2022 10:03 AM   Modules accepted: Orders

## 2022-11-24 NOTE — Telephone Encounter (Signed)
Sodium is low. I have added three times weekly sodium to keep this from dropping lower. This is not daily but three times weekly.

## 2022-11-24 NOTE — Telephone Encounter (Signed)
Yes, that will be fine.

## 2022-11-24 NOTE — Telephone Encounter (Signed)
Left message for daughter Wynelle Cleveland to return call.

## 2022-11-24 NOTE — Telephone Encounter (Signed)
Daughter aware and verbalizes understanding.  States that she comes back in 3 weeks - is it ok to re check at 3 weeks instead of 2?

## 2022-11-24 NOTE — Telephone Encounter (Signed)
Daughter call requesting to speak with nurse. Says to leave a detailed message so she is not playing phone tag with nurse.

## 2022-11-24 NOTE — Telephone Encounter (Signed)
Daughter, Wynelle Cleveland made aware.  Per daughter Marcelino Duster is supposed to do an assessment on memory at pts next visit. Daughter said that Marcelino Duster is going to do an assessment without the pt knowing.

## 2022-11-29 ENCOUNTER — Telehealth: Payer: Self-pay

## 2022-11-29 ENCOUNTER — Telehealth: Payer: Self-pay | Admitting: General Practice

## 2022-11-29 NOTE — Telephone Encounter (Signed)
Returned call to pt. Call was forwarded to VM. LVM for pt to call back.

## 2022-11-29 NOTE — Telephone Encounter (Signed)
Pt c/o BP issue: STAT if pt c/o blurred vision, one-sided weakness or slurred speech  1. What are your last 5 BP readings? 130/51  2. Are you having any other symptoms (ex. Dizziness, headache, blurred vision, passed out)? Balance problems, eye site blurred  3. What is your BP issue? Pt is concerned after just being seen in office last week. Please advise

## 2022-11-29 NOTE — Telephone Encounter (Signed)
Patient calls to report that she has not felt well today, slightly dizzy and light headed and reports that her blood pressure is 130/51 and pulse rate is 46.  Patient has recently been hospitalized for bradycardia and I advised her to contact her cardiologist office to let them know how she is feeling and her vital signs and to see what they recommend she do.

## 2022-11-30 NOTE — Telephone Encounter (Signed)
Patient had forgotten why she called. She states she has not had any issues.  She states PT took her BP and it was OK.  Advised to continue to take her BP when possible and call if any concerns.

## 2022-12-02 ENCOUNTER — Encounter (HOSPITAL_COMMUNITY): Payer: Self-pay

## 2022-12-02 ENCOUNTER — Emergency Department (HOSPITAL_COMMUNITY): Payer: Medicare Other

## 2022-12-02 ENCOUNTER — Other Ambulatory Visit: Payer: Self-pay

## 2022-12-02 ENCOUNTER — Emergency Department (HOSPITAL_COMMUNITY)
Admission: EM | Admit: 2022-12-02 | Discharge: 2022-12-02 | Disposition: A | Discharge status: Home / Self Care | Payer: Medicare Other | Attending: Emergency Medicine | Admitting: Emergency Medicine

## 2022-12-02 DIAGNOSIS — Z20822 Contact with and (suspected) exposure to covid-19: Secondary | ICD-10-CM | POA: Insufficient documentation

## 2022-12-02 DIAGNOSIS — Z79899 Other long term (current) drug therapy: Secondary | ICD-10-CM | POA: Diagnosis not present

## 2022-12-02 DIAGNOSIS — R42 Dizziness and giddiness: Secondary | ICD-10-CM | POA: Diagnosis present

## 2022-12-02 DIAGNOSIS — I129 Hypertensive chronic kidney disease with stage 1 through stage 4 chronic kidney disease, or unspecified chronic kidney disease: Secondary | ICD-10-CM | POA: Diagnosis not present

## 2022-12-02 DIAGNOSIS — E871 Hypo-osmolality and hyponatremia: Secondary | ICD-10-CM | POA: Insufficient documentation

## 2022-12-02 DIAGNOSIS — N189 Chronic kidney disease, unspecified: Secondary | ICD-10-CM | POA: Insufficient documentation

## 2022-12-02 DIAGNOSIS — J45909 Unspecified asthma, uncomplicated: Secondary | ICD-10-CM | POA: Diagnosis not present

## 2022-12-02 LAB — CBC WITH DIFFERENTIAL/PLATELET
Abs Immature Granulocytes: 0.04 10*3/uL (ref 0.00–0.07)
Basophils Absolute: 0.1 10*3/uL (ref 0.0–0.1)
Basophils Relative: 1 %
Eosinophils Absolute: 0.1 10*3/uL (ref 0.0–0.5)
Eosinophils Relative: 1 %
HCT: 35.7 % — ABNORMAL LOW (ref 36.0–46.0)
Hemoglobin: 11.9 g/dL — ABNORMAL LOW (ref 12.0–15.0)
Immature Granulocytes: 1 %
Lymphocytes Relative: 16 %
Lymphs Abs: 0.9 10*3/uL (ref 0.7–4.0)
MCH: 30.4 pg (ref 26.0–34.0)
MCHC: 33.3 g/dL (ref 30.0–36.0)
MCV: 91.3 fL (ref 80.0–100.0)
Monocytes Absolute: 0.6 10*3/uL (ref 0.1–1.0)
Monocytes Relative: 11 %
Neutro Abs: 4 10*3/uL (ref 1.7–7.7)
Neutrophils Relative %: 70 %
Platelets: 208 10*3/uL (ref 150–400)
RBC: 3.91 MIL/uL (ref 3.87–5.11)
RDW: 15.4 % (ref 11.5–15.5)
WBC: 5.7 10*3/uL (ref 4.0–10.5)
nRBC: 0 % (ref 0.0–0.2)

## 2022-12-02 LAB — COMPREHENSIVE METABOLIC PANEL
ALT: 18 U/L (ref 0–44)
AST: 17 U/L (ref 15–41)
Albumin: 3.7 g/dL (ref 3.5–5.0)
Alkaline Phosphatase: 59 U/L (ref 38–126)
Anion gap: 7 (ref 5–15)
BUN: 19 mg/dL (ref 8–23)
CO2: 26 mmol/L (ref 22–32)
Calcium: 8.8 mg/dL — ABNORMAL LOW (ref 8.9–10.3)
Chloride: 97 mmol/L — ABNORMAL LOW (ref 98–111)
Creatinine, Ser: 0.79 mg/dL (ref 0.44–1.00)
GFR, Estimated: >60 mL/min (ref >60)
Glucose, Bld: 97 mg/dL (ref 70–99)
Potassium: 4.4 mmol/L (ref 3.5–5.1)
Sodium: 130 mmol/L — ABNORMAL LOW (ref 135–145)
Total Bilirubin: 0.5 mg/dL (ref 0.3–1.2)
Total Protein: 6.1 g/dL — ABNORMAL LOW (ref 6.5–8.1)

## 2022-12-02 LAB — URINALYSIS, ROUTINE W REFLEX MICROSCOPIC
Bilirubin Urine: NEGATIVE
Glucose, UA: NEGATIVE mg/dL
Hgb urine dipstick: NEGATIVE
Ketones, ur: NEGATIVE mg/dL
Leukocytes,Ua: NEGATIVE
Nitrite: NEGATIVE
Protein, ur: NEGATIVE mg/dL
Specific Gravity, Urine: 1.006 (ref 1.005–1.030)
pH: 6 (ref 5.0–8.0)

## 2022-12-02 LAB — SARS CORONAVIRUS 2 BY RT PCR: SARS Coronavirus 2 by RT PCR: NEGATIVE

## 2022-12-02 LAB — MAGNESIUM: Magnesium: 2.1 mg/dL (ref 1.7–2.4)

## 2022-12-02 MED ORDER — SODIUM CHLORIDE 0.9 % IV BOLUS
500.0000 mL | Freq: Once | INTRAVENOUS | Status: AC
  Administered 2022-12-02: 500 mL via INTRAVENOUS

## 2022-12-02 NOTE — ED Notes (Signed)
Pt in MRI.

## 2022-12-02 NOTE — ED Notes (Signed)
Pt independently walked to bathroom with her cane. Pt request food, RN aware

## 2022-12-02 NOTE — ED Triage Notes (Signed)
"  Was having blurred vision and when walking I felt like I was having balance issues" per pt  Per EMS patient was walking around house with a cane on their arrival, was a-fib on monitor with history of same   A&O x 4, no neuro deficits noted on arrival.

## 2022-12-02 NOTE — ED Notes (Signed)
Pt readjusted in bed. ED PA at bedside .

## 2022-12-02 NOTE — Discharge Instructions (Addendum)
Your sodium level today was 130.  Although this is still low, it this is improved from previous.  Your MRI today was reassuring.  COVID test was negative.  I recommend that you use your walker to help with your balance issues.  Continue taking your salt tablets as directed.  Please follow-up with your primary care provider for recheck.

## 2022-12-02 NOTE — ED Notes (Signed)
Pt given crackers and water per request 

## 2022-12-03 NOTE — ED Provider Notes (Signed)
[facility] [facility] AT [facility] Provider Note   CSN: [id number] Arrival date & time: 12/02/22  1607     History  Chief Complaint  Patient presents with   Blurred Vision    Heidi Dorsey is a 89 y.o. female.  HPI      Heidi Dorsey is a 89 y.o. female with past medical history of hypertension, GERD, Raynaud's disease, CKD, asthma, and chronic problems with her balance who presents to the Emergency Department from home by EMS with complaint of blurred vision and feeling off balance when she stands or walks.  Patient uses walker or cane at baseline.  EMS reports that patient was walking around in her home with a cane on arrival.  She reports feeling somewhat dizzy and has a haze or fog of her vision bilaterally.  Denies headache, chest pain, abdominal pain, nausea or vomiting or unilateral weakness.  States she was here last month with similar symptoms and found to be hyponatremic and since then has been taking salt tablets and drinking plenty of water.  She is concerned that her sodium may be low again  Home Medications Prior to Admission medications   Medication Sig Start Date End Date Taking? Authorizing Provider  acetaminophen (TYLENOL) 325 MG tablet Take 2 tablets (650 mg total) by mouth every 6 (six) hours as needed for mild pain (or Fever >/= 101). Patient not taking: Reported on 11/22/2022 11/07/22   Shon Hale, MD  amiodarone (PACERONE) 100 MG tablet Take 1 tablet (100 mg total) by mouth daily. 11/08/22   Shon Hale, MD  ASCORBIC ACID PO Take 1 tablet by mouth daily. OTC vitamin C    [provider]  BIOTIN PO Take 1 tablet by mouth daily.    [provider]  Cholecalciferol (VITAMIN D-3 PO) Take 1 capsule by mouth daily.    [provider]  losartan (COZAAR) 50 MG tablet Take 1 tablet (50 mg total) by mouth daily. 11/07/22 12/07/22  Shon Hale, MD  Multiple Vitamins-Minerals (CENTRUM SILVER  50+WOMEN) TABS Take 1 tablet by mouth daily.    [provider]  Omega-3 Fatty Acids (FISH OIL PO) Take 1 capsule by mouth daily.    [provider]  sodium chloride 1 g tablet Take 1 tablet (1 g total) by mouth 3 (three) times a week. 11/24/22   Sonny Masters, FNP      Allergies    Allegra [fexofenadine], Estrace [estradiol], Miacalcin [calcitonin], Nsaids, Benadryl [diphenhydramine], Atrovent hfa [ipratropium bromide hfa], Paraphenylenediamine, Augmentin [amoxicillin-pot clavulanate], Biaxin [clarithromycin], Cipro [ciprofloxacin hcl], Codeine, Epipen [epinephrine], Erythromycin, Flonase [fluticasone], Latex, Macrobid [nitrofurantoin], Other, Silenor [doxepin hcl], Sulfa antibiotics, Sumycin [tetracycline], and Zestril [lisinopril]    Review of Systems   Review of Systems  Constitutional:  Negative for appetite change, chills and fever.  HENT:  Negative for congestion.   Respiratory:  Negative for shortness of breath.   Cardiovascular:  Negative for chest pain.  Gastrointestinal:  Negative for abdominal pain, nausea and vomiting.  Genitourinary:  Negative for difficulty urinating.  Musculoskeletal:  Negative for arthralgias and myalgias.  Neurological:  Positive for dizziness and weakness. Negative for tremors, syncope, facial asymmetry, speech difficulty, light-headedness, numbness and headaches.    Physical Exam Updated Vital Signs BP (!) 147/75 (BP Location: Right Arm)   Pulse 61   Temp 98.2 F (36.8 C)   Resp 17   Ht 5' (1.524 m)   Wt 60.3 kg   SpO2 98%   BMI  25.97 kg/m  Physical Exam Vitals and nursing note reviewed.  Constitutional:      General: She is not in acute distress.    Appearance: Normal appearance. She is not ill-appearing or toxic-appearing.  HENT:     Head: Atraumatic.  Eyes:     Extraocular Movements: Extraocular movements intact.     Conjunctiva/sclera: Conjunctivae normal.     Pupils: Pupils are equal, round, and reactive to light.   Cardiovascular:     Rate and Rhythm: Normal rate. Rhythm irregular.     Pulses: Normal pulses.     Heart sounds: No murmur heard. Pulmonary:     Effort: Pulmonary effort is normal.     Breath sounds: Normal breath sounds.  Abdominal:     Palpations: Abdomen is soft.     Tenderness: There is no abdominal tenderness.  Musculoskeletal:        General: Normal range of motion.     Cervical back: Normal range of motion. No rigidity.     Right lower leg: No edema.     Left lower leg: No edema.  Lymphadenopathy:     Cervical: No cervical adenopathy.  Skin:    General: Skin is warm.     Capillary Refill: Capillary refill takes less than 2 seconds.     Findings: No rash.  Neurological:     General: No focal deficit present.     Mental Status: She is alert.     GCS: GCS eye subscore is 4. GCS verbal subscore is 5. GCS motor subscore is 6.     Sensory: Sensation is intact. No sensory deficit.     Motor: Motor function is intact. No weakness or pronator drift.     Comments: CN II through XII intact.  Speech clear.  Patient is mentating well.  I do not appreciate any pronator drift, facial droop or lower extremity weakness     ED Results / Procedures / Treatments   Labs (all labs ordered are listed, but only abnormal results are displayed) Labs Reviewed  CBC WITH DIFFERENTIAL/PLATELET - Abnormal; Notable for the following components:      Result Value   Hemoglobin 11.9 (*)    HCT 35.7 (*)    All other components within normal limits  COMPREHENSIVE METABOLIC PANEL - Abnormal; Notable for the following components:   Sodium 130 (*)    Chloride 97 (*)    Calcium 8.8 (*)    Total Protein 6.1 (*)    All other components within normal limits  URINALYSIS, ROUTINE W REFLEX MICROSCOPIC - Abnormal; Notable for the following components:   Color, Urine STRAW (*)    All other components within normal limits  SARS CORONAVIRUS 2 BY RT PCR  MAGNESIUM    EKG EKG  Interpretation Date/Time:  Thursday December 02 2022 16:17:23 EDT Ventricular Rate:  50 PR Interval:  197 QRS Duration:  157 QT Interval:  521 QTC Calculation: 476 R Axis:   23  Text Interpretation: Sinus rhythm Left bundle branch block No significant change since last tracing Confirmed by Benjiman Core [phone]) on 12/02/2022 5:10:38 PM  Radiology MR BRAIN WO CONTRAST  Result Date: 12/02/2022 CLINICAL DATA:  Altered mental status EXAM: MRI HEAD WITHOUT CONTRAST TECHNIQUE: Multiplanar, multiecho pulse sequences of the brain and surrounding structures were obtained without intravenous contrast. COMPARISON:  No prior MRI available, correlation is made with 11/04/2022 CT head FINDINGS: The patient declined to continue with postcontrast images. Brain: No restricted diffusion to suggest acute or subacute  infarct. No acute hemorrhage, mass, mass effect, or midline shift. No hydrocephalus or extra-axial collection. Normal craniocervical junction. No hemosiderin deposition to suggest remote hemorrhage. Normal cerebral volume for age. Vascular: Normal arterial flow voids. Skull and upper cervical spine: Normal marrow signal. Degenerative changes in the cervical spine, with 3 mm anterolisthesis of C2 on C3 and 2 mm anterolisthesis of C3 on C4 Sinuses/Orbits: Clear paranasal sinuses. No acute finding in the orbits. Other: Trace fluid in the left mastoid air cells. IMPRESSION: 1. No acute intracranial process. No evidence of acute or subacute infarct. 2. Degenerative changes in the cervical spine, with 3 mm anterolisthesis of C2 on C3 and 2 mm anterolisthesis of C3 on C4. Electronically Signed   By: Wiliam Ke M.D.   On: 12/02/2022 19:02   DG Chest Portable 1 View  Result Date: 12/02/2022 CLINICAL DATA:  Cough. EXAM: PORTABLE CHEST 1 VIEW COMPARISON:  November 04, 2022. FINDINGS: The heart size and mediastinal contours are within normal limits. Both lungs are clear. Severe degenerative changes seen involving the  left glenohumeral joint. IMPRESSION: No active disease. Electronically Signed   By: Lupita Raider M.D.   On: 12/02/2022 17:47    Procedures Procedures    Medications Ordered in ED Medications  sodium chloride 0.9 % bolus 500 mL (0 mLs Intravenous Stopped 12/02/22 2051)    ED Course/ Medical Decision Making/ A&P                                 Medical Decision Making Patient here from home with concerns of issues with her balance.  On review of her medical record patient has history of same, she ambulates with walker and/or cane at baseline.  Describes some dizziness and a fall or haze of her bilateral vision.  Denies any pain or unilateral weakness.  Recently hospitalized for hyponatremia and she is concerned her sodium is low again  Electrolyte abnormality, infection, CVA versus TIA considered patient non toxic appearing.  No concerning symptoms for sepsis  Amount and/or Complexity of Data Reviewed Labs: ordered.    Details: Labs interpreted by me, no evidence of leukocytosis, hemoglobin near baseline magnesium unremarkable, urine without evidence of infection, COVID test negative her chemistries show hyponatremia with sodium of 130 this is similar to her baseline and improved from previous hospital admission Radiology: ordered.    Details: Chest x-ray without acute cardiopulmonary process.  MRI brain ordered for further evaluation.  No acute intracranial process no evidence of acute or subacute infarct ECG/medicine tests: ordered.    Details: EKG shows sinus rhythm with left bundle branch block no significant change from last tracing Discussion of management or test interpretation with external provider(s): Patient here with probable developing dementia.  No MRI evidence of acute neurologic process.  I do not appreciate any focal neurodeficits on exam.  She is hyponatremic but appears near baseline and has improved.  IV fluids given here she is currently drinking water and taking salt  tablets at home.  No workup findings to suggest an infectious process.  I feel patient is appropriate for discharge back home.  I have spoken with her grandson who is POA.  He confirms dementia and is in process of trying to establish placement in a assisted living or nursing home facility.           Final Clinical Impression(s) / ED Diagnoses Final diagnoses:  Dizziness  Hyponatremia  Rx / DC Orders ED Discharge Orders     None         Rosey Bath 12/03/22 1528    Benjiman Core, MD 12/03/22 2326

## 2022-12-10 ENCOUNTER — Ambulatory Visit: Payer: Medicare Other | Admitting: Family Medicine

## 2022-12-10 ENCOUNTER — Encounter: Payer: Self-pay | Admitting: Family Medicine

## 2022-12-10 ENCOUNTER — Ambulatory Visit (INDEPENDENT_AMBULATORY_CARE_PROVIDER_SITE_OTHER): Payer: Medicare Other | Admitting: Family Medicine

## 2022-12-10 VITALS — BP 163/62 | HR 54 | Temp 97.2°F | Ht 61.0 in | Wt 130.0 lb

## 2022-12-10 DIAGNOSIS — N1831 Chronic kidney disease, stage 3a: Secondary | ICD-10-CM | POA: Diagnosis not present

## 2022-12-10 DIAGNOSIS — E871 Hypo-osmolality and hyponatremia: Secondary | ICD-10-CM | POA: Diagnosis not present

## 2022-12-10 DIAGNOSIS — I10 Essential (primary) hypertension: Secondary | ICD-10-CM | POA: Diagnosis not present

## 2022-12-10 DIAGNOSIS — R42 Dizziness and giddiness: Secondary | ICD-10-CM | POA: Diagnosis not present

## 2022-12-10 DIAGNOSIS — R413 Other amnesia: Secondary | ICD-10-CM

## 2022-12-10 NOTE — Patient Instructions (Signed)
No alcohol  

## 2022-12-10 NOTE — Progress Notes (Signed)
Subjective:  Patient ID: Heidi Dorsey, female    DOB: 09/14/1930, 89 y.o.   MRN: [id number]  Patient Care Team: Sonny Masters, FNP as PCP - General (Family Medicine) Rollene Rotunda, MD as PCP - Cardiology (Cardiology) Glendale Chard, DO as Consulting Physician (Neurology) Clinton Gallant, RN as [facility] Care Management [place], Fanny Dance, LCSW as [facility] Care Management (Licensed Clinical Social Worker)   Chief Complaint:  ER follow up  (12/02/2022 (4 hours)/[place] [facility] at [facility]- Dizziness )   HPI: Heidi Dorsey is a 89 y.o. female presenting on 12/10/2022 for ER follow up  (12/02/2022 (4 hours)/[place] [facility] at [facility]- Dizziness )   Pt presents today for ED follow up. She was seen at [facility] on 12/02/2022 for blurred vision and dizziness. She has a history of hyponatremia and is prescribed sodium tablets. Sodium during ED visit was 130. She states she continues to have the unsteadiness. Daughter is concerned with memory changes and dementia. Reports pt is argumentative and forgetful. She has to put her medications out for her so she will take them as prescribed. She has to be called and reminded to take her medications. She completed MMSE today and scored 29/30.     Relevant past medical, surgical, family, and social history reviewed and updated as indicated.  Allergies and medications reviewed and updated. Data reviewed: Chart in Epic.   Past Medical History:  Diagnosis Date   Asthma    BPPV (benign paroxysmal positional vertigo)    Claustrophobia    Diverticulosis    GERD (gastroesophageal reflux disease)    H/O degenerative disc disease    Hiatal hernia    Hyperlipidemia    Hypertension    Kidney stones    Migraines    Osteoarthritis    Caused feet deformity   Osteopenia    Prediabetes    Preglaucoma    Raynauds syndrome    Rectocele    Rosacea     Sciatica    Sleep apnea    Spinal stenosis    Vertebrobasilar artery syndrome     Past Surgical History:  Procedure Laterality Date   ABDOMINAL HYSTERECTOMY  1978   ANTERIOR (CYSTOCELE) AND POSTERIOR REPAIR (RECTOCELE) WITH XENFORM GRAFT AND SACROSPINOUS FIXATION     BILATERAL OOPHORECTOMY  1997   bladder stones     kidney colic/ procedure performed in 1973 at [facility]   BLADDER SUSPENSION     BREAST BIOPSY Left 12/1987   neg   BREAST BIOPSY Right 02/1977   neg papilloma   CARDIAC CATHETERIZATION  2000   HERNIA REPAIR     plantar fibroma  Left 09/1997   Excised    Social History   Socioeconomic History   Marital status: Single    Spouse name: Not on file   Number of children: Not on file   Years of education: Not on file   Highest education level: Not on file  Occupational History   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    Drinks [employer] Coffee and Tea   Originally from [place], then lived in [place] most of her life - moved here 09/2019 - feels out of place, having trouble finding others with similar interests   She is agnostic - not religious (although raised Catholic) - strong scientific beliefs    Social Determinants of Health   Financial Resource Strain: Low Risk  (12/06/2022)   Overall Financial Resource Strain (CARDIA)    Difficulty of Paying Living Expenses: Not hard at all  Food Insecurity: No Food Insecurity (12/06/2022)    Hunger Vital Sign    Worried About Running Out of Food in the Last Year: Never true    Ran Out of Food in the Last Year: Never true  Transportation Needs: No Transportation Needs (12/06/2022)   PRAPARE - Administrator, Civil Service (Medical): No    Lack of Transportation (Non-Medical): No  Physical Activity: Inactive (12/06/2022)   Exercise Vital Sign    Days of Exercise per Week: 0 days    Minutes of Exercise per Session: 30 min  Stress: Patient Declined (12/06/2022)   [employer] of Occupational Health - Occupational Stress Questionnaire    Feeling of Stress : Patient declined  Social Connections: Socially Isolated (12/06/2022)   Social Connection and Isolation Panel [NHANES]    Frequency of Communication with Friends and Family: More than three times a week    Frequency of Social Gatherings with Friends and Family: More than three times a week    Attends Religious Services: Never    Database administrator or Organizations: No    Attends Banker Meetings: Never    Marital Status: Divorced  Catering manager Violence: Not At Risk (11/05/2022)   Humiliation, Afraid, Rape, and Kick questionnaire    Fear of Current or Ex-Partner: No    Emotionally Abused: No    Physically Abused: No    Sexually Abused: No    Outpatient Encounter Medications as of 12/10/2022  Medication Sig   acetaminophen (TYLENOL) 325 MG tablet Take 2 tablets (650 mg total) by mouth every 6 (six) hours as needed for mild pain (or Fever >/= 101).   amiodarone (PACERONE) 100 MG tablet Take 1 tablet (100 mg total) by mouth daily.   ASCORBIC ACID PO Take 1 tablet by mouth daily. OTC vitamin C   BIOTIN PO Take 1 tablet by mouth daily.   Cholecalciferol (VITAMIN D-3 PO) Take 1 capsule by mouth daily.   cyanocobalamin (VITAMIN B12) 1000 MCG tablet Take 1,000 mcg by mouth daily.   Multiple Vitamins-Minerals (CENTRUM SILVER 50+WOMEN) TABS Take 1 tablet by mouth daily.   Omega-3 Fatty Acids (FISH  OIL PO) Take 1 capsule by mouth daily.   sodium chloride 1 g tablet Take 1 tablet (1 g total) by mouth 3 (three) times a week.   losartan (COZAAR) 50 MG tablet Take 1 tablet (50 mg total) by mouth daily.   No facility-administered encounter medications on file as of 12/10/2022.    Allergies  Allergen Reactions   Allegra [Fexofenadine] Other (See Comments)    Syncope   Estrace [Estradiol] Other (See Comments)    Headache   Miacalcin [Calcitonin] Palpitations    Left bundle branch block   Nsaids  Other (See Comments)    Stomach ulcer after taking for 3 days in 2009. Negative H. Pylori test.   Benadryl [Diphenhydramine] Other (See Comments)    Syncope   Atrovent Hfa [Ipratropium Bromide Hfa] Other (See Comments)    Syncope    Paraphenylenediamine Other (See Comments)    Unknown reaction   Augmentin [Amoxicillin-Pot Clavulanate] Rash    OK to take plain amoxicillin   Biaxin [Clarithromycin] Rash    OK to take azithromycin   Cipro [Ciprofloxacin Hcl] Rash   Codeine Other (See Comments)    Unknown reaction   Epipen [Epinephrine] Other (See Comments)    Unknown reaction   Erythromycin Other (See Comments)    Stomach irritation   Flonase [Fluticasone] Other (See Comments)    Unknown reaction Similar reactions to corticoid steroids   Latex Rash   Macrobid [Nitrofurantoin] Other (See Comments)    Multiple mild symptoms - would prefer not to take again   Other Other (See Comments)    Propanol amine Anti-cholinergic compounds   Silenor [Doxepin Hcl] Other (See Comments)    Heart arrhythmia   Sulfa Antibiotics Other (See Comments)    Unknown reaction   Sumycin [Tetracycline] Other (See Comments)    Mouth sores OK to take doxycycline   Zestril [Lisinopril] Other (See Comments)    Bruising Cankers Arthralgias    Review of Systems  Constitutional:  Negative for activity change, appetite change, chills, diaphoresis, fatigue, fever and unexpected weight change.  Eyes:  Negative  for photophobia and visual disturbance.  Respiratory:  Negative for cough and shortness of breath.   Cardiovascular:  Negative for chest pain, palpitations and leg swelling.  Gastrointestinal:  Negative for abdominal pain.  Endocrine: Negative for polydipsia, polyphagia and polyuria.  Genitourinary:  Negative for decreased urine volume and difficulty urinating.  Neurological:  Positive for dizziness, weakness and numbness (left hand at times). Negative for tremors, seizures, syncope, facial asymmetry, speech difficulty, light-headedness and headaches.  All other systems reviewed and are negative.       Objective:  BP (!) 163/62   Pulse (!) 54   Temp (!) 97.2 F (36.2 C) (Temporal)   Ht 5\' 1"  (1.549 m)   Wt 130 lb (59 kg)   SpO2 98%   BMI 24.56 kg/m    Wt Readings from Last 3 Encounters:  12/10/22 130 lb (59 kg)  12/02/22 133 lb (60.3 kg)  11/22/22 130 lb 3.2 oz (59.1 kg)    Physical Exam Vitals and nursing note reviewed.  Constitutional:      General: She is not in acute distress.    Appearance: Normal appearance. She is not ill-appearing, toxic-appearing or diaphoretic.  HENT:     Head: Normocephalic and atraumatic.     Nose: Nose normal.     Mouth/Throat:     Mouth: Mucous membranes are moist.     Pharynx: Oropharynx is clear.  Eyes:     Pupils: Pupils are equal, round, and reactive to light.  Cardiovascular:     Rate and Rhythm: Normal rate. Rhythm irregularly irregular.  Pulmonary:     Effort: Pulmonary effort is normal.     Breath sounds: Normal breath sounds.  Skin:    General: Skin is warm and dry.     Capillary Refill: Capillary refill takes less than 2 seconds.  Neurological:     General: No focal deficit present.     Mental Status: She is alert and oriented to person, place, and time.  Gait: Gait abnormal (using cane).  Psychiatric:        Attention and Perception: Attention normal.        Mood and Affect: Affect is angry.        Behavior:  Behavior is agitated.     Results for orders placed or performed during the hospital encounter of 12/02/22  SARS Coronavirus 2 by RT PCR (hospital order, performed in [facility] hospital lab) *cepheid single result test* Urine, Clean Catch   Specimen: Urine, Clean Catch; Nasal Swab  Result Value Ref Range   SARS Coronavirus 2 by RT PCR NEGATIVE NEGATIVE  CBC with Differential  Result Value Ref Range   WBC 5.7 4.0 - 10.5 K/uL   RBC 3.91 3.87 - 5.11 MIL/uL   Hemoglobin 11.9 (L) 12.0 - 15.0 g/dL   HCT 24.4 (L) 01.0 - 27.2 %   MCV 91.3 80.0 - 100.0 fL   MCH 30.4 26.0 - 34.0 pg   MCHC 33.3 30.0 - 36.0 g/dL   RDW 53.6 64.4 - 03.4 %   Platelets 208 150 - 400 K/uL   nRBC 0.0 0.0 - 0.2 %   Neutrophils Relative % 70 %   Neutro Abs 4.0 1.7 - 7.7 K/uL   Lymphocytes Relative 16 %   Lymphs Abs 0.9 0.7 - 4.0 K/uL   Monocytes Relative 11 %   Monocytes Absolute 0.6 0.1 - 1.0 K/uL   Eosinophils Relative 1 %   Eosinophils Absolute 0.1 0.0 - 0.5 K/uL   Basophils Relative 1 %   Basophils Absolute 0.1 0.0 - 0.1 K/uL   Immature Granulocytes 1 %   Abs Immature Granulocytes 0.04 0.00 - 0.07 K/uL  Comprehensive metabolic panel  Result Value Ref Range   Sodium 130 (L) 135 - 145 mmol/L   Potassium 4.4 3.5 - 5.1 mmol/L   Chloride 97 (L) 98 - 111 mmol/L   CO2 26 22 - 32 mmol/L   Glucose, Bld 97 70 - 99 mg/dL   BUN 19 8 - 23 mg/dL   Creatinine, Ser 7.42 0.44 - 1.00 mg/dL   Calcium 8.8 (L) 8.9 - 10.3 mg/dL   Total Protein 6.1 (L) 6.5 - 8.1 g/dL   Albumin 3.7 3.5 - 5.0 g/dL   AST 17 15 - 41 U/L   ALT 18 0 - 44 U/L   Alkaline Phosphatase 59 38 - 126 U/L   Total Bilirubin 0.5 0.3 - 1.2 mg/dL   GFR, Estimated >59 >56 mL/min   Anion gap 7 5 - 15  Magnesium  Result Value Ref Range   Magnesium 2.1 1.7 - 2.4 mg/dL  Urinalysis, Routine w reflex microscopic -Urine, Clean Catch  Result Value Ref Range   Color, Urine STRAW (A) YELLOW   APPearance CLEAR CLEAR   Specific Gravity, Urine 1.006 1.005 -  1.030   pH 6.0 5.0 - 8.0   Glucose, UA NEGATIVE NEGATIVE mg/dL   Hgb urine dipstick NEGATIVE NEGATIVE   Bilirubin Urine NEGATIVE NEGATIVE   Ketones, ur NEGATIVE NEGATIVE mg/dL   Protein, ur NEGATIVE NEGATIVE mg/dL   Nitrite NEGATIVE NEGATIVE   Leukocytes,Ua NEGATIVE NEGATIVE       Pertinent labs & imaging results that were available during my care of the patient were reviewed by me and considered in my medical decision making.  Assessment & Plan:  Averianna was seen today for er follow up .  Diagnoses and all orders for this visit:  Hyponatremia Will repeat labs today -     CMP14+EGFR -  Ambulatory referral to Neurology  Dizziness Memory changes  Will repeat labs today. Referral to neurology for ongoing dizziness and memory changes.  -     CMP14+EGFR -     Anemia Profile B -     Thyroid Panel With TSH -     Ambulatory referral to Neurology  Essential hypertension Elevated in office today but pt is angry and argumentative during visit. States normal at home.  -     CMP14+EGFR -     Anemia Profile B -     Thyroid Panel With TSH  Stage 3a chronic kidney disease (HCC) Will repeat labs today. Will refer to nephrology if warranted.  -     CMP14+EGFR -     Anemia Profile B   FL2 form completed today    Continue all other maintenance medications.  Follow up plan: Return if symptoms worsen or fail to improve.   Continue healthy lifestyle choices, including diet (rich in fruits, vegetables, and lean proteins, and low in salt and simple carbohydrates) and exercise (at least 30 minutes of moderate physical activity daily).  Educational handout given for hyponatremia   The above assessment and management plan was discussed with the patient. The patient verbalized understanding of and has agreed to the management plan. Patient is aware to call the clinic if they develop any new symptoms or if symptoms persist or worsen. Patient is aware when to return to the clinic for a  follow-up visit. Patient educated on when it is appropriate to go to the emergency department.   Kari Baars, FNP-C [facility] [phone]

## 2022-12-11 LAB — CMP14+EGFR
ALT: 15 IU/L (ref 0–32)
AST: 19 IU/L (ref 0–40)
Albumin: 4.2 g/dL (ref 3.7–4.7)
Alkaline Phosphatase: 71 IU/L (ref 44–121)
BUN/Creatinine Ratio: 22 (ref 12–28)
BUN: 17 mg/dL (ref 8–27)
Bilirubin Total: 0.5 mg/dL (ref 0.0–1.2)
CO2: 21 mmol/L (ref 20–29)
Calcium: 9.4 mg/dL (ref 8.7–10.3)
Chloride: 94 mmol/L — ABNORMAL LOW (ref 96–106)
Creatinine, Ser: 0.77 mg/dL (ref 0.57–1.00)
Globulin, Total: 2 g/dL (ref 1.5–4.5)
Glucose: 96 mg/dL (ref 70–99)
Potassium: 4.7 mmol/L (ref 3.5–5.2)
Sodium: 129 mmol/L — ABNORMAL LOW (ref 134–144)
Total Protein: 6.2 g/dL (ref 6.0–8.5)
eGFR: 74 mL/min/{1.73_m2} (ref >59)

## 2022-12-11 LAB — ANEMIA PROFILE B
Basophils Absolute: 0 10*3/uL (ref 0.0–0.2)
Basos: 1 %
EOS (ABSOLUTE): 0.1 10*3/uL (ref 0.0–0.4)
Eos: 1 %
Ferritin: 122 ng/mL (ref 15–150)
Folate: >20 ng/mL (ref >3.0)
Hematocrit: 35.3 % (ref 34.0–46.6)
Hemoglobin: 11.8 g/dL (ref 11.1–15.9)
Immature Grans (Abs): 0 10*3/uL (ref 0.0–0.1)
Immature Granulocytes: 0 %
Iron Saturation: 39 % (ref 15–55)
Iron: 123 ug/dL (ref 27–139)
Lymphocytes Absolute: 0.7 10*3/uL (ref 0.7–3.1)
Lymphs: 12 %
MCH: 30 pg (ref 26.6–33.0)
MCHC: 33.4 g/dL (ref 31.5–35.7)
MCV: 90 fL (ref 79–97)
Monocytes Absolute: 0.6 10*3/uL (ref 0.1–0.9)
Monocytes: 11 %
Neutrophils Absolute: 4.2 10*3/uL (ref 1.4–7.0)
Neutrophils: 75 %
Platelets: 191 10*3/uL (ref 150–450)
RBC: 3.93 x10E6/uL (ref 3.77–5.28)
RDW: 14 % (ref 11.7–15.4)
Retic Ct Pct: 1.5 % (ref 0.6–2.6)
Total Iron Binding Capacity: 313 ug/dL (ref 250–450)
UIBC: 190 ug/dL (ref 118–369)
Vitamin B-12: 1076 pg/mL (ref 232–1245)
WBC: 5.6 10*3/uL (ref 3.4–10.8)

## 2022-12-11 LAB — THYROID PANEL WITH TSH
Free Thyroxine Index: 2.9 (ref 1.2–4.9)
T3 Uptake Ratio: 32 % (ref 24–39)
T4, Total: 9.1 ug/dL (ref 4.5–12.0)
TSH: 3.45 u[IU]/mL (ref 0.450–4.500)

## 2022-12-14 ENCOUNTER — Encounter: Payer: Self-pay | Admitting: Family Medicine

## 2022-12-14 ENCOUNTER — Telehealth: Payer: Self-pay

## 2022-12-14 NOTE — Telephone Encounter (Signed)
Transition Care Management Follow-up Telephone Call Date of discharge and from where: 12/02/2022 [facility] How have you been since you were released from the hospital? Patient's grand-son/POA stated she is feeling better. Any questions or concerns? No  Items Reviewed: Did the pt receive and understand the discharge instructions provided? Yes  Medications obtained and verified?  No medication prescribed. Other? No  Any new allergies since your discharge? No  Dietary orders reviewed? Yes Do you have support at home? Yes   Follow up appointments reviewed:  PCP Hospital f/u appt confirmed? Yes  Scheduled to see Doralee Albino. Rakes, FNP on 12/10/2022 @ [facility]. Specialist Hospital f/u appt confirmed? No  Scheduled to see  on  @ . Are transportation arrangements needed? No  If their condition worsens, is the pt aware to call PCP or go to the Emergency Dept.? Yes Was the patient provided with contact information for the PCP's office or ED? Yes Was to pt encouraged to call back with questions or concerns? Yes  Atalia Litzinger [facility] Population Health Community Resource Care Guide   [email or web address]  ?? [phone]   Website: [email or web address]  [email or web address]

## 2022-12-15 ENCOUNTER — Telehealth: Payer: Self-pay | Admitting: Family Medicine

## 2022-12-15 DIAGNOSIS — E871 Hypo-osmolality and hyponatremia: Secondary | ICD-10-CM

## 2022-12-15 MED ORDER — SODIUM CHLORIDE 1 G PO TABS
ORAL_TABLET | ORAL | 0 refills | Status: DC
Start: 2022-12-15 — End: 2023-01-27

## 2022-12-15 NOTE — Telephone Encounter (Signed)
Rx sent with direction change. Further fills and monitoring per PCP. Anticipate she will want a repeat BMP sometime next week.

## 2022-12-15 NOTE — Telephone Encounter (Signed)
Reviewed results with pts daughter per providers notes. Daughter will get her started on taking sodium 5x per week. Wants to know if PCP is going to send refill in for pt?

## 2022-12-16 ENCOUNTER — Telehealth: Payer: Self-pay | Admitting: Family Medicine

## 2022-12-16 NOTE — Telephone Encounter (Signed)
When should she repeat her sodium since she is changing the dose?   Will send to PCP coverage.

## 2022-12-16 NOTE — Telephone Encounter (Signed)
Left vm for cb

## 2022-12-16 NOTE — Telephone Encounter (Signed)
One week after the change

## 2022-12-17 ENCOUNTER — Ambulatory Visit: Payer: Self-pay | Admitting: *Deleted

## 2022-12-19 ENCOUNTER — Encounter: Payer: Self-pay | Admitting: *Deleted

## 2022-12-19 NOTE — Patient Outreach (Signed)
Care Coordination   Initial Visit Note   12/19/2022  Name: Heidi Dorsey MRN: [id number] DOB: Jun 21, 1930  Heidi Dorsey is a 89 y.o. year old female who sees Rakes, Doralee Albino, FNP for primary care. I spoke with patient's grandson/healthcare power of attorney, Chauncy Passy by phone today.  What matters to the patients health and wellness today?  Assist with Pursuing Higher Level of Care Options.    Goals Addressed             This Visit's Progress    Assist with Pursuing Higher Level of Care Options.   On track    Care Coordination Interventions:  Interventions Today    Flowsheet Row Most Recent Value  Chronic Disease   Chronic disease during today's visit Hypertension (HTN), Atrial Fibrillation (AFib), Other, Chronic Kidney Disease/End Stage Renal Disease (ESRD)  [Raynaud Disease, Pre-Diabetes, Multiple Chemical Sensitivity Syndrome, Cognitive Changes, Gait Abnormality, Inability to Perform Activities of Daily Living Independently.]  General Interventions   General Interventions Discussed/Reviewed General Interventions Discussed, Labs, Vaccines, Doctor Visits, Referral to Nurse, Communication with, Level of Care, [employer], Health Screening, Annual Foot Exam, Lipid Profile, General Interventions Reviewed, Durable Medical Equipment (DME), Annual Eye Exam  [Encouraged]  Labs Hgb A1c every 3 months, Kidney Function  [Encouraged]  Vaccines COVID-19, Tetanus/Pertussis/Diphtheria, Shingles, RSV, Pneumonia, Flu  [Encouraged]  Doctor Visits Discussed/Reviewed Doctor Visits Discussed, Doctor Visits Reviewed, Annual Wellness Visits, PCP, Specialist  [Encouraged]  Health Screening Bone Density, Colonoscopy, Mammogram  [Encouraged]  Durable Medical Equipment (DME) BP Cuff, Walker, Wheelchair, Other  [Encouraged]  Wheelchair Standard  [Encouraged]  PCP/Specialist Visits Compliance with follow-up visit, Contact provider for referral to  [employer] provider  for referral to PCP, Specialist, Dentist, Ophthalmologist  [Encouraged]  Communication with PCP/Specialists, RN, Pharmacists, Social Work  [Encouraged]  Level of Care Adult Daycare, Development worker, international aid, Air traffic controller, Assisted Living, Skilled Nursing Facility  [Encouraged]  Applications Medicaid, Personal Care Services, FL-2  [Encouraged]  Exercise Interventions   Exercise Discussed/Reviewed Exercise Discussed, Assistive device use and maintanence, Exercise Reviewed, Physical Activity, Weight Managment  [Encouraged]  Physical Activity Discussed/Reviewed Physical Activity Discussed, Home Exercise Program (HEP), Physical Activity Reviewed, PREP, Gym, Types of exercise  [Encouraged]  Weight Management Weight maintenance  [Encouraged]  Education Interventions   Education Provided Provided Therapist, sports, Provided Web-based Education, Provided Education  [Encouraged]  Provided Verbal Education On Nutrition, Mental Health/Coping with Illness, Foot Care, Eye Care, Applications, Exercise, Blood Sugar Monitoring, Labs, Medication, Development worker, community, [employer], When to see the doctor  [Encouraged]  Labs Reviewed --  [N/A]  Applications Medicaid, Personal Care Services, FL-2  [Encouraged]  Mental Health Interventions   Mental Health Discussed/Reviewed Mental Health Discussed, Mental Health Reviewed, Coping Strategies, Crisis, Anxiety, Depression, Grief and Loss, Substance Abuse, Suicide, Other  [Domestic Violence]  Nutrition Interventions   Nutrition Discussed/Reviewed Nutrition Discussed, Adding fruits and vegetables, Increasing proteins, Decreasing fats, Decreasing salt, Supplemental nutrition, Decreasing sugar intake, Fluid intake, Nutrition Reviewed, Carbohydrate meal planning, Portion sizes  [Encouraged]  Pharmacy Interventions   Pharmacy Dicussed/Reviewed Pharmacy Topics Discussed, Medications and their functions, Medication Adherence, Pharmacy Topics Reviewed, Affording Medications   [Encouraged]  Medication Adherence --  [N/A]  Safety Interventions   Safety Discussed/Reviewed Safety Discussed, Safety Reviewed, Fall Risk, Home Safety  [Encouraged]  Home Safety Assistive Devices, Need for home safety assessment, Refer for home visit, Refer for community resources  [Encouraged]  Advanced Directive Interventions   Advanced Directives Discussed/Reviewed Advanced Directives Discussed, Advanced Directives Reviewed  [Completed]  Assessed Social Determinant of Health Barriers. Discussed Plans for Ongoing Care Management Follow Up. Provided Careers information officer Information for Care Management Team Members. Screened for Signs & Symptoms of Depression, Related to Chronic Disease State.  PHQ2 & PHQ9 Depression Screen Completed & Results Reviewed.  Suicidal Ideation & Homicidal Ideation Assessed - None Present.   Domestic Violence Assessed - None Present. Access to Weapons Assessed - None Present.   Active Listening & Reflection Utilized.  Verbalization of Feelings Encouraged.  Emotional Support Provided. Feelings of Caregiver Burnout & Fatigue Validated. Caregiver Stress Acknowledged. Caregiver Resources Reviewed. Caregiver Support Groups Mailed. Self-Enrollment in Caregiver Support Group of Interest Emphasized. Crisis Support Information, Agencies, Services, & Resources Discussed. Problem Solving Interventions Identified. Task-Centered Solutions Implemented.   Solution-Focused Strategies Developed. Acceptance & Commitment Therapy Introduced. Brief Cognitive Behavioral Therapy Initiated. Client-Centered Therapy Enacted. Reviewed Prescription Medications & Discussed Importance of Compliance. Encouraged Administration of Medications, Exactly as Prescribed. Quality of Sleep Assessed & Sleep Hygiene Techniques Promoted. Encouraged Increased Level of Activity & Exercise, as Tolerated. CSW Collaboration with Grandson/Healthcare Power of Attorney, Chauncy Passy to Discuss Higher  Level of Care Placement Options for Patient (I.e. Memory Care Assisted Living, Extended Care, Skilled Nursing, Intermediate Level Care, Etc.) & Encouraged Consideration. CSW Collaboration with Grandson/Healthcare Power of Gerrit Friends, Chauncy Passy to [employer] No In-Home Care Services, [employer], Warden/ranger, Catering manager., Covered Under Chief Strategy Officer through [employer] & [employer].   CSW Collaboration with Grandson/Healthcare Power of Attorney, Chauncy Passy to Encourage Completion of Application for Special Assistance Long-Term Care Medicaid & Submission to [facility] of Social Services [phone]), for Processing.  CSW Collaboration with Grandson/Healthcare Power of Attorney, Chauncy Passy to Confirm Neither Patient, Nor Deceased Husband Were Veterans, Making Her Ineligible to Apply for Aid & Attendance Benefits, through [facility] [phone]). CSW Collaboration with Grandson/Healthcare Power of Gerrit Friends, Chauncy Passy to Request Review of Long-Term Care Facilities in [place], Emailed on 12/17/2022, & Encouraged Tour of Facilities of Interest, at [employer]. CSW Collaboration with Grandson/Healthcare Power of Gerrit Friends, Chauncy Passy to [employer] with CSW [phone]# [phone]), if He Has Questions, Needs Assistance, or If Additional Social Work Needs Are Identified Between Now & Our Next Follow-Up Outreach Call, Scheduled on 01/05/2023 at 10:30 AM. CSW Collaboration with Grandson/Healthcare Power of Attorney, Chauncy Passy to Encourage Attendance at Follow-Up Appointment for Patient with Gilford Silvius, Family Nurse Practitioner with [facility] [phone]), Scheduled on 01/21/2023 at 11:50 AM.  CSW Collaboration with Grandson/Healthcare Power of Attorney, Chauncy Passy to Encourage Attendance at Follow-Up Appointment for  Patient with Dr. Levert Feinstein, Neurologist with [facility] [phone]), Scheduled on 02/17/2023 at 10:00 AM. CSW Collaboration with Grandson/Healthcare Power of Attorney, Chauncy Passy to Encourage Attendance at Follow-Up Appointment for Patient with Dr. Rollene Rotunda, Cardiologist with [facility] [place] [phone]), Scheduled on 03/02/2023 at 1:00 PM. CSW Collaboration with Gilford Silvius, Family Nurse Practitioner with [facility] [phone]), Via Secure Chat Message in Epic, to Request Completion of FL-2 Form for Placement Purposes.        SDOH assessments and interventions completed:  Yes.  SDOH Interventions Today    Flowsheet Row Most Recent Value  SDOH Interventions   Food Insecurity Interventions Intervention Not Indicated  [Verified by Grandson/Healthcare Power of [place].]  Housing Interventions Intervention Not Indicated  [Verified by [employer] of [place],  Magdalen Spatz Stevens.]  Transportation Interventions Intervention Not Indicated, Patient Resources (Friends/Family), Payor Benefit  [Verified by Grandson/Healthcare Power of [place] Stevens.]  Utilities Interventions Intervention Not Indicated  [Verified by [employer] of [place].]  Alcohol Usage Interventions Intervention Not Indicated (Score <7)  [Verified by Grandson/Healthcare Power of [place] Stevens.]  Financial Strain Interventions Intervention Not Indicated  [Verified by [employer] of [place] Stevens.]  Physical Activity Interventions Patient Declined  [Verified by [employer] of [place].]  Stress Interventions Intervention Not Indicated  [Verified by [employer] of [place] Stevens.]  Social Connections Interventions Intervention Not Indicated  [Verified by Grandson/Healthcare  Power of [place] Stevens.]  Health Literacy Interventions Intervention Not Indicated  [Verified by Grandson/Healthcare Power of [place] Stevens.]     Care Coordination Interventions:  Yes, provided.   Follow up plan: Follow up call scheduled for 01/05/2023 at 10:30 am.  Encounter Outcome:  Patient Visit Completed.   Danford Bad, BSW, MSW, Printmaker Social Work Case Set designer Health  [facility], Population Health Direct Dial: [phone]  Fax: [phone] Email: [email or web address]

## 2022-12-19 NOTE — Patient Instructions (Signed)
Visit Information  Thank you for taking time to visit with me today. Please don't hesitate to contact me if I can be of assistance to you.   Following are the goals we discussed today:   Goals Addressed             This Visit's Progress    Assist with Pursuing Higher Level of Care Options.   On track    Care Coordination Interventions:  Interventions Today    Flowsheet Row Most Recent Value  Chronic Disease   Chronic disease during today's visit Hypertension (HTN), Atrial Fibrillation (AFib), Other, Chronic Kidney Disease/End Stage Renal Disease (ESRD)  [Raynaud Disease, Pre-Diabetes, Multiple Chemical Sensitivity Syndrome, Cognitive Changes, Gait Abnormality, Inability to Perform Activities of Daily Living Independently.]  General Interventions   General Interventions Discussed/Reviewed General Interventions Discussed, Labs, Vaccines, Doctor Visits, Referral to Nurse, Communication with, Level of Care, [employer], Health Screening, Annual Foot Exam, Lipid Profile, General Interventions Reviewed, Durable Medical Equipment (DME), Annual Eye Exam  [Encouraged]  Labs Hgb A1c every 3 months, Kidney Function  [Encouraged]  Vaccines COVID-19, Tetanus/Pertussis/Diphtheria, Shingles, RSV, Pneumonia, Flu  [Encouraged]  Doctor Visits Discussed/Reviewed Doctor Visits Discussed, Doctor Visits Reviewed, Annual Wellness Visits, PCP, Specialist  [Encouraged]  Health Screening Bone Density, Colonoscopy, Mammogram  [Encouraged]  Durable Medical Equipment (DME) BP Cuff, Walker, Wheelchair, Other  [Encouraged]  Wheelchair Standard  [Encouraged]  PCP/Specialist Visits Compliance with follow-up visit, Contact provider for referral to  [employer] provider for referral to PCP, Specialist, Dentist, Ophthalmologist  [Encouraged]  Communication with PCP/Specialists, RN, Pharmacists, Social Work  [Encouraged]  Level of Care Adult Daycare, Development worker, international aid, Air traffic controller, Assisted  Living, Skilled Nursing Facility  [Encouraged]  Applications Medicaid, Personal Care Services, FL-2  [Encouraged]  Exercise Interventions   Exercise Discussed/Reviewed Exercise Discussed, Assistive device use and maintanence, Exercise Reviewed, Physical Activity, Weight Managment  [Encouraged]  Physical Activity Discussed/Reviewed Physical Activity Discussed, Home Exercise Program (HEP), Physical Activity Reviewed, PREP, Gym, Types of exercise  [Encouraged]  Weight Management Weight maintenance  [Encouraged]  Education Interventions   Education Provided Provided Therapist, sports, Provided Web-based Education, Provided Education  [Encouraged]  Provided Verbal Education On Nutrition, Mental Health/Coping with Illness, Foot Care, Eye Care, Applications, Exercise, Blood Sugar Monitoring, Labs, Medication, Development worker, community, [facility], When to see the doctor  [Encouraged]  Labs Reviewed --  [N/A]  Applications Medicaid, Personal Care Services, FL-2  [Encouraged]  Mental Health Interventions   Mental Health Discussed/Reviewed Mental Health Discussed, Mental Health Reviewed, Coping Strategies, Crisis, Anxiety, Depression, Grief and Loss, Substance Abuse, Suicide, Other  [Domestic Violence]  Nutrition Interventions   Nutrition Discussed/Reviewed Nutrition Discussed, Adding fruits and vegetables, Increasing proteins, Decreasing fats, Decreasing salt, Supplemental nutrition, Decreasing sugar intake, Fluid intake, Nutrition Reviewed, Carbohydrate meal planning, Portion sizes  [Encouraged]  Pharmacy Interventions   Pharmacy Dicussed/Reviewed Pharmacy Topics Discussed, Medications and their functions, Medication Adherence, Pharmacy Topics Reviewed, Affording Medications  [Encouraged]  Medication Adherence --  [N/A]  Safety Interventions   Safety Discussed/Reviewed Safety Discussed, Safety Reviewed, Fall Risk, Home Safety  [Encouraged]  Home Safety Assistive Devices, Need for home safety assessment,  Refer for home visit, Refer for community resources  [Encouraged]  Advanced Directive Interventions   Advanced Directives Discussed/Reviewed Advanced Directives Discussed, Advanced Directives Reviewed  [Completed]      Assessed Social Determinant of Health Barriers. Discussed Plans for Ongoing Care Management Follow Up. Provided Careers information officer Information for Care Management Team Members. Screened for Signs & Symptoms of Depression, Related  to Chronic Disease State.  PHQ2 & PHQ9 Depression Screen Completed & Results Reviewed.  Suicidal Ideation & Homicidal Ideation Assessed - None Present.   Domestic Violence Assessed - None Present. Access to Weapons Assessed - None Present.   Active Listening & Reflection Utilized.  Verbalization of Feelings Encouraged.  Emotional Support Provided. Feelings of Caregiver Burnout & Fatigue Validated. Caregiver Stress Acknowledged. Caregiver Resources Reviewed. Caregiver Support Groups Mailed. Self-Enrollment in Caregiver Support Group of Interest Emphasized. Crisis Support Information, Agencies, Services, & Resources Discussed. Problem Solving Interventions Identified. Task-Centered Solutions Implemented.   Solution-Focused Strategies Developed. Acceptance & Commitment Therapy Introduced. Brief Cognitive Behavioral Therapy Initiated. Client-Centered Therapy Enacted. Reviewed Prescription Medications & Discussed Importance of Compliance. Encouraged Administration of Medications, Exactly as Prescribed. Quality of Sleep Assessed & Sleep Hygiene Techniques Promoted. Encouraged Increased Level of Activity & Exercise, as Tolerated. CSW Collaboration with Grandson/Healthcare Power of Attorney, Chauncy Passy to Discuss Higher Level of Care Placement Options for Patient (I.e. Memory Care Assisted Living, Extended Care, Skilled Nursing, Intermediate Level Care, Etc.) & Encouraged Consideration. CSW Collaboration with Grandson/Healthcare Power of Gerrit Friends,  Chauncy Passy to [employer] No In-Home Care Services, [employer], Warden/ranger, Catering manager., Covered Under Chief Strategy Officer through [employer].   CSW Collaboration with Grandson/Healthcare Power of Attorney, Chauncy Passy to Encourage Completion of Application for Special Assistance Long-Term Care Medicaid & Submission to [facility] of Social Services [phone]), for Processing.  CSW Collaboration with Grandson/Healthcare Power of Attorney, Chauncy Passy to Confirm Neither Patient, Nor Deceased Husband Were Veterans, Making Her Ineligible to Apply for Aid & Attendance Benefits, through [facility] [phone]). CSW Collaboration with Grandson/Healthcare Power of Gerrit Friends, Chauncy Passy to Request Review of Long-Term Care Facilities in [place], Emailed on 12/17/2022, & Encouraged Tour of Facilities of Interest, at [employer]. CSW Collaboration with Grandson/Healthcare Power of Gerrit Friends, Chauncy Passy to [employer] with CSW [phone]# [phone]), if He Has Questions, Needs Assistance, or If Additional Social Work Needs Are Identified Between Now & Our Next Follow-Up Outreach Call, Scheduled on 01/05/2023 at 10:30 AM. CSW Collaboration with Grandson/Healthcare Power of Attorney, Chauncy Passy to Encourage Attendance at Follow-Up Appointment for Patient with Gilford Silvius, Family Nurse Practitioner with [facility] [phone]), Scheduled on 01/21/2023 at 11:50 AM.  CSW Collaboration with Grandson/Healthcare Power of Attorney, Chauncy Passy to Encourage Attendance at Follow-Up Appointment for Patient with Dr. Levert Feinstein, Neurologist with [facility] [phone]), Scheduled on 02/17/2023 at 10:00 AM. CSW Collaboration with Grandson/Healthcare Power of Attorney, Chauncy Passy to Encourage  Attendance at Follow-Up Appointment for Patient with Dr. Rollene Rotunda, Cardiologist with [facility] [phone]), Scheduled on 03/02/2023 at 1:00 PM. CSW Collaboration with Gilford Silvius, Family Nurse Practitioner with [facility] [phone]), Via Secure Chat Message in Epic, to Request Completion of FL-2 Form for Placement Purposes.      Our next appointment is by telephone on 01/05/2023 at 10:30 am.  Please call the care guide team at [phone] if you need to cancel or reschedule your appointment.   If you are experiencing a Mental Health or Behavioral Health Crisis or need someone to talk to, please call the Suicide and Crisis Lifeline: 988 call the [place] National Suicide Prevention Lifeline: 256-166-6828 or TTY: 229 883 2711 TTY 567-569-5012) to talk to a trained counselor call 1-800-273-TALK (toll free, 24 hour hotline) go to [employer]  [facility] [address] [place] [phone]) call the [facility] Crisis Line: [phone] call 911  Patient verbalizes understanding of instructions and care plan provided today and agrees to view in MyChart. Active MyChart status and patient understanding of how to access instructions and care plan via MyChart confirmed with patient.     Telephone follow up appointment with care management team member scheduled for:  01/05/2023 at 10:30 am.  Danford Bad, BSW, MSW, LCSW  Embedded Practice Social Work Case Manager  [facility], Population Health Direct Dial: [phone]  Fax: [phone] Email: [email or web address] Website: [email or web address]

## 2022-12-20 NOTE — Telephone Encounter (Signed)
Aware and verbalizes understanding.  

## 2022-12-20 NOTE — Telephone Encounter (Signed)
Daughter aware.  Lab appointment scheduled.  She is scheduled for wed

## 2022-12-21 ENCOUNTER — Telehealth: Payer: Self-pay | Admitting: Family Medicine

## 2022-12-21 NOTE — Telephone Encounter (Signed)
FYI

## 2022-12-22 ENCOUNTER — Telehealth: Payer: Self-pay

## 2022-12-22 ENCOUNTER — Other Ambulatory Visit: Payer: Medicare Other

## 2022-12-22 DIAGNOSIS — R7989 Other specified abnormal findings of blood chemistry: Secondary | ICD-10-CM

## 2022-12-22 NOTE — Telephone Encounter (Signed)
Daughter aware and verbalizes understanding. 

## 2022-12-22 NOTE — Telephone Encounter (Signed)
Pt's daughter reports that pt was dizzy every day when she was taking sodium chloride 7 days, got better when she went down to 3 days but now that she is up to 5 days she is saying she is dizzy again. Daughter reports pt's diet is not good says only thing she will eat is granola and protein bars. Daughter also requests if MMSE can be done again for pt at her appt next month. States pt has good days and bad days and the day of the last MMSE was a good day for pt.

## 2022-12-22 NOTE — Telephone Encounter (Signed)
She was here for labs today. Once results are available, we can adjust sodium regimen if warranted. Pt needs to follow up with neurology as referred.

## 2022-12-23 ENCOUNTER — Ambulatory Visit: Payer: Medicare Other | Admitting: Family Medicine

## 2022-12-23 LAB — CMP14+EGFR
ALT: 18 IU/L (ref 0–32)
AST: 16 IU/L (ref 0–40)
Albumin: 4.3 g/dL (ref 3.7–4.7)
Alkaline Phosphatase: 73 IU/L (ref 44–121)
BUN/Creatinine Ratio: 22 (ref 12–28)
BUN: 18 mg/dL (ref 8–27)
Bilirubin Total: 0.4 mg/dL (ref 0.0–1.2)
CO2: 24 mmol/L (ref 20–29)
Calcium: 9.3 mg/dL (ref 8.7–10.3)
Chloride: 95 mmol/L — ABNORMAL LOW (ref 96–106)
Creatinine, Ser: 0.82 mg/dL (ref 0.57–1.00)
Globulin, Total: 2 g/dL (ref 1.5–4.5)
Glucose: 95 mg/dL (ref 70–99)
Potassium: 4.7 mmol/L (ref 3.5–5.2)
Sodium: 130 mmol/L — ABNORMAL LOW (ref 134–144)
Total Protein: 6.3 g/dL (ref 6.0–8.5)
eGFR: 68 mL/min/{1.73_m2} (ref >59)

## 2023-01-05 ENCOUNTER — Other Ambulatory Visit: Payer: Self-pay | Admitting: *Deleted

## 2023-01-05 ENCOUNTER — Ambulatory Visit: Payer: Self-pay | Admitting: *Deleted

## 2023-01-05 DIAGNOSIS — E871 Hypo-osmolality and hyponatremia: Secondary | ICD-10-CM

## 2023-01-05 NOTE — Patient Instructions (Signed)
Visit Information  Thank you for taking time to visit with me today. Please don't hesitate to contact me if I can be of assistance to you.   Following are the goals we discussed today:   Goals Addressed             This Visit's Progress    COMPLETED: Assist with Pursuing Higher Level of Care Options.   On track    Care Coordination Interventions:  Interventions Today    Flowsheet Row Most Recent Value  Chronic Disease   Chronic disease during today's visit Hypertension (HTN), Atrial Fibrillation (AFib), Other, Chronic Kidney Disease/End Stage Renal Disease (ESRD)  [Raynaud Disease, Pre-Diabetes, Multiple Chemical Sensitivity Syndrome, Cognitive Changes, Gait Abnormality, Inability to Perform Activities of Daily Living Independently.]  General Interventions   General Interventions Discussed/Reviewed General Interventions Discussed, Labs, Vaccines, Doctor Visits, Referral to Nurse, Communication with, Level of Care, [facility], Health Screening, Annual Foot Exam, Lipid Profile, General Interventions Reviewed, Durable Medical Equipment (DME), Annual Eye Exam  [Encouraged]  Labs Hgb A1c every 3 months, Kidney Function  [Encouraged]  Vaccines COVID-19, Tetanus/Pertussis/Diphtheria, Shingles, RSV, Pneumonia, Flu  [Encouraged]  Doctor Visits Discussed/Reviewed Doctor Visits Discussed, Doctor Visits Reviewed, Annual Wellness Visits, PCP, Specialist  [Encouraged]  Health Screening Bone Density, Colonoscopy, Mammogram  [Encouraged]  Durable Medical Equipment (DME) BP Cuff, Walker, Wheelchair, Other  [Encouraged]  Wheelchair Standard  [Encouraged]  PCP/Specialist Visits Compliance with follow-up visit, Contact provider for referral to  [employer] provider for referral to PCP, Specialist, Dentist, Ophthalmologist  [Encouraged]  Communication with PCP/Specialists, RN, Pharmacists, Social Work  [Encouraged]  Level of Care Adult Daycare, Development worker, international aid, Air traffic controller,  Assisted Living, Skilled Nursing Facility  [Encouraged]  Applications Medicaid, Personal Care Services, [place]  [Encouraged]  Exercise Interventions   Exercise Discussed/Reviewed Exercise Discussed, Assistive device use and maintanence, Exercise Reviewed, Physical Activity, Weight Managment  [Encouraged]  Physical Activity Discussed/Reviewed Physical Activity Discussed, Home Exercise Program (HEP), Physical Activity Reviewed, PREP, Gym, Types of exercise  [Encouraged]  Weight Management Weight maintenance  [Encouraged]  Education Interventions   Education Provided Provided Therapist, sports, Provided Web-based Education, Provided Education  [Encouraged]  Provided Verbal Education On Nutrition, Mental Health/Coping with Illness, Foot Care, Eye Care, Applications, Exercise, Blood Sugar Monitoring, Labs, Medication, Development worker, community, [facility], When to see the doctor  [Encouraged]  Labs Reviewed --  [N/A]  Applications Medicaid, Personal Care Services, [place]  [Encouraged]  Mental Health Interventions   Mental Health Discussed/Reviewed Mental Health Discussed, Mental Health Reviewed, Coping Strategies, Crisis, Anxiety, Depression, Grief and Loss, Substance Abuse, Suicide, Other  [Domestic Violence]  Nutrition Interventions   Nutrition Discussed/Reviewed Nutrition Discussed, Adding fruits and vegetables, Increasing proteins, Decreasing fats, Decreasing salt, Supplemental nutrition, Decreasing sugar intake, Fluid intake, Nutrition Reviewed, Carbohydrate meal planning, Portion sizes  [Encouraged]  Pharmacy Interventions   Pharmacy Dicussed/Reviewed Pharmacy Topics Discussed, Medications and their functions, Medication Adherence, Pharmacy Topics Reviewed, Affording Medications  [Encouraged]  Medication Adherence --  [N/A]  Safety Interventions   Safety Discussed/Reviewed Safety Discussed, Safety Reviewed, Fall Risk, Home Safety  [Encouraged]  Home Safety Assistive Devices, Need for home safety  assessment, Refer for home visit, Refer for community resources  [Encouraged]  Advanced Directive Interventions   Advanced Directives Discussed/Reviewed Advanced Directives Discussed, Advanced Directives Reviewed  [Completed]      Active Listening & Reflection Utilized.  Verbalization of Feelings Encouraged.  Emotional Support Provided. Caregiver Stress, Burnout, & Fatigue Acknowledged. Caregiver Resources Revisited. Caregiver Support Groups Discussed. Self-Enrollment in Caregiver  Support Group of Interest Emphasized, from List Provided. Problem Solving Interventions Activated. Task-Centered Solutions Employed.   Solution-Focused Strategies Implemented. Acceptance & Commitment Therapy Indicated. Cognitive Behavioral Therapy Performed. Client-Centered Therapy Initiated. Encouraged Routine Engagement in Activities of Interest, Inside & Outside the Home. Encouraged Administration of Medications, Exactly as Prescribed. Encouraged Increased Level of Activity & Exercise, as Tolerated. Encouraged Daily Implementation of Deep Breathing Exercises, Relaxation Techniques, & Mindfulness Meditation Strategies. CSW Collaboration with Grandson/Healthcare Power of Gerrit Friends, Chauncy Passy to Confirm Interest in Pursuing Higher Level of Care Placement Options for Patient, But Indicated No Assistance Needed from CSW.  CSW Collaboration with Grandson/Healthcare Power of Gerrit Friends, Chauncy Passy to [employer] with CSW [phone]# [phone]), if He Has Questions, Needs Assistance, or If He Changes His Mind About Wanting to Receive Social Work Assistance through [employer].  CSW Collaboration with Grandson/Healthcare Power of Attorney, Chauncy Passy to Encourage Attendance at Follow-Up Appointment for Patient with Gilford Silvius, Family Nurse Practitioner with [facility] [phone]), Scheduled on 01/21/2023 at 11:50 AM.  CSW Collaboration with Grandson/Healthcare Power of  Ronnette Hila to Encourage Attendance at Follow-Up Appointment for Patient with Dr. Levert Feinstein, Neurologist with [facility] [phone]), Scheduled on 02/17/2023 at 10:00 AM. CSW Collaboration with Grandson/Healthcare Power of Ronnette Hila to Encourage Attendance at Follow-Up Appointment for Patient with Dr. Rollene Rotunda, Cardiologist with [facility] [place] [phone]), Scheduled on 03/02/2023 at 1:00 PM.      Please call the care guide team at [phone] if you need to cancel or reschedule your appointment.   If you are experiencing a Mental Health or Behavioral Health Crisis or need someone to talk to, please call the Suicide and Crisis Lifeline: 988 call the [place] National Suicide Prevention Lifeline: 402-237-5727 or TTY: 503 540 6809 TTY 272-315-5067) to talk to a trained counselor call 1-800-273-TALK (toll free, 24 hour hotline) go to [facility] [address] [place] [phone]) call the [facility] Crisis Line: [phone] call 911  Patient verbalizes understanding of instructions and care plan provided today and agrees to view in MyChart. Active MyChart status and patient understanding of how to access instructions and care plan via MyChart confirmed with patient.     No further follow up required.  Danford Bad, BSW, MSW, Printmaker Social Work Case Set designer Health  [facility], Population Health Direct Dial: [phone]  Fax: [phone] Email: [email or web address] Website: [email or web address]

## 2023-01-05 NOTE — Patient Outreach (Signed)
Care Coordination   Follow Up Visit Note   01/05/2023  Name: Heidi Dorsey MRN: [id number] DOB: 04-22-1930  Heidi Dorsey is a 89 y.o. year old female who sees Rakes, Doralee Albino, FNP for primary care. I spoke with patient's grandson/healthcare power of attorney, Chauncy Passy by phone today.  What matters to the patients health and wellness today?  Assist with Pursuing Higher Level of Care Options.    Goals Addressed             This Visit's Progress    COMPLETED: Assist with Pursuing Higher Level of Care Options.   On track    Care Coordination Interventions:  Interventions Today    Flowsheet Row Most Recent Value  Chronic Disease   Chronic disease during today's visit Hypertension (HTN), Atrial Fibrillation (AFib), Other, Chronic Kidney Disease/End Stage Renal Disease (ESRD)  [Raynaud Disease, Pre-Diabetes, Multiple Chemical Sensitivity Syndrome, Cognitive Changes, Gait Abnormality, Inability to Perform Activities of Daily Living Independently.]  General Interventions   General Interventions Discussed/Reviewed General Interventions Discussed, Labs, Vaccines, Doctor Visits, Referral to Nurse, Communication with, Level of Care, [facility], Health Screening, Annual Foot Exam, Lipid Profile, General Interventions Reviewed, Durable Medical Equipment (DME), Annual Eye Exam  [Encouraged]  Labs Hgb A1c every 3 months, Kidney Function  [Encouraged]  Vaccines COVID-19, Tetanus/Pertussis/Diphtheria, Shingles, RSV, Pneumonia, Flu  [Encouraged]  Doctor Visits Discussed/Reviewed Doctor Visits Discussed, Doctor Visits Reviewed, Annual Wellness Visits, PCP, Specialist  [Encouraged]  Health Screening Bone Density, Colonoscopy, Mammogram  [Encouraged]  Durable Medical Equipment (DME) BP Cuff, Walker, Wheelchair, Other  [Encouraged]  Wheelchair Standard  [Encouraged]  PCP/Specialist Visits Compliance with follow-up visit, Contact provider for referral to  [employer] provider for referral to PCP, Specialist, Dentist, Ophthalmologist  [Encouraged]  Communication with PCP/Specialists, RN, Pharmacists, Social Work  [Encouraged]  Level of Care Adult Daycare, Development worker, international aid, Air traffic controller, Assisted Living, Skilled Nursing Facility  [Encouraged]  Applications Medicaid, Personal Care Services, [place]-2  [Encouraged]  Exercise Interventions   Exercise Discussed/Reviewed Exercise Discussed, Assistive device use and maintanence, Exercise Reviewed, Physical Activity, Weight Managment  [Encouraged]  Physical Activity Discussed/Reviewed Physical Activity Discussed, Home Exercise Program (HEP), Physical Activity Reviewed, PREP, Gym, Types of exercise  [Encouraged]  Weight Management Weight maintenance  [Encouraged]  Education Interventions   Education Provided Provided Therapist, sports, Provided Web-based Education, Provided Education  [Encouraged]  Provided Verbal Education On Nutrition, Mental Health/Coping with Illness, Foot Care, Eye Care, Applications, Exercise, Blood Sugar Monitoring, Labs, Medication, Development worker, community, [facility], When to see the doctor  [Encouraged]  Labs Reviewed --  [N/A]  Applications Medicaid, Personal Care Services, [place]  [Encouraged]  Mental Health Interventions   Mental Health Discussed/Reviewed Mental Health Discussed, Mental Health Reviewed, Coping Strategies, Crisis, Anxiety, Depression, Grief and Loss, Substance Abuse, Suicide, Other  [Domestic Violence]  Nutrition Interventions   Nutrition Discussed/Reviewed Nutrition Discussed, Adding fruits and vegetables, Increasing proteins, Decreasing fats, Decreasing salt, Supplemental nutrition, Decreasing sugar intake, Fluid intake, Nutrition Reviewed, Carbohydrate meal planning, Portion sizes  [Encouraged]  Pharmacy Interventions   Pharmacy Dicussed/Reviewed Pharmacy Topics Discussed, Medications and their functions, Medication Adherence, Pharmacy Topics Reviewed,  Affording Medications  [Encouraged]  Medication Adherence --  [N/A]  Safety Interventions   Safety Discussed/Reviewed Safety Discussed, Safety Reviewed, Fall Risk, Home Safety  [Encouraged]  Home Safety Assistive Devices, Need for home safety assessment, Refer for home visit, Refer for community resources  [Encouraged]  Advanced Directive Interventions   Advanced Directives Discussed/Reviewed Advanced Directives Discussed, Advanced Directives Reviewed  [  Completed]      Active Listening & Reflection Utilized.  Verbalization of Feelings Encouraged.  Emotional Support Provided. Caregiver Stress, Burnout, & Fatigue Acknowledged. Caregiver Resources Revisited. Caregiver Support Groups Discussed. Self-Enrollment in Caregiver Support Group of Interest Emphasized, from List Provided. Problem Solving Interventions Activated. Task-Centered Solutions Employed.   Solution-Focused Strategies Implemented. Acceptance & Commitment Therapy Indicated. Cognitive Behavioral Therapy Performed. Client-Centered Therapy Initiated. Encouraged Routine Engagement in Activities of Interest, Inside & Outside the Home. Encouraged Administration of Medications, Exactly as Prescribed. Encouraged Increased Level of Activity & Exercise, as Tolerated. Encouraged Daily Implementation of Deep Breathing Exercises, Relaxation Techniques, & Mindfulness Meditation Strategies. CSW Collaboration with Grandson/Healthcare Power of Gerrit Friends, Chauncy Passy to Confirm Interest in Pursuing Higher Level of Care Placement Options for Patient, But Indicated No Assistance Needed from CSW.  CSW Collaboration with Grandson/Healthcare Power of Gerrit Friends, Chauncy Passy to [employer] with CSW [phone]# [phone]), if He Has Questions, Needs Assistance, or If He Changes His Mind About Wanting to Receive Social Work Assistance through [employer].  CSW Collaboration with Grandson/Healthcare Power of Attorney, Chauncy Passy to Encourage Attendance at  Follow-Up Appointment for Patient with Gilford Silvius, Family Nurse Practitioner with [facility] [phone]), Scheduled on 01/21/2023 at 11:50 AM.  CSW Collaboration with Grandson/Healthcare Power of Ronnette Hila to Encourage Attendance at Follow-Up Appointment for Patient with Dr. Levert Feinstein, Neurologist with [facility] [phone]), Scheduled on 02/17/2023 at 10:00 AM. CSW Collaboration with Grandson/Healthcare Power of Ronnette Hila to Encourage Attendance at Follow-Up Appointment for Patient with Dr. Rollene Rotunda, Cardiologist with [facility] [place] [phone]), Scheduled on 03/02/2023 at 1:00 PM.      SDOH assessments and interventions completed:  Yes.  Care Coordination Interventions:  Yes, provided.   Follow up plan: No further intervention required.   Encounter Outcome:  Patient Visit Completed.   Danford Bad, BSW, MSW, Printmaker Social Work Case Set designer Health  [facility], Population Health Direct Dial: [phone]  Fax: [phone] Email: [email or web address] Website: [email or web address]

## 2023-01-10 ENCOUNTER — Telehealth: Payer: Self-pay | Admitting: Family Medicine

## 2023-01-10 MED ORDER — LOSARTAN POTASSIUM 50 MG PO TABS: 50.0000 mg | ORAL_TABLET | Freq: Every day | ORAL | 0 refills | Status: DC

## 2023-01-10 MED ORDER — AMIODARONE HCL 100 MG PO TABS: 100.0000 mg | ORAL_TABLET | Freq: Every day | ORAL | 0 refills | Status: DC

## 2023-01-10 NOTE — Telephone Encounter (Signed)
  Prescription Request  01/10/2023  Is this a "Controlled Substance" medicine? No, I don't think so!  Have you seen your PCP in the last 2 weeks? no  If YES, route message to pool  -  If NO, patient needs to be scheduled for appointment.  What is the name of the medication or equipment? Amiodarone & Losartan  Have you contacted your pharmacy to request a refill? yes   Which pharmacy would you like this sent to? [facility]   Patient notified that their request is being sent to the clinical staff for review and that they should receive a response within 2 business days.

## 2023-01-10 NOTE — Telephone Encounter (Signed)
Aware refills sent to pharmacy, went over her Losartan dose and directions.

## 2023-01-21 ENCOUNTER — Encounter: Payer: Self-pay | Admitting: Family Medicine

## 2023-01-21 ENCOUNTER — Ambulatory Visit (INDEPENDENT_AMBULATORY_CARE_PROVIDER_SITE_OTHER): Payer: Medicare Other | Admitting: Family Medicine

## 2023-01-21 ENCOUNTER — Telehealth: Payer: Self-pay | Admitting: Family Medicine

## 2023-01-21 VITALS — BP 148/88 | HR 62 | Temp 96.8°F | Ht 61.0 in | Wt 129.8 lb

## 2023-01-21 DIAGNOSIS — E871 Hypo-osmolality and hyponatremia: Secondary | ICD-10-CM | POA: Diagnosis not present

## 2023-01-21 DIAGNOSIS — I129 Hypertensive chronic kidney disease with stage 1 through stage 4 chronic kidney disease, or unspecified chronic kidney disease: Secondary | ICD-10-CM

## 2023-01-21 DIAGNOSIS — I10 Essential (primary) hypertension: Secondary | ICD-10-CM | POA: Diagnosis not present

## 2023-01-21 DIAGNOSIS — N1831 Chronic kidney disease, stage 3a: Secondary | ICD-10-CM

## 2023-01-21 DIAGNOSIS — R2681 Unsteadiness on feet: Secondary | ICD-10-CM

## 2023-01-21 DIAGNOSIS — Z23 Encounter for immunization: Secondary | ICD-10-CM

## 2023-01-21 DIAGNOSIS — R4189 Other symptoms and signs involving cognitive functions and awareness: Secondary | ICD-10-CM

## 2023-01-21 DIAGNOSIS — F03911 Unspecified dementia, unspecified severity, with agitation: Secondary | ICD-10-CM

## 2023-01-21 DIAGNOSIS — G479 Sleep disorder, unspecified: Secondary | ICD-10-CM | POA: Diagnosis not present

## 2023-01-21 DIAGNOSIS — R269 Unspecified abnormalities of gait and mobility: Secondary | ICD-10-CM

## 2023-01-21 NOTE — Progress Notes (Signed)
Subjective:  Patient ID: Heidi Dorsey, female    DOB: 14-Jul-1930, 89 y.o.   MRN: [id number]  Patient Care Team: Sonny Masters, FNP as PCP - General (Family Medicine) Rollene Rotunda, MD as PCP - Cardiology (Cardiology) Glendale Chard, DO as Consulting Physician (Neurology) Clinton Gallant, RN as [facility] Care Management   Chief Complaint:  Medical Management of Chronic Issues   HPI: Heidi Dorsey is a 89 y.o. female presenting on 01/21/2023 for Medical Management of Chronic Issues   Discussed the use of AI scribe software for clinical note transcription with the patient, who gave verbal consent to proceed.  History of Present Illness   The patient, a retired Midwife, presents with a primary complaint of disrupted sleep due to frequent nocturnal urination. The patient reports waking up hourly to urinate, often producing only a few drops. This disrupted sleep pattern has led to daytime sleepiness and a lack of REM sleep, which the patient believes is affecting her cognitive function. Previous attempts to manage this issue with over-the-counter melatonin were unsuccessful, but the dosage and duration of use are unclear.  The patient also reports worsening balance issues, which have led to frequent falls and subsequent bruising. The patient uses a cane for support and has an upcoming appointment with a cardiologist. The patient's balance issues are exacerbated by rushing and quick turns, and she has been advised to slow down and make deliberate movements.  Additionally, the patient has been experiencing increasing difficulty with fine motor skills due to neuropathy in her hands. This has made tasks such as buttoning clothes challenging. The patient has been taking B12 vitamins for this issue.  The patient also reports severe osteoarthritis in her feet, leading to pain and difficulty walking. The patient's toes have twisted and are digging  into each other, causing further discomfort. Previous interventions, such as inserts for shoes, have not provided lasting relief.  The patient lives alone and reports feelings of loneliness and isolation. She expresses dissatisfaction with her current living situation and a desire for more intellectual stimulation. The patient's stress levels are high, and there is significant tension between the patient and her primary caregiver, her daughter.       Relevant past medical, surgical, family, and social history reviewed and updated as indicated.  Allergies and medications reviewed and updated. Data reviewed: Chart in Epic.   Past Medical History:  Diagnosis Date   Asthma    BPPV (benign paroxysmal positional vertigo)    Claustrophobia    Diverticulosis    GERD (gastroesophageal reflux disease)    H/O degenerative disc disease    Hiatal hernia    Hyperlipidemia    Hypertension    Kidney stones    Migraines    Osteoarthritis    Caused feet deformity   Osteopenia    Prediabetes    Preglaucoma    Raynauds syndrome    Rectocele    Rosacea    Sciatica    Sleep apnea    Spinal stenosis    Vertebrobasilar artery syndrome     Past Surgical History:  Procedure Laterality Date   ABDOMINAL HYSTERECTOMY  1978   ANTERIOR (CYSTOCELE) AND POSTERIOR REPAIR (RECTOCELE) WITH XENFORM GRAFT AND SACROSPINOUS FIXATION     BILATERAL OOPHORECTOMY  1997   bladder stones     kidney colic/ procedure performed in 1973 at [facility]   BLADDER SUSPENSION     BREAST BIOPSY Left 12/1987   neg  BREAST BIOPSY Right 02/1977   neg papilloma   CARDIAC CATHETERIZATION  2000   HERNIA REPAIR     plantar fibroma  Left 09/1997   Excised    Social History   Socioeconomic History   Marital status: Single    Spouse name: Not on file   Number of children: 2   Years of education: 12   Highest education level: 12th grade  Occupational History   Occupation: Retired    Comment: Scientist, product/process development   Tobacco Use   Smoking status: Never    Passive exposure: Never   Smokeless tobacco: Never   Tobacco comments:    Verified by Grandson/Healthcare Power of Attorney, Chauncy Passy.  Vaping Use   Vaping status: Never Used  Substance and Sexual Activity   Alcohol use: Yes    Alcohol/week: 2.0 standard drinks of alcohol    Types: 2 Glasses of wine per week    Comment: per week   Drug use: Never   Sexual activity: Not Currently    Partners: Male    Birth control/protection: Surgical  Other Topics Concern   Not on file  Social History Narrative   Right Handed   Lives in an apartment complex and her apartment is on ground level.    Drinks Half [employer] Coffee and Tea   Originally from [place], then lived in [place] most of her life - moved here 09/2019 - feels out of place, having trouble finding others with similar interests   She is agnostic - not religious (although raised Catholic) - strong scientific beliefs    Social Determinants of Health   Financial Resource Strain: Low Risk  (12/19/2022)   Overall Financial Resource Strain (CARDIA)    Difficulty of Paying Living Expenses: Not hard at all  Food Insecurity: No Food Insecurity (12/19/2022)   Hunger Vital Sign    Worried About Running Out of Food in the Last Year: Never true    Ran Out of Food in the Last Year: Never true  Transportation Needs: No Transportation Needs (12/19/2022)   PRAPARE - Administrator, Civil Service (Medical): No    Lack of Transportation (Non-Medical): No  Physical Activity: Inactive (12/19/2022)   Exercise Vital Sign    Days of Exercise per Week: 0 days    Minutes of Exercise per Session: 0 min  Stress: No Stress Concern Present (12/19/2022)   [employer] of Occupational Health - Occupational Stress Questionnaire    Feeling of Stress : Not at all  Social Connections: Moderately Isolated (12/19/2022)   Social Connection and Isolation Panel [NHANES]    Frequency of Communication with Friends and  Family: More than three times a week    Frequency of Social Gatherings with Friends and Family: More than three times a week    Attends Religious Services: More than 4 times per year    Active Member of [employer] or Organizations: No    Attends Banker Meetings: Never    Marital Status: Divorced  Catering manager Violence: Not At Risk (12/19/2022)   Humiliation, Afraid, Rape, and Kick questionnaire    Fear of Current or Ex-Partner: No    Emotionally Abused: No    Physically Abused: No    Sexually Abused: No    Outpatient Encounter Medications as of 01/21/2023  Medication Sig   acetaminophen (TYLENOL) 325 MG tablet Take 2 tablets (650 mg total) by mouth every 6 (six) hours as needed for mild pain (or Fever >/= 101).  amiodarone (PACERONE) 100 MG tablet Take 1 tablet (100 mg total) by mouth daily.   ASCORBIC ACID PO Take 1 tablet by mouth daily. OTC vitamin C   BIOTIN PO Take 1 tablet by mouth daily.   Cholecalciferol (VITAMIN D-3 PO) Take 1 capsule by mouth daily.   cyanocobalamin (VITAMIN B12) 1000 MCG tablet Take 1,000 mcg by mouth daily.   losartan (COZAAR) 50 MG tablet Take 1 tablet (50 mg total) by mouth daily.   Multiple Vitamins-Minerals (CENTRUM SILVER 50+WOMEN) TABS Take 1 tablet by mouth daily.   Omega-3 Fatty Acids (FISH OIL PO) Take 1 capsule by mouth daily.   sodium chloride 1 g tablet Take 1 tablet 5 days per week.   No facility-administered encounter medications on file as of 01/21/2023.    Allergies  Allergen Reactions   Allegra [Fexofenadine] Other (See Comments)    Syncope   Estrace [Estradiol] Other (See Comments)    Headache   Miacalcin [Calcitonin] Palpitations    Left bundle branch block   Nsaids Other (See Comments)    Stomach ulcer after taking for 3 days in 2009. Negative H. Pylori test.   Benadryl [Diphenhydramine] Other (See Comments)    Syncope   Atrovent Hfa [Ipratropium Bromide Hfa] Other (See Comments)    Syncope     Paraphenylenediamine Other (See Comments)    Unknown reaction   Augmentin [Amoxicillin-Pot Clavulanate] Rash    OK to take plain amoxicillin   Biaxin [Clarithromycin] Rash    OK to take azithromycin   Cipro [Ciprofloxacin Hcl] Rash   Codeine Other (See Comments)    Unknown reaction   Epipen [Epinephrine] Other (See Comments)    Unknown reaction   Erythromycin Other (See Comments)    Stomach irritation   Flonase [Fluticasone] Other (See Comments)    Unknown reaction Similar reactions to corticoid steroids   Latex Rash   Macrobid [Nitrofurantoin] Other (See Comments)    Multiple mild symptoms - would prefer not to take again   Other Other (See Comments)    Propanol amine Anti-cholinergic compounds   Silenor [Doxepin Hcl] Other (See Comments)    Heart arrhythmia   Sulfa Antibiotics Other (See Comments)    Unknown reaction   Sumycin [Tetracycline] Other (See Comments)    Mouth sores OK to take doxycycline   Zestril [Lisinopril] Other (See Comments)    Bruising Cankers Arthralgias    ROS per HPI, otherwise unremarkable      Objective:  BP (!) 148/88   Pulse 62   Temp (!) 96.8 F (36 C) (Temporal)   Ht 5\' 1"  (1.549 m)   Wt 129 lb 12.8 oz (58.9 kg)   SpO2 99%   BMI 24.53 kg/m    Wt Readings from Last 3 Encounters:  01/21/23 129 lb 12.8 oz (58.9 kg)  12/10/22 130 lb (59 kg)  12/02/22 133 lb (60.3 kg)    Physical Exam Vitals and nursing note reviewed.  Constitutional:      Appearance: Normal appearance.  HENT:     Head: Normocephalic and atraumatic.     Mouth/Throat:     Mouth: Mucous membranes are moist.  Eyes:     Conjunctiva/sclera: Conjunctivae normal.     Pupils: Pupils are equal, round, and reactive to light.  Cardiovascular:     Rate and Rhythm: Normal rate. Rhythm irregularly irregular.  Pulmonary:     Effort: Pulmonary effort is normal.     Breath sounds: Normal breath sounds.  Musculoskeletal:     Cervical  back: Normal range of motion.   Skin:    General: Skin is warm and dry.     Capillary Refill: Capillary refill takes less than 2 seconds.  Neurological:     General: No focal deficit present.     Mental Status: She is alert and oriented to person, place, and time.  Psychiatric:        Mood and Affect: Mood normal. Affect is angry.        Behavior: Behavior is agitated.        Thought Content: Thought content normal.        Judgment: Judgment normal.     Results for orders placed or performed in visit on 12/22/22  CMP14+EGFR  Result Value Ref Range   Glucose 95 70 - 99 mg/dL   BUN 18 8 - 27 mg/dL   Creatinine, Ser 6.29 0.57 - 1.00 mg/dL   eGFR 68 >52 WU/XLK/4.40   BUN/Creatinine Ratio 22 12 - 28   Sodium 130 (L) 134 - 144 mmol/L   Potassium 4.7 3.5 - 5.2 mmol/L   Chloride 95 (L) 96 - 106 mmol/L   CO2 24 20 - 29 mmol/L   Calcium 9.3 8.7 - 10.3 mg/dL   Total Protein 6.3 6.0 - 8.5 g/dL   Albumin 4.3 3.7 - 4.7 g/dL   Globulin, Total 2.0 1.5 - 4.5 g/dL   Bilirubin Total 0.4 0.0 - 1.2 mg/dL   Alkaline Phosphatase 73 44 - 121 IU/L   AST 16 0 - 40 IU/L   ALT 18 0 - 32 IU/L       Pertinent labs & imaging results that were available during my care of the patient were reviewed by me and considered in my medical decision making.  Assessment & Plan:   Kyley was seen today for medical management of chronic issues.   Assessment and Plan    Insomnia Difficulty maintaining sleep due to frequent urination. Previous trial of melatonin with unclear dosing and duration. -Trial of melatonin with 3-5mg  fast acting and 5-10mg  extended release at bedtime.  Nocturia Frequent awakening due to sensation of needing to urinate, resulting in disrupted sleep. -Consider evaluation for causes of nocturia if sleep does not improve with melatonin trial.  Hypertension On sodium repletion 5 days a week. Last sodium level was 130 in September. -Check sodium level today.  Neuropathy Difficulty with fine motor tasks due to  neuropathy in hands. Taking B vitamins. -Continue B vitamins.  Osteoarthritis Severe foot pain and deformity. Difficulty with toenail care. -Referral to podiatrist.  Balance issues Increased falls risk due to balance problems. -Home health referral for physical therapy and aid.  General Health Maintenance -Administer flu shot today. -Check blood work today. -Follow up in 2-3 months or sooner if blood work is abnormal.     Caregiver strain - have placed referral to home health for aid, PT, and Child psychotherapist.   Diagnoses and all orders for this visit:  Hyponatremia -     CMP14+EGFR -     Ambulatory referral to Home Health  Stage 3a chronic kidney disease (HCC) -     CMP14+EGFR -     Ambulatory referral to Home Health  Essential hypertension -     CMP14+EGFR -     Thyroid Panel With TSH -     Ambulatory referral to Home Health  Sleep disturbance -     Ambulatory referral to Home Health  Unstable gait -     Ambulatory referral to Home  Health  Cognitive changes -     Ambulatory referral to Home Health  Gait abnormality -     Ambulatory referral to Home Health  Encounter for immunization -     Flu vaccine trivalent PF, 6mos and older(Flulaval,Afluria,Fluarix,Fluzone)    Continue all other maintenance medications.  Follow up plan: Return if symptoms worsen or fail to improve.   Continue healthy lifestyle choices, including diet (rich in fruits, vegetables, and lean proteins, and low in salt and simple carbohydrates) and exercise (at least 30 minutes of moderate physical activity daily).    The above assessment and management plan was discussed with the patient. The patient verbalized understanding of and has agreed to the management plan. Patient is aware to call the clinic if they develop any new symptoms or if symptoms persist or worsen. Patient is aware when to return to the clinic for a follow-up visit. Patient educated on when it is appropriate to go to the  emergency department.   Kari Baars, FNP-C [facility] [phone]

## 2023-01-21 NOTE — Patient Instructions (Signed)
3-5 mg fast acting melatonin 5-10 mg long acting melatonin

## 2023-01-21 NOTE — Telephone Encounter (Signed)
Patient aware and verbalizes understanding. 

## 2023-01-22 LAB — THYROID PANEL WITH TSH
Free Thyroxine Index: 3 (ref 1.2–4.9)
T3 Uptake Ratio: 33 % (ref 24–39)
T4, Total: 9.1 ug/dL (ref 4.5–12.0)
TSH: 3.16 u[IU]/mL (ref 0.450–4.500)

## 2023-01-22 LAB — CMP14+EGFR
ALT: 16 [IU]/L (ref 0–32)
AST: 17 [IU]/L (ref 0–40)
Albumin: 4.4 g/dL (ref 3.7–4.7)
Alkaline Phosphatase: 68 [IU]/L (ref 44–121)
BUN/Creatinine Ratio: 26 (ref 12–28)
BUN: 20 mg/dL (ref 8–27)
Bilirubin Total: 0.4 mg/dL (ref 0.0–1.2)
CO2: 21 mmol/L (ref 20–29)
Calcium: 9.3 mg/dL (ref 8.7–10.3)
Chloride: 97 mmol/L (ref 96–106)
Creatinine, Ser: 0.76 mg/dL (ref 0.57–1.00)
Globulin, Total: 1.7 g/dL (ref 1.5–4.5)
Glucose: 94 mg/dL (ref 70–99)
Potassium: 4.6 mmol/L (ref 3.5–5.2)
Sodium: 136 mmol/L (ref 134–144)
Total Protein: 6.1 g/dL (ref 6.0–8.5)
eGFR: 75 mL/min/{1.73_m2} (ref >59)

## 2023-01-27 ENCOUNTER — Other Ambulatory Visit: Payer: Self-pay | Admitting: Family Medicine

## 2023-01-27 DIAGNOSIS — E871 Hypo-osmolality and hyponatremia: Secondary | ICD-10-CM

## 2023-01-27 MED ORDER — SODIUM CHLORIDE 1 G PO TABS
ORAL_TABLET | ORAL | 0 refills | Status: DC
Start: 2023-01-27 — End: 2023-03-14

## 2023-01-27 MED ORDER — LOSARTAN POTASSIUM 50 MG PO TABS: 50.0000 mg | ORAL_TABLET | Freq: Every day | ORAL | 0 refills | Status: DC

## 2023-01-27 MED ORDER — AMIODARONE HCL 100 MG PO TABS: 100.0000 mg | ORAL_TABLET | Freq: Every day | ORAL | 0 refills | Status: DC

## 2023-02-12 ENCOUNTER — Other Ambulatory Visit: Payer: Self-pay

## 2023-02-12 ENCOUNTER — Emergency Department (HOSPITAL_COMMUNITY): Payer: Medicare Other

## 2023-02-12 ENCOUNTER — Emergency Department (HOSPITAL_COMMUNITY)
Admission: EM | Admit: 2023-02-12 | Discharge: 2023-02-12 | Disposition: A | Discharge status: Home / Self Care | Payer: Medicare Other | Attending: Emergency Medicine | Admitting: Emergency Medicine

## 2023-02-12 DIAGNOSIS — I129 Hypertensive chronic kidney disease with stage 1 through stage 4 chronic kidney disease, or unspecified chronic kidney disease: Secondary | ICD-10-CM | POA: Insufficient documentation

## 2023-02-12 DIAGNOSIS — Z79899 Other long term (current) drug therapy: Secondary | ICD-10-CM | POA: Insufficient documentation

## 2023-02-12 DIAGNOSIS — L239 Allergic contact dermatitis, unspecified cause: Secondary | ICD-10-CM | POA: Insufficient documentation

## 2023-02-12 DIAGNOSIS — J45909 Unspecified asthma, uncomplicated: Secondary | ICD-10-CM | POA: Insufficient documentation

## 2023-02-12 DIAGNOSIS — Z91040 Latex allergy status: Secondary | ICD-10-CM | POA: Diagnosis not present

## 2023-02-12 DIAGNOSIS — N1831 Chronic kidney disease, stage 3a: Secondary | ICD-10-CM | POA: Insufficient documentation

## 2023-02-12 DIAGNOSIS — R001 Bradycardia, unspecified: Secondary | ICD-10-CM | POA: Insufficient documentation

## 2023-02-12 DIAGNOSIS — R42 Dizziness and giddiness: Secondary | ICD-10-CM | POA: Insufficient documentation

## 2023-02-12 DIAGNOSIS — R21 Rash and other nonspecific skin eruption: Secondary | ICD-10-CM | POA: Diagnosis present

## 2023-02-12 LAB — COMPREHENSIVE METABOLIC PANEL
ALT: 14 U/L (ref 0–44)
AST: 16 U/L (ref 15–41)
Albumin: 3.7 g/dL (ref 3.5–5.0)
Alkaline Phosphatase: 81 U/L (ref 38–126)
Anion gap: 6 (ref 5–15)
BUN: 17 mg/dL (ref 8–23)
CO2: 25 mmol/L (ref 22–32)
Calcium: 8.6 mg/dL — ABNORMAL LOW (ref 8.9–10.3)
Chloride: 102 mmol/L (ref 98–111)
Creatinine, Ser: 0.78 mg/dL (ref 0.44–1.00)
GFR, Estimated: >60 mL/min (ref >60)
Glucose, Bld: 102 mg/dL — ABNORMAL HIGH (ref 70–99)
Potassium: 4 mmol/L (ref 3.5–5.1)
Sodium: 133 mmol/L — ABNORMAL LOW (ref 135–145)
Total Bilirubin: 0.7 mg/dL (ref 0.3–1.2)
Total Protein: 6.4 g/dL — ABNORMAL LOW (ref 6.5–8.1)

## 2023-02-12 LAB — CBC WITH DIFFERENTIAL/PLATELET
Abs Immature Granulocytes: 0.02 10*3/uL (ref 0.00–0.07)
Basophils Absolute: 0 10*3/uL (ref 0.0–0.1)
Basophils Relative: 1 %
Eosinophils Absolute: 0.1 10*3/uL (ref 0.0–0.5)
Eosinophils Relative: 2 %
HCT: 35.8 % — ABNORMAL LOW (ref 36.0–46.0)
Hemoglobin: 11.6 g/dL — ABNORMAL LOW (ref 12.0–15.0)
Immature Granulocytes: 0 %
Lymphocytes Relative: 16 %
Lymphs Abs: 0.8 10*3/uL (ref 0.7–4.0)
MCH: 29.5 pg (ref 26.0–34.0)
MCHC: 32.4 g/dL (ref 30.0–36.0)
MCV: 91.1 fL (ref 80.0–100.0)
Monocytes Absolute: 0.5 10*3/uL (ref 0.1–1.0)
Monocytes Relative: 9 %
Neutro Abs: 3.7 10*3/uL (ref 1.7–7.7)
Neutrophils Relative %: 72 %
Platelets: 201 10*3/uL (ref 150–400)
RBC: 3.93 MIL/uL (ref 3.87–5.11)
RDW: 14.8 % (ref 11.5–15.5)
WBC: 5.2 10*3/uL (ref 4.0–10.5)
nRBC: 0 % (ref 0.0–0.2)

## 2023-02-12 LAB — URINALYSIS, W/ REFLEX TO CULTURE (INFECTION SUSPECTED)
Bacteria, UA: NONE SEEN
Bilirubin Urine: NEGATIVE
Glucose, UA: NEGATIVE mg/dL
Hgb urine dipstick: NEGATIVE
Ketones, ur: NEGATIVE mg/dL
Leukocytes,Ua: NEGATIVE
Nitrite: NEGATIVE
Protein, ur: NEGATIVE mg/dL
Specific Gravity, Urine: 1.008 (ref 1.005–1.030)
pH: 7 (ref 5.0–8.0)

## 2023-02-12 MED ORDER — HYDROXYZINE HCL 25 MG PO TABS
12.5000 mg | ORAL_TABLET | Freq: Once | ORAL | Status: DC
  Filled 2023-02-12: qty 1

## 2023-02-12 MED ORDER — TRIAMCINOLONE ACETONIDE 0.1 % EX CREA: 1.0000 | TOPICAL_CREAM | Freq: Two times a day (BID) | CUTANEOUS | 0 refills | Status: DC

## 2023-02-12 NOTE — ED Triage Notes (Signed)
Pt arrived [facility] for rash, and HTN. Rash has been there x 1 week.  Pt took her bp meds today.

## 2023-02-12 NOTE — Discharge Instructions (Addendum)
We evaluated you for your rash and dizziness.  Your rash seems consistent with an allergic reaction.  We have prescribed you some steroid cream.  Please keep an eye out for anything that may have triggered this.  If you start having pain in your arm, the redness spreads, or you develop any fevers, please return to the emergency department.  We also evaluated you for your dizziness.  This seems related to low blood pressure when standing up.  Please be very careful when you stand up to avoid further falls.  Please follow-up closely with your cardiologist.  If you have any further falls, fainting episodes, or any other new symptoms please return to the emergency department.

## 2023-02-12 NOTE — ED Notes (Signed)
Pt's heart rate stayed at 60 during ambulation.

## 2023-02-12 NOTE — ED Provider Notes (Signed)
[place] EMERGENCY DEPARTMENT AT [facility] Provider Note  CSN: [id number] Arrival date & time: 02/12/23 1038  Chief Complaint(s) Hypertension and Rash  HPI Heidi Dorsey is a 89 y.o. female presenting for multiple complaints.  Patient first reports that she has had a rash on her right forearm for about a week.  She reports it is quite itchy and a little swollen.  She thinks that she may have been exposed to some sort of cleaning solution that was on a recliner arm at her apartment complex.  No fevers or chills.  She reports it is not painful.  Patient also reports that she has been having some near syncopal episodes.  She reports these always occur when she is standing up or bending over and always get better when she sits down.  She denies any chest pain, shortness of breath, palpitations, or any other new symptoms.  Further she reports that she had a fall a couple days ago and fell backwards and hit the back of her head.  She is unsure if she lost consciousness.  She denies any headaches, neck pain, back pain or any other symptoms from her fall.   Past Medical History Past Medical History:  Diagnosis Date   Asthma    BPPV (benign paroxysmal positional vertigo)    Claustrophobia    Diverticulosis    GERD (gastroesophageal reflux disease)    H/O degenerative disc disease    Hiatal hernia    Hyperlipidemia    Hypertension    Kidney stones    Migraines    Osteoarthritis    Caused feet deformity   Osteopenia    Prediabetes    Preglaucoma    Raynauds syndrome    Rectocele    Rosacea    Sciatica    Sleep apnea    Spinal stenosis    Vertebrobasilar artery syndrome    Patient Active Problem List   Diagnosis Date Noted   Sleep disturbance 01/21/2023   Unstable gait 01/21/2023   Bradycardia 11/06/2022   Atrial flutter with rapid ventricular response (HCC) 10/17/2022   Cognitive changes 10/17/2022   Hyponatremia 10/17/2022   PAF (paroxysmal atrial  fibrillation) (HCC) 09/26/2022   Nocturia more than twice per night 05/20/2022   Gait abnormality 02/24/2021   Left hand paresthesia 02/24/2021   Neck pain 02/24/2021   Xerosis cutis 09/15/2020   Senile cataract 09/15/2020   Rosacea 09/15/2020   Preglaucoma 09/15/2020   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/15/2020   History of hemorrhoids 09/15/2020   History of postmenopausal HRT 09/15/2020   Asthma, well controlled 09/15/2020   LBBB (left bundle branch block) 08/19/2020   Allergic rhinitis 05/14/2020   DDD (degenerative disc disease), lumbar 05/14/2020   Diverticulosis 05/14/2020   GERD (gastroesophageal reflux disease) 05/14/2020   Hypercholesterolemia 05/14/2020   Lumbar stenosis 05/14/2020   Migraine with aura and without status migrainosus, not intractable 05/14/2020   Osteoarthritis 05/14/2020   Osteopenia 05/14/2020   Primary insomnia 12/18/2019   Paresthesia and pain of left extremity 12/18/2019   Stage 3a chronic kidney disease (HCC) 08/23/2019   Left leg numbness 02/02/2018   Imbalance 02/02/2018   Dizziness 02/02/2018   Chronic pain of left knee 12/20/2017   Primary osteoarthritis of left knee 12/20/2017   Heart palpitations 02/16/2016   Raynaud disease 10/27/2015   Chest pain with high risk for cardiac etiology 06/11/2015   Numbness 02/07/2015   Essential hypertension 04/16/2014   Blurred vision, bilateral 04/16/2014   Hip pain 07/23/2013  Thoracic or lumbosacral neuritis or radiculitis, unspecified 07/23/2013   Cystitis, chronic 01/10/2013   Rectocele 11/04/2011   Vaginal atrophy 10/07/2011   BPPV (benign paroxysmal positional vertigo) 06/30/2011   Pre-diabetes 03/29/2011   Home Medication(s) Prior to Admission medications   Medication Sig Start Date End Date Taking? Authorizing Provider  triamcinolone cream (KENALOG) 0.1 % Apply 1 Application topically 2 (two) times daily. 02/12/23  Yes Lonell Grandchild, MD  acetaminophen (TYLENOL) 325 MG tablet  Take 2 tablets (650 mg total) by mouth every 6 (six) hours as needed for mild pain (or Fever >/= 101). 11/07/22   Emokpae, Courage, MD  amiodarone (PACERONE) 100 MG tablet Take 1 tablet (100 mg total) by mouth daily. 01/27/23   Sonny Masters, FNP  ASCORBIC ACID PO Take 1 tablet by mouth daily. OTC vitamin C    [provider]  BIOTIN PO Take 1 tablet by mouth daily.    [provider]  Cholecalciferol (VITAMIN D-3 PO) Take 1 capsule by mouth daily.    [provider]  cyanocobalamin (VITAMIN B12) 1000 MCG tablet Take 1,000 mcg by mouth daily.    [provider]  losartan (COZAAR) 50 MG tablet Take 1 tablet (50 mg total) by mouth daily. 01/27/23 02/26/23  Sonny Masters, FNP  Multiple Vitamins-Minerals (CENTRUM SILVER 50+WOMEN) TABS Take 1 tablet by mouth daily.    [provider]  Omega-3 Fatty Acids (FISH OIL PO) Take 1 capsule by mouth daily.    [provider]  sodium chloride 1 g tablet Take 1 tablet 5 days per week. 01/27/23   Sonny Masters, FNP                                                                                                                                    Past Surgical History Past Surgical History:  Procedure Laterality Date   ABDOMINAL HYSTERECTOMY  1978   ANTERIOR (CYSTOCELE) AND POSTERIOR REPAIR (RECTOCELE) WITH XENFORM GRAFT AND SACROSPINOUS FIXATION     BILATERAL OOPHORECTOMY  1997   bladder stones     kidney colic/ procedure performed in 1973 at [facility]   BLADDER SUSPENSION     BREAST BIOPSY Left 12/1987   neg   BREAST BIOPSY Right 02/1977   neg papilloma   CARDIAC CATHETERIZATION  2000   HERNIA REPAIR     plantar fibroma  Left 09/1997   Excised   Family History Family History  Problem Relation Age of Onset   Osteoporosis Mother    Ovarian cancer Mother    Uterine cancer Mother    Brain cancer Father    Bladder Cancer Brother    Rectal cancer Brother    Skin cancer Brother    Osteoporosis Sister     Diabetes Paternal Grandmother    Breast cancer Neg Hx     Social History Social History   Tobacco Use   Smoking status: Never  Passive exposure: Never   Smokeless tobacco: Never   Tobacco comments:    Verified by Grandson/Healthcare Power of Attorney, Chauncy Passy.  Vaping Use   Vaping status: Never Used  Substance Use Topics   Alcohol use: Yes    Alcohol/week: 2.0 standard drinks of alcohol    Types: 2 Glasses of wine per week    Comment: per week   Drug use: Never   Allergies Allegra [fexofenadine], Estrace [estradiol], Miacalcin [calcitonin], Nsaids, Benadryl [diphenhydramine], Atrovent hfa [ipratropium bromide hfa], Paraphenylenediamine, Augmentin [amoxicillin-pot clavulanate], Biaxin [clarithromycin], Cipro [ciprofloxacin hcl], Codeine, Epipen [epinephrine], Erythromycin, Flonase [fluticasone], Latex, Macrobid [nitrofurantoin], Other, Silenor [doxepin hcl], Sulfa antibiotics, Sumycin [tetracycline], and Zestril [lisinopril]  Review of Systems Review of Systems  All other systems reviewed and are negative.   Physical Exam Vital Signs  I have reviewed the triage vital signs BP (!) 161/64   Pulse (!) 43   Temp 97.7 F (36.5 C) (Oral)   Resp 16   Ht 5' (1.524 m)   Wt 61.2 kg   SpO2 95%   BMI 26.37 kg/m  Physical Exam Vitals and nursing note reviewed.  Constitutional:      General: She is not in acute distress.    Appearance: She is well-developed.  HENT:     Head: Normocephalic and atraumatic.     Mouth/Throat:     Mouth: Mucous membranes are moist.  Eyes:     Pupils: Pupils are equal, round, and reactive to light.  Cardiovascular:     Rate and Rhythm: Regular rhythm. Bradycardia present.     Heart sounds: No murmur heard. Pulmonary:     Effort: Pulmonary effort is normal. No respiratory distress.     Breath sounds: Normal breath sounds.  Abdominal:     General: Abdomen is flat.     Palpations: Abdomen is soft.     Tenderness: There is no  abdominal tenderness.  Musculoskeletal:        General: No tenderness.     Right lower leg: No edema.     Left lower leg: No edema.     Comments: No midline C, T, L-spine tenderness.  No chest wall tenderness or crepitus.  Full painless range of motion at the bilateral upper extremities including the shoulders, elbows, wrists, hand and fingers, and in the bilateral lower extremities including the hips, knees, ankle, toes.  No focal bony tenderness, injury or deformity.   Skin:    General: Skin is warm and dry.     Comments: Right forearm with erythematous rash over the volar forearm, mildly warm, edematous.  Absolutely no tenderness, fluctuance.  Patient frequently itching  Neurological:     General: No focal deficit present.     Mental Status: She is alert. Mental status is at baseline.  Psychiatric:        Mood and Affect: Mood normal.        Behavior: Behavior normal.     ED Results and Treatments Labs (all labs ordered are listed, but only abnormal results are displayed) Labs Reviewed  COMPREHENSIVE METABOLIC PANEL - Abnormal; Notable for the following components:      Result Value   Sodium 133 (*)    Glucose, Bld 102 (*)    Calcium 8.6 (*)    Total Protein 6.4 (*)    All other components within normal limits  CBC WITH DIFFERENTIAL/PLATELET - Abnormal; Notable for the following components:   Hemoglobin 11.6 (*)    HCT 35.8 (*)  All other components within normal limits  URINALYSIS, W/ REFLEX TO CULTURE (INFECTION SUSPECTED) - Abnormal; Notable for the following components:   Color, Urine STRAW (*)    All other components within normal limits                                                                                                                          Radiology CT Head Wo Contrast  Result Date: 02/12/2023 CLINICAL DATA:  Minor head trauma. EXAM: CT HEAD WITHOUT CONTRAST TECHNIQUE: Contiguous axial images were obtained from the base of the skull through the  vertex without intravenous contrast. RADIATION DOSE REDUCTION: This exam was performed according to the departmental dose-optimization program which includes automated exposure control, adjustment of the mA and/or kV according to patient size and/or use of iterative reconstruction technique. COMPARISON:  Brain MRI 12/02/2022 FINDINGS: Brain: No evidence of acute infarction, hemorrhage, hydrocephalus, extra-axial collection or mass lesion/mass effect. Generalized cerebral volume loss. Vascular: No hyperdense vessel or unexpected calcification. Skull: Normal. Negative for fracture or focal lesion. Sinuses/Orbits: No acute finding. IMPRESSION: Aging brain without acute or reversible finding. Electronically Signed   By: Tiburcio Pea M.D.   On: 02/12/2023 12:49    Pertinent labs & imaging results that were available during my care of the patient were reviewed by me and considered in my medical decision making (see MDM for details).  Medications Ordered in ED Medications  hydrOXYzine (ATARAX) tablet 12.5 mg (12.5 mg Oral Patient Refused/Not Given 02/12/23 1341)                                                                                                                                     Procedures Procedures  (including critical care time)  Medical Decision Making / ED Course   MDM:  89 year old presenting with multiple complaints.  She first reports this rash over her forearm.  It appears to be a type of contact dermatitis.  It is very sharply demarcated.  It is very itchy.  Is not painful or tender and quite soft, very low concern for cellulitis or abscess.  Will recommend some topical steroid cream for this.  Patient also reports some near syncopal episodes.  She reports these always occur on standing or leaning over.  Seems very consistent with orthostatic syncope.  She also sits down and her symptoms improved.  It appears she was evaluated previously in  the emergency department for this  in August.  She denies any chest pain or any other symptoms.  She also reports a fall couple of days ago.  She denies any pain or injuries from this fall.  Out of abundance of caution a head CT was obtained which is negative for any acute process.  Vitals notable for some bradycardia.  Appears to be sinus bradycardia with PVCs.  Is similar to previous.  Will ambulate patient.  Clinical Course as of 02/12/23 1549  Sat Feb 12, 2023  1545 Patient offered Atarax for her itching but she refused this.  Her labs are overall reassuring.  Her head CT shows no acute process.  Discussed presentation with her grandson who is her primary caregiver.  He is aware of everything.  He reports that she has frequently come to the emergency department with similar symptoms of dizziness and orthostatic lightheadedness.  This does not seem different than any of those previous episodes.  Discussed also patient's rash which I believe looks more like a contact dermatitis.  He reports that he sees the patient frequently and will keep an eye on it.  I have prescribed her a steroid cream for this.  She has been bradycardic while in the emergency department but this appears to be sinus bradycardia, on review she has had sinus bradycardia previously, she has been up and ambulatory to the bathroom at least 4 times while she has been here and has had no symptoms of lightheadedness or dizziness.  Doubt that this is related to her acute problem.  She does have follow-up with cardiology.  Will discharge patient to home. All questions answered. Patient comfortable with plan of discharge. Return precautions discussed with patient and specified on the after visit summary.  [WS]    Clinical Course User Index [WS] Suezanne Jacquet, Jerilee Field, MD     Additional history obtained: -Additional history obtained from family -External records from outside source obtained and reviewed including: Chart review including previous notes, labs, imaging,  consultation notes including prior ER visits    Lab Tests: -I ordered, reviewed, and interpreted labs.   The pertinent results include:   Labs Reviewed  COMPREHENSIVE METABOLIC PANEL - Abnormal; Notable for the following components:      Result Value   Sodium 133 (*)    Glucose, Bld 102 (*)    Calcium 8.6 (*)    Total Protein 6.4 (*)    All other components within normal limits  CBC WITH DIFFERENTIAL/PLATELET - Abnormal; Notable for the following components:   Hemoglobin 11.6 (*)    HCT 35.8 (*)    All other components within normal limits  URINALYSIS, W/ REFLEX TO CULTURE (INFECTION SUSPECTED) - Abnormal; Notable for the following components:   Color, Urine STRAW (*)    All other components within normal limits    Notable for mild hyponatremia, mild anemia, no UTI  EKG   EKG Interpretation Date/Time:  Saturday February 12 2023 12:28:13 EDT Ventricular Rate:  43 PR Interval:  196 QRS Duration:  151 QT Interval:  531 QTC Calculation: 450 R Axis:   120  Text Interpretation: Slow sinus arrhythmia Consider left ventricular hypertrophy Anterior Q waves, possibly due to LVH No significant change since last tracing Confirmed by Alvino Blood ([id number]) on 02/12/2023 1:27:12 PM         Imaging Studies ordered: I ordered imaging studies including CT head On my interpretation imaging demonstrates no intracranial process I independently visualized and interpreted imaging. I agree  with the radiologist interpretation   Medicines ordered and prescription drug management: Meds ordered this encounter  Medications   hydrOXYzine (ATARAX) tablet 12.5 mg   triamcinolone cream (KENALOG) 0.1 %    Sig: Apply 1 Application topically 2 (two) times daily.    Dispense:  30 g    Refill:  0    -I have reviewed the patients home medicines and have made adjustments as needed   Cardiac Monitoring: The patient was maintained on a cardiac monitor.  I personally viewed and interpreted the  cardiac monitored which showed an underlying rhythm of: sinus bradycardia   Social Determinants of Health:  Diagnosis or treatment significantly limited by social determinants of health: lives alone  Co morbidities that complicate the patient evaluation  Past Medical History:  Diagnosis Date   Asthma    BPPV (benign paroxysmal positional vertigo)    Claustrophobia    Diverticulosis    GERD (gastroesophageal reflux disease)    H/O degenerative disc disease    Hiatal hernia    Hyperlipidemia    Hypertension    Kidney stones    Migraines    Osteoarthritis    Caused feet deformity   Osteopenia    Prediabetes    Preglaucoma    Raynauds syndrome    Rectocele    Rosacea    Sciatica    Sleep apnea    Spinal stenosis    Vertebrobasilar artery syndrome       Dispostion: Disposition decision including need for hospitalization was considered, and patient discharged from emergency department.    Final Clinical Impression(s) / ED Diagnoses Final diagnoses:  Allergic contact dermatitis, unspecified trigger  Orthostatic dizziness     This chart was dictated using voice recognition software.  Despite best efforts to proofread,  errors can occur which can change the documentation meaning.    Lonell Grandchild, MD 02/12/23 [phone]

## 2023-02-17 ENCOUNTER — Institutional Professional Consult (permissible substitution): Payer: Medicare Other | Admitting: Neurology

## 2023-02-27 DIAGNOSIS — R011 Cardiac murmur, unspecified: Secondary | ICD-10-CM | POA: Insufficient documentation

## 2023-02-27 NOTE — Progress Notes (Unsigned)
Cardiology Office Note:   Date:  03/02/2023  ID:  Heidi Dorsey, Heidi Dorsey 21-Jun-1930, MRN [id number] PCP: Sonny Masters, FNP  [facility] Providers Cardiologist:  Rollene Rotunda, MD {  History of Present Illness:   Heidi Dorsey is a 89 y.o. female for evaluation of palpitations.    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  She had a long history of left bundle branch block.  She has had a cardiac catheterization in 2000.  She does not remember the results but was not told that she had vascular disease.  I see that she has had carotid Dopplers which were unremarkable.  She had a very mildly reduced ejection fraction on the stress echo in 2017 with her EF being 50%.  She has had a 24-hour Holter.  She previously has been treated with metoprolol.  Most recently she was on Cozaar 12.5 mg daily but this was discontinued because her blood pressures were running low.   She also called back and she was had an eye bleed on ARB so she did not want to start this.  She has also been intolerant of amlodipine.  She cannot take ACE inhibitors.  She seemed to be tolerating beta blocker so at the last visit I was prescribing this.  I had suggested hydralazine PRN SBP greater than 160.  She called back because she did not want to take the hydralazine because she read it would worsen her asthma.    When we spoke to her in June she was taking Losartan in the AM and metoprolol in the evening.  She was back to taking hydralazine but because a pharmacist told her it did not make sense to take it PRN she was taking it daily.  She did not want to be seen in the HTN clinic.  I tried to schedule her to see me in [place] as there were no appts in [place] and she would not do this.  Her blood pressure has been labile.    She was found to have PAF recently.  Since I last saw her she called because of swelling on the side of her nose and asked [place] to refer her for evaluation and  treatment.   Since she was seen she was in the ED with multiple complaints including a rash, fall, and possible syncope.  I reviewed these records for this visit.    She was noted to have bradycardia but no sustained or symptomatic pauses. She had a negative head CT.    She is here today and mostly complains of balance and some numbness in her left hand.  She is going to be moving to a memories disorders unit at [facility]    ROS: As stated in the HPI and negative for all other systems.  Studies Reviewed:    EKG:   NA  Risk Assessment/Calculations:    CHA2DS2-VASc Score = 4   This indicates a 4.8% annual risk of stroke. The patient's score is based upon: CHF History: 0 HTN History: 1 Diabetes History: 0 Stroke History: 0 Vascular Disease History: 0 Age Score: 2 Gender Score: 1       Physical Exam:   VS:  BP (!) 140/78   Pulse 60   Ht 4\' 11"  (1.499 m)   Wt 130 lb (59 kg)   BMI 26.26 kg/m    Wt Readings from Last 3 Encounters:  03/02/23 130 lb (59 kg)  02/12/23 135 lb (  61.2 kg)  01/21/23 129 lb 12.8 oz (58.9 kg)     GEN: Well nourished, well developed in no acute distress NECK: No JVD; No carotid bruits CARDIAC: RRR, no murmurs, rubs, gallops RESPIRATORY:  Clear to auscultation without rales, wheezing or rhonchi  ABDOMEN: Soft, non-tender, non-distended EXTREMITIES:  No edema; No deformity   ASSESSMENT AND PLAN:   ATRIAL FIB:   She seems to be maintaining sinus rhythm.  She has not wanted to consider watchman in the past.  She is high risk for anticoagulation.  No change in therapy.  Of note she has been noted to have bradycardia but currently she is having any symptomatic bradycardia arrhythmias.  She is up-to-date with blood work for her amiodarone.    HTN:     The blood pressure is at target.  I discussed with her grandson the fact that some of her labile blood pressures are probably related to not remembering to take medications.    MURMUR:   She has a systolic  murmur that is probably aortic sclerosis versus mild stenosis but she has not wanted an echocardiogram because of transport.  She would likely only want conservative management anyway.      Follow up with me in one year.   Signed, Rollene Rotunda, MD

## 2023-02-28 ENCOUNTER — Telehealth: Payer: Self-pay | Admitting: Family Medicine

## 2023-02-28 NOTE — Telephone Encounter (Signed)
Appointment scheduled.

## 2023-02-28 NOTE — Telephone Encounter (Signed)
Copied from CRM [id number]. Topic: Appointments - Appointment Scheduling >> Feb 28, 2023 12:13 PM Almira Coaster wrote: Patient/patient representative is calling to schedule an appointment. Refer to attachments for appointment information. Patient's daughter called to schedule a TB vaccine for Wednesday around 1:30 pm - 2:00 pm since the patient will be seeing her Cardiologist across the street from the office I informed her we did not have something for that date and time frame but they insist because it's hard to travel back and forth with the patient. Patient turns 89 on Monday and would appreciate it. Daughters call back is [phone].

## 2023-03-02 ENCOUNTER — Encounter: Payer: Self-pay | Admitting: Cardiology

## 2023-03-02 ENCOUNTER — Ambulatory Visit (INDEPENDENT_AMBULATORY_CARE_PROVIDER_SITE_OTHER): Payer: Medicare Other | Admitting: *Deleted

## 2023-03-02 ENCOUNTER — Ambulatory Visit (INDEPENDENT_AMBULATORY_CARE_PROVIDER_SITE_OTHER): Payer: Commercial Managed Care - PPO | Admitting: Cardiology

## 2023-03-02 VITALS — BP 140/78 | HR 60 | Ht 59.0 in | Wt 130.0 lb

## 2023-03-02 DIAGNOSIS — R011 Cardiac murmur, unspecified: Secondary | ICD-10-CM

## 2023-03-02 DIAGNOSIS — I480 Paroxysmal atrial fibrillation: Secondary | ICD-10-CM

## 2023-03-02 DIAGNOSIS — Z111 Encounter for screening for respiratory tuberculosis: Secondary | ICD-10-CM | POA: Diagnosis not present

## 2023-03-02 DIAGNOSIS — I10 Essential (primary) hypertension: Secondary | ICD-10-CM

## 2023-03-02 DIAGNOSIS — Z23 Encounter for immunization: Secondary | ICD-10-CM

## 2023-03-02 NOTE — Progress Notes (Signed)
PPD placed on left forearm for nursing home placement.

## 2023-03-02 NOTE — Patient Instructions (Signed)

## 2023-03-04 ENCOUNTER — Ambulatory Visit: Payer: Medicare Other

## 2023-03-04 DIAGNOSIS — Z111 Encounter for screening for respiratory tuberculosis: Secondary | ICD-10-CM

## 2023-03-04 LAB — TB SKIN TEST
Induration: 0 mm
TB Skin Test: NEGATIVE

## 2023-03-04 NOTE — Progress Notes (Signed)
Patient came in for PPD skin test reading - results are 0mm (negative)

## 2023-03-08 ENCOUNTER — Ambulatory Visit: Payer: Medicare Other

## 2023-03-14 ENCOUNTER — Other Ambulatory Visit: Payer: Self-pay | Admitting: Family Medicine

## 2023-03-14 DIAGNOSIS — E871 Hypo-osmolality and hyponatremia: Secondary | ICD-10-CM

## 2023-04-07 ENCOUNTER — Other Ambulatory Visit: Payer: Self-pay | Admitting: Family Medicine

## 2023-04-12 ENCOUNTER — Telehealth: Payer: Self-pay | Admitting: Family Medicine

## 2023-04-12 NOTE — Telephone Encounter (Signed)
 Copied from CRM [id number]. Topic: Referral - Request for Referral >> Apr 11, 2023  4:17 PM Heidi Dorsey wrote: Did the patient discuss referral with their provider in the last year? N/A  (If No - schedule appointment) (If Yes - send message)  Appointment offered? N/A  Type of order/referral and detailed reason for visit:  stevens Tonie who is the power of attorney called need Heidi Heidi Dorsey to resign the  the [facility] form to get patient [facility]  last time was sign Heidi Dorsey have patient cognitve has decline ,however they want to see on the form that patient has Dementia if comfortable putting that diagnosis , the previous FL2 form has expired    Preference of office, provider, location: [facility]   If referral order, have you been seen by this specialty before? N/a (If Yes, this issue or another issue? When? Where?  Can we respond through MyChart? Yes

## 2023-04-12 NOTE — Telephone Encounter (Unsigned)
 Copied from CRM [id number]. Topic: Appointments - Scheduling Inquiry for Clinic >> Apr 12, 2023 12:23 PM Ivette P wrote: Reason for CRM: Patient needs to be seen before end of this week to fill out paperwork for medicare, needs to be re diagnosed for dementia. Heidi Dorsey requesting call back [phone].

## 2023-04-12 NOTE — Telephone Encounter (Signed)
LMOVM pt would need to be seen in order to determine if PCP can put Dx of dementia on FL2 for since last OV was in October, please call office back to schedule.

## 2023-04-14 ENCOUNTER — Telehealth: Payer: Self-pay | Admitting: Family Medicine

## 2023-04-14 NOTE — Telephone Encounter (Signed)
 This has been taken care of.

## 2023-04-14 NOTE — Telephone Encounter (Signed)
 Copied from CRM [id number]. Topic: Appointments - Scheduling Inquiry for Clinic >> Apr 12, 2023 12:23 PM Ivette P wrote: Reason for CRM: Patient needs to be seen before end of this week to fill out paperwork for medicare, needs to be re diagnosed for dementia. Blaize requesting call back [phone].

## 2023-04-14 NOTE — Telephone Encounter (Signed)
 Patient's grand daughter in law came by this morning and brought FL2 form which listed dementia as a diagnosis.  This was signed by PCP and given back to her.

## 2023-04-18 ENCOUNTER — Ambulatory Visit: Payer: Medicare Other | Admitting: Nurse Practitioner

## 2023-04-21 ENCOUNTER — Encounter: Payer: Self-pay | Admitting: Family Medicine

## 2023-04-21 ENCOUNTER — Ambulatory Visit (INDEPENDENT_AMBULATORY_CARE_PROVIDER_SITE_OTHER): Payer: Medicare Other | Admitting: Family Medicine

## 2023-04-21 VITALS — BP 205/73 | HR 56 | Temp 96.1°F | Ht 59.0 in | Wt 127.8 lb

## 2023-04-21 DIAGNOSIS — E559 Vitamin D deficiency, unspecified: Secondary | ICD-10-CM

## 2023-04-21 DIAGNOSIS — E871 Hypo-osmolality and hyponatremia: Secondary | ICD-10-CM | POA: Diagnosis not present

## 2023-04-21 DIAGNOSIS — R202 Paresthesia of skin: Secondary | ICD-10-CM

## 2023-04-21 DIAGNOSIS — R4189 Other symptoms and signs involving cognitive functions and awareness: Secondary | ICD-10-CM | POA: Diagnosis not present

## 2023-04-21 DIAGNOSIS — R3915 Urgency of urination: Secondary | ICD-10-CM

## 2023-04-21 DIAGNOSIS — I10 Essential (primary) hypertension: Secondary | ICD-10-CM

## 2023-04-21 DIAGNOSIS — I129 Hypertensive chronic kidney disease with stage 1 through stage 4 chronic kidney disease, or unspecified chronic kidney disease: Secondary | ICD-10-CM

## 2023-04-21 DIAGNOSIS — N1831 Chronic kidney disease, stage 3a: Secondary | ICD-10-CM

## 2023-04-21 DIAGNOSIS — I4892 Unspecified atrial flutter: Secondary | ICD-10-CM

## 2023-04-21 LAB — URINALYSIS, ROUTINE W REFLEX MICROSCOPIC
Bilirubin, UA: NEGATIVE
Glucose, UA: NEGATIVE
Ketones, UA: NEGATIVE
Leukocytes,UA: NEGATIVE
Nitrite, UA: NEGATIVE
Protein,UA: NEGATIVE
RBC, UA: NEGATIVE
Specific Gravity, UA: 1.015 (ref 1.005–1.030)
Urobilinogen, Ur: 0.2 mg/dL (ref 0.2–1.0)
pH, UA: 6 (ref 5.0–7.5)

## 2023-04-21 NOTE — Progress Notes (Signed)
 Subjective:  Patient ID: Heidi Dorsey, female    DOB: 1930-09-19, 89 y.o.   MRN: [id number]  Patient Care Team: Severa Rock HERO, FNP as PCP - General (Family Medicine) Lavona Agent, MD as PCP - Cardiology (Cardiology) Tobie Tonita POUR, DO as Consulting Physician (Neurology) Ramonita Suzen CROME, RN as [facility] Care Management   Chief Complaint:  Medical Management of Chronic Issues (3 month follow up )   HPI: Heidi Dorsey is a 89 y.o. female presenting on 04/21/2023 for Medical Management of Chronic Issues (3 month follow up )   Discussed the use of AI scribe software for clinical note transcription with the patient, who gave verbal consent to proceed.  History of Present Illness   The patient, a 89 year old with a history of hypertension and neuropathy, has recently been transferred to [facility]. She expresses dissatisfaction with the facility, describing it as 'primitive' and 'stressful.' She suspects that the stress of the new environment may be contributing to an unusually high blood pressure reading, despite reportedly receiving her prescribed losartan  and amiodarone  daily.  The patient also reports worsening neuropathy, particularly in her left hand, which has become so numb that she struggles with tasks requiring fine motor skills, such as turning a page or distinguishing between objects by touch. She also reports a persistent sensation of needing to urinate, often waking up three to four times a night, but producing little to no urine. She suspects this could be due to a urinary tract infection or spinal degeneration impacting bladder nerves.  The patient also mentions a decrease in appetite since moving to the facility, often substituting meals with protein bars. She expresses a desire to return to her previous living situation where she had more control over her diet and environment.          Relevant past medical, surgical, family, and  social history reviewed and updated as indicated.  Allergies and medications reviewed and updated. Data reviewed: Chart in Epic.   Past Medical History:  Diagnosis Date   Asthma    BPPV (benign paroxysmal positional vertigo)    Claustrophobia    Diverticulosis    GERD (gastroesophageal reflux disease)    H/O degenerative disc disease    Hiatal hernia    Hyperlipidemia    Hypertension    Kidney stones    Migraines    Osteoarthritis    Caused feet deformity   Osteopenia    Prediabetes    Preglaucoma    Raynauds syndrome    Rectocele    Rosacea    Sciatica    Sleep apnea    Spinal stenosis    Vertebrobasilar artery syndrome     Past Surgical History:  Procedure Laterality Date   ABDOMINAL HYSTERECTOMY  1978   ANTERIOR (CYSTOCELE) AND POSTERIOR REPAIR (RECTOCELE) WITH XENFORM GRAFT AND SACROSPINOUS FIXATION     BILATERAL OOPHORECTOMY  1997   bladder stones     kidney colic/ procedure performed in 1973 at [facility]   BLADDER SUSPENSION     BREAST BIOPSY Left 12/1987   neg   BREAST BIOPSY Right 02/1977   neg papilloma   CARDIAC CATHETERIZATION  2000   HERNIA REPAIR     plantar fibroma  Left 09/1997   Excised    Social History   Socioeconomic History   Marital status: Single    Spouse name: Not on file   Number of children: 2   Years of education: 12   Highest  education level: 12th grade  Occupational History   Occupation: Retired    Comment: Scientist, Product/process Development  Tobacco Use   Smoking status: Never    Passive exposure: Never   Smokeless tobacco: Never   Tobacco comments:    Verified by Grandson/Healthcare Power of Attorney, Tonie Gravely.  Vaping Use   Vaping status: Never Used  Substance and Sexual Activity   Alcohol use: Yes    Alcohol/week: 2.0 standard drinks of alcohol    Types: 2 Glasses of wine per week    Comment: per week   Drug use: Never   Sexual activity: Not Currently    Partners: Male    Birth control/protection: Surgical  Other  Topics Concern   Not on file  Social History Narrative   Right Handed   Lives in an apartment complex and her apartment is on ground level.    Drinks Half [employer] Coffee and Tea   Originally from [place], then lived in [place] most of her life - moved here 09/2019 - feels out of place, having trouble finding others with similar interests   She is agnostic - not religious (although raised Catholic) - strong scientific beliefs    Social Drivers of Corporate Investment Banker Strain: Low Risk  (12/19/2022)   Overall Financial Resource Strain (CARDIA)    Difficulty of Paying Living Expenses: Not hard at all  Food Insecurity: No Food Insecurity (12/19/2022)   Hunger Vital Sign    Worried About Running Out of Food in the Last Year: Never true    Ran Out of Food in the Last Year: Never true  Transportation Needs: No Transportation Needs (12/19/2022)   PRAPARE - Administrator, Civil Service (Medical): No    Lack of Transportation (Non-Medical): No  Physical Activity: Inactive (12/19/2022)   Exercise Vital Sign    Days of Exercise per Week: 0 days    Minutes of Exercise per Session: 0 min  Stress: No Stress Concern Present (12/19/2022)   [employer] of Occupational Health - Occupational Stress Questionnaire    Feeling of Stress : Not at all  Social Connections: Moderately Isolated (12/19/2022)   Social Connection and Isolation Panel [NHANES]    Frequency of Communication with Friends and Family: More than three times a week    Frequency of Social Gatherings with Friends and Family: More than three times a week    Attends Religious Services: More than 4 times per year    Active Member of [employer] or Organizations: No    Attends Banker Meetings: Never    Marital Status: Divorced  Catering Manager Violence: Not At Risk (12/19/2022)   Humiliation, Afraid, Rape, and Kick questionnaire    Fear of Current or Ex-Partner: No    Emotionally Abused: No    Physically Abused: No     Sexually Abused: No    Outpatient Encounter Medications as of 04/21/2023  Medication Sig   amiodarone  (PACERONE ) 100 MG tablet Take 1 tablet (100 mg total) by mouth daily.   losartan  (COZAAR ) 50 MG tablet TAKE ONE TABLET DAILY   sodium chloride  1 g tablet TAKE ONE TABLET FIVE DAYS A WEEK (Patient not taking: Reported on 04/21/2023)   [DISCONTINUED] acetaminophen  (TYLENOL ) 325 MG tablet Take 2 tablets (650 mg total) by mouth every 6 (six) hours as needed for mild pain (or Fever >/= 101).   [DISCONTINUED] ASCORBIC ACID PO Take 1 tablet by mouth daily. OTC vitamin C   [DISCONTINUED] BIOTIN  PO Take 1 tablet by mouth daily.   [DISCONTINUED] cyanocobalamin (VITAMIN B12) 1000 MCG tablet Take 1,000 mcg by mouth daily.   [DISCONTINUED] Multiple Vitamins-Minerals (CENTRUM SILVER 50+WOMEN) TABS Take 1 tablet by mouth daily.   [DISCONTINUED] Omega-3 Fatty Acids (FISH OIL PO) Take 1 capsule by mouth daily.   No facility-administered encounter medications on file as of 04/21/2023.    Allergies  Allergen Reactions   Allegra [Fexofenadine] Other (See Comments)    Syncope   Estrace [Estradiol] Other (See Comments)    Headache   Miacalcin [Calcitonin] Palpitations    Left bundle branch block   Nsaids Other (See Comments)    Stomach ulcer after taking for 3 days in 2009. Negative H. Pylori test.   Benadryl [Diphenhydramine] Other (See Comments)    Syncope   Atrovent Hfa [Ipratropium Bromide Hfa] Other (See Comments)    Syncope    Paraphenylenediamine Other (See Comments)    Unknown reaction   Augmentin [Amoxicillin -Pot Clavulanate] Rash    OK to take plain amoxicillin    Biaxin [Clarithromycin] Rash    OK to take azithromycin   Cipro [Ciprofloxacin Hcl] Rash   Codeine Other (See Comments)    Unknown reaction   Epipen [Epinephrine] Other (See Comments)    Unknown reaction   Erythromycin Other (See Comments)    Stomach irritation   Flonase [Fluticasone] Other (See Comments)    Unknown  reaction Similar reactions to corticoid steroids   Latex Rash   Macrobid [Nitrofurantoin] Other (See Comments)    Multiple mild symptoms - would prefer not to take again   Other Other (See Comments)    Propanol amine Anti-cholinergic compounds   Silenor  [Doxepin  Hcl] Other (See Comments)    Heart arrhythmia   Sulfa Antibiotics Other (See Comments)    Unknown reaction   Sumycin [Tetracycline] Other (See Comments)    Mouth sores OK to take doxycycline   Zestril [Lisinopril] Other (See Comments)    Bruising Cankers Arthralgias    Pertinent ROS per HPI, otherwise unremarkable      Objective:  BP (!) 205/73   Pulse (!) 56   Temp (!) 96.1 F (35.6 C)   Ht 4' 11 (1.499 m)   Wt 127 lb 12.8 oz (58 kg)   SpO2 98%   BMI 25.81 kg/m    Wt Readings from Last 3 Encounters:  04/21/23 127 lb 12.8 oz (58 kg)  03/02/23 130 lb (59 kg)  02/12/23 135 lb (61.2 kg)    Physical Exam Vitals and nursing note reviewed.  Constitutional:      General: She is not in acute distress.    Appearance: She is normal weight. She is not ill-appearing, toxic-appearing or diaphoretic.  HENT:     Head: Normocephalic and atraumatic.     Nose: Nose normal.     Mouth/Throat:     Mouth: Mucous membranes are moist.  Eyes:     Conjunctiva/sclera: Conjunctivae normal.     Pupils: Pupils are equal, round, and reactive to light.  Cardiovascular:     Rate and Rhythm: Normal rate. Rhythm irregularly irregular.     Heart sounds: Normal heart sounds.  Pulmonary:     Effort: Pulmonary effort is normal.     Breath sounds: Normal breath sounds.  Musculoskeletal:     Cervical back: Neck supple.     Right lower leg: No edema.     Left lower leg: No edema.  Skin:    General: Skin is warm and dry.  Capillary Refill: Capillary refill takes less than 2 seconds.  Neurological:     General: No focal deficit present.     Mental Status: She is alert and oriented to person, place, and time.  Psychiatric:         Attention and Perception: Attention normal.        Mood and Affect: Affect is angry.        Speech: Speech normal.        Behavior: Behavior is agitated.        Cognition and Memory: Cognition is impaired. Memory is impaired.      Results for orders placed or performed in visit on 03/02/23  PPD   Collection Time: 03/04/23  2:46 PM  Result Value Ref Range   TB Skin Test Negative    Induration 0 mm       Pertinent labs & imaging results that were available during my care of the patient were reviewed by me and considered in my medical decision making.  Assessment & Plan:  Shaneta was seen today for medical management of chronic issues.  Diagnoses and all orders for this visit:  Essential hypertension -     CBC with Differential/Platelet -     CMP14+EGFR -     Thyroid  Panel With TSH -     Lipid panel  Stage 3a chronic kidney disease (HCC) -     CMP14+EGFR  Hyponatremia -     CMP14+EGFR  Cognitive changes -     CBC with Differential/Platelet -     CMP14+EGFR -     Thyroid  Panel With TSH -     Urine Culture -     VITAMIN D  25 Hydroxy (Vit-D Deficiency, Fractures) -     Vitamin B12 -     Urinalysis, Routine w reflex microscopic  Paresthesias -     CBC with Differential/Platelet -     CMP14+EGFR -     Thyroid  Panel With TSH -     VITAMIN D  25 Hydroxy (Vit-D Deficiency, Fractures) -     Vitamin B12  Vitamin D  deficiency -     VITAMIN D  25 Hydroxy (Vit-D Deficiency, Fractures)  Urgency of micturition -     Urine Culture -     Urinalysis, Routine w reflex microscopic  Atrial flutter with rapid ventricular response (HCC) -     CBC with Differential/Platelet -     CMP14+EGFR -     Thyroid  Panel With TSH -     Lipid panel     Assessment and Plan    Hypertension Blood pressure is elevated today. Reports stress from current living situation at [facility] and concerns about medication administration accuracy. Currently prescribed losartan  and amiodarone , but  unsure if receiving the correct medications. Discussed the importance of accurate medication administration and potential impact of stress on blood pressure. - Monitor blood pressure regularly - Review medication administration process at [facility]  Peripheral Neuropathy Worsening neuropathy, particularly in the left hand, with significant numbness affecting daily activities. Discussed checking vitamin levels and potential benefits of B complex vitamin and magnesium supplementation. - Check vitamin B12, sodium, and vitamin D  levels - Consider B complex vitamin and magnesium supplementation  Urinary Symptoms Reports urinary urgency and frequent nocturnal awakenings with little to no urine output. Differential diagnosis includes urinary tract infection or spinal issues affecting bladder nerves. Discussed the need for urinalysis to rule out infection. - Order urinalysis  Nutritional Concerns Reports poor appetite and dissatisfaction with  food at [facility], relying on protein bars. Discussed the importance of adequate nutrition and potential use of nutritional supplements like Boost. - Check protein levels - Consider nutritional supplements like Boost  General Health Maintenance Routine health maintenance checks. Discussed the need for comprehensive blood work to monitor overall health. - Order comprehensive blood work  Follow-up - Follow up after lab results are available.          Continue all other maintenance medications.  Follow up plan: Return in about 6 months (around 10/19/2023), or if symptoms worsen or fail to improve.   Continue healthy lifestyle choices, including diet (rich in fruits, vegetables, and lean proteins, and low in salt and simple carbohydrates) and exercise (at least 30 minutes of moderate physical activity daily).    The above assessment and management plan was discussed with the patient. The patient verbalized understanding of and has agreed to the  management plan. Patient is aware to call the clinic if they develop any new symptoms or if symptoms persist or worsen. Patient is aware when to return to the clinic for a follow-up visit. Patient educated on when it is appropriate to go to the emergency department.   Rosaline Bruns, FNP-C [facility] [phone]

## 2023-04-22 LAB — CBC WITH DIFFERENTIAL/PLATELET
Basophils Absolute: 0.1 10*3/uL (ref 0.0–0.2)
Basos: 1 %
EOS (ABSOLUTE): 0 10*3/uL (ref 0.0–0.4)
Eos: 1 %
Hematocrit: 38.1 % (ref 34.0–46.6)
Hemoglobin: 12.4 g/dL (ref 11.1–15.9)
Immature Grans (Abs): 0 10*3/uL (ref 0.0–0.1)
Immature Granulocytes: 0 %
Lymphocytes Absolute: 0.7 10*3/uL (ref 0.7–3.1)
Lymphs: 13 %
MCH: 28.8 pg (ref 26.6–33.0)
MCHC: 32.5 g/dL (ref 31.5–35.7)
MCV: 89 fL (ref 79–97)
Monocytes Absolute: 0.5 10*3/uL (ref 0.1–0.9)
Monocytes: 9 %
Neutrophils Absolute: 4.1 10*3/uL (ref 1.4–7.0)
Neutrophils: 76 %
Platelets: 188 10*3/uL (ref 150–450)
RBC: 4.3 x10E6/uL (ref 3.77–5.28)
RDW: 13.9 % (ref 11.7–15.4)
WBC: 5.4 10*3/uL (ref 3.4–10.8)

## 2023-04-22 LAB — CMP14+EGFR
ALT: 18 [IU]/L (ref 0–32)
AST: 25 [IU]/L (ref 0–40)
Albumin: 4.3 g/dL (ref 3.6–4.6)
Alkaline Phosphatase: 91 [IU]/L (ref 44–121)
BUN/Creatinine Ratio: 18 (ref 12–28)
BUN: 18 mg/dL (ref 10–36)
Bilirubin Total: 0.5 mg/dL (ref 0.0–1.2)
CO2: 21 mmol/L (ref 20–29)
Calcium: 9.2 mg/dL (ref 8.7–10.3)
Chloride: 100 mmol/L (ref 96–106)
Creatinine, Ser: 1.01 mg/dL — ABNORMAL HIGH (ref 0.57–1.00)
Globulin, Total: 1.9 g/dL (ref 1.5–4.5)
Glucose: 92 mg/dL (ref 70–99)
Potassium: 4.3 mmol/L (ref 3.5–5.2)
Sodium: 139 mmol/L (ref 134–144)
Total Protein: 6.2 g/dL (ref 6.0–8.5)
eGFR: 53 mL/min/{1.73_m2} — ABNORMAL LOW (ref >59)

## 2023-04-22 LAB — LIPID PANEL
Chol/HDL Ratio: 2.1 {ratio} (ref 0.0–4.4)
Cholesterol, Total: 215 mg/dL — ABNORMAL HIGH (ref 100–199)
HDL: 101 mg/dL (ref >39)
LDL Chol Calc (NIH): 100 mg/dL — ABNORMAL HIGH (ref 0–99)
Triglycerides: 80 mg/dL (ref 0–149)
VLDL Cholesterol Cal: 14 mg/dL (ref 5–40)

## 2023-04-22 LAB — VITAMIN D 25 HYDROXY (VIT D DEFICIENCY, FRACTURES): Vit D, 25-Hydroxy: 40.6 ng/mL (ref 30.0–100.0)

## 2023-04-22 LAB — THYROID PANEL WITH TSH
Free Thyroxine Index: 3.5 (ref 1.2–4.9)
T3 Uptake Ratio: 34 % (ref 24–39)
T4, Total: 10.3 ug/dL (ref 4.5–12.0)
TSH: 3.8 u[IU]/mL (ref 0.450–4.500)

## 2023-04-22 LAB — VITAMIN B12: Vitamin B-12: 1372 pg/mL — ABNORMAL HIGH (ref 232–1245)

## 2023-04-24 LAB — URINE CULTURE: Organism ID, Bacteria: NO GROWTH

## 2023-05-02 ENCOUNTER — Other Ambulatory Visit: Payer: Self-pay | Admitting: Family Medicine

## 2023-05-02 DIAGNOSIS — E871 Hypo-osmolality and hyponatremia: Secondary | ICD-10-CM

## 2023-06-13 ENCOUNTER — Other Ambulatory Visit: Payer: Self-pay | Admitting: Family Medicine

## 2023-06-19 ENCOUNTER — Emergency Department (HOSPITAL_COMMUNITY)

## 2023-06-19 ENCOUNTER — Emergency Department (HOSPITAL_COMMUNITY)
Admission: EM | Admit: 2023-06-19 | Discharge: 2023-06-20 | Disposition: A | Discharge status: Skilled Nursing Facility | Attending: Emergency Medicine | Admitting: Emergency Medicine

## 2023-06-19 ENCOUNTER — Other Ambulatory Visit: Payer: Self-pay

## 2023-06-19 ENCOUNTER — Encounter (HOSPITAL_COMMUNITY): Payer: Self-pay

## 2023-06-19 DIAGNOSIS — Z91040 Latex allergy status: Secondary | ICD-10-CM | POA: Diagnosis not present

## 2023-06-19 DIAGNOSIS — Z79899 Other long term (current) drug therapy: Secondary | ICD-10-CM | POA: Diagnosis not present

## 2023-06-19 DIAGNOSIS — J111 Influenza due to unidentified influenza virus with other respiratory manifestations: Secondary | ICD-10-CM | POA: Diagnosis not present

## 2023-06-19 DIAGNOSIS — I10 Essential (primary) hypertension: Secondary | ICD-10-CM | POA: Diagnosis not present

## 2023-06-19 DIAGNOSIS — R059 Cough, unspecified: Secondary | ICD-10-CM | POA: Diagnosis present

## 2023-06-19 LAB — RESP PANEL BY RT-PCR (RSV, FLU A&B, COVID)  RVPGX2
Influenza A by PCR: POSITIVE — AB
Influenza B by PCR: NEGATIVE
Resp Syncytial Virus by PCR: NEGATIVE
SARS Coronavirus 2 by RT PCR: NEGATIVE

## 2023-06-19 MED ORDER — OSELTAMIVIR PHOSPHATE 75 MG PO CAPS: 75.0000 mg | ORAL_CAPSULE | Freq: Two times a day (BID) | ORAL | 0 refills | Status: DC

## 2023-06-19 MED ORDER — OSELTAMIVIR PHOSPHATE 75 MG PO CAPS
75.0000 mg | ORAL_CAPSULE | Freq: Once | ORAL | Status: AC
  Administered 2023-06-19: 75 mg via ORAL
  Filled 2023-06-19: qty 1

## 2023-06-19 NOTE — ED Notes (Signed)
Notified [facility] of patient needing transportation back to [facility] of [address].

## 2023-06-19 NOTE — ED Notes (Signed)
 Patient transported to X-ray

## 2023-06-19 NOTE — ED Triage Notes (Signed)
 Per EMS, They were told by nurse at [facility] that flu has been going throughout home. Pt been c/o sore throat and flu symptoms. Pt in ED c/o post nasal drip, cough. 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 Says its been going on for years. Hx of dementia. CBG 199 per EMS.

## 2023-06-19 NOTE — Discharge Instructions (Addendum)
 Your testing shows that you have the flu but you do not have pneumonia.  Take Tamiflu twice a day, you may use over-the-counter cough or cold medicines as needed.  Your oxygen is fine and you do not have any signs of pneumonia on your x-ray.  If you do develop severe or worsening difficulty breathing chest pain fevers vomiting or any other worsening symptoms return to the ER otherwise you can see your doctor in 3 days for recheck

## 2023-06-19 NOTE — ED Provider Notes (Signed)
 [place] EMERGENCY DEPARTMENT AT [facility] Provider Note   CSN: [id number] Arrival date & time: 06/19/23  1807     History  Chief Complaint  Patient presents with   Shortness of Breath    Heidi Dorsey is a 89 y.o. female.   Shortness of Breath  This patient is a 89 year old female, she has hypertension, evidently the patient has been on amiodarone as well, she presents to the hospital with a complaint of a cough chest congestion and a runny nose, no fevers or chills.  She was in a nursing facility where the flu was going around, the patient does have dementia and is not able to give any information about the timing of the symptoms but reportedly has been going on for a couple of days.    Home Medications Prior to Admission medications   Medication Sig Start Date End Date Taking? Authorizing Provider  oseltamivir (TAMIFLU) 75 MG capsule Take 1 capsule (75 mg total) by mouth every 12 (twelve) hours. 06/19/23  Yes Eber Hong, MD  amiodarone (PACERONE) 100 MG tablet TAKE ONE TABLET DAILY 06/13/23   Sonny Masters, FNP  losartan (COZAAR) 50 MG tablet TAKE ONE TABLET DAILY 05/02/23   Sonny Masters, FNP  sodium chloride 1 g tablet TAKE ONE TABLET FIVE DAYS A WEEK 05/02/23   Rakes, Doralee Albino, FNP      Allergies    Allegra [fexofenadine], Estrace [estradiol], Miacalcin [calcitonin], Nsaids, Benadryl [diphenhydramine], Atrovent hfa [ipratropium bromide hfa], Paraphenylenediamine, Augmentin [amoxicillin-pot clavulanate], Biaxin [clarithromycin], Cipro [ciprofloxacin hcl], Codeine, Epipen [epinephrine], Erythromycin, Flonase [fluticasone], Latex, Macrobid [nitrofurantoin], Other, Silenor [doxepin hcl], Sulfa antibiotics, Sumycin [tetracycline], and Zestril [lisinopril]    Review of Systems   Review of Systems  Unable to perform ROS: Dementia  Respiratory:  Positive for shortness of breath.     Physical Exam Updated Vital Signs BP (!) 152/56 (BP Location: Left Arm)    Pulse 65   Temp 98.2 F (36.8 C) (Oral)   Resp 20   Ht 1.524 m (5')   Wt 61.2 kg   SpO2 92%   BMI 26.37 kg/m  Physical Exam Vitals and nursing note reviewed.  Constitutional:      General: She is not in acute distress.    Appearance: She is well-developed.  HENT:     Head: Normocephalic and atraumatic.     Mouth/Throat:     Pharynx: No oropharyngeal exudate.  Eyes:     General: No scleral icterus.       Right eye: No discharge.        Left eye: No discharge.     Conjunctiva/sclera: Conjunctivae normal.     Pupils: Pupils are equal, round, and reactive to light.  Neck:     Thyroid: No thyromegaly.     Vascular: No JVD.  Cardiovascular:     Rate and Rhythm: Normal rate and regular rhythm.     Heart sounds: Normal heart sounds. No murmur heard.    No friction rub. No gallop.  Pulmonary:     Effort: Pulmonary effort is normal. No respiratory distress.     Breath sounds: Normal breath sounds. No wheezing or rales.  Abdominal:     General: Bowel sounds are normal. There is no distension.     Palpations: Abdomen is soft. There is no mass.     Tenderness: There is no abdominal tenderness.  Musculoskeletal:        General: No tenderness. Normal range of motion.  Cervical back: Normal range of motion and neck supple.  Lymphadenopathy:     Cervical: No cervical adenopathy.  Skin:    General: Skin is warm and dry.     Findings: No erythema or rash.  Neurological:     Mental Status: She is alert.     Coordination: Coordination normal.  Psychiatric:        Behavior: Behavior normal.     ED Results / Procedures / Treatments   Labs (all labs ordered are listed, but only abnormal results are displayed) Labs Reviewed  RESP PANEL BY RT-PCR (RSV, FLU A&B, COVID)  RVPGX2 - Abnormal; Notable for the following components:      Result Value   Influenza A by PCR POSITIVE (*)    All other components within normal limits    EKG None  Radiology DG Chest 2 View Result Date:  06/19/2023 CLINICAL DATA:  Cough. EXAM: CHEST - 2 VIEW COMPARISON:  December 02, 2022. FINDINGS: Stable cardiomediastinal silhouette. Both lungs are clear. Interval development of midthoracic compression deformity concerning for fracture of indeterminate age. IMPRESSION: No acute cardiopulmonary abnormality seen. Interval development of midthoracic compression deformity concerning for fracture of indeterminate age. Electronically Signed   By: Lupita Raider M.D.   On: 06/19/2023 19:31    Procedures Procedures    Medications Ordered in ED Medications  oseltamivir (TAMIFLU) capsule 75 mg (has no administration in time range)    ED Course/ Medical Decision Making/ A&P                                 Medical Decision Making Amount and/or Complexity of Data Reviewed Radiology: ordered.  Risk Prescription drug management.    lung sounds are actually clear but she coughs from time to time and has rhonchorous sounds when she coughs.  Vital signs are unremarkable, she is not hypoxic, she is 93 to 94% on room air with a temperature of 98.2 and a heart rate of 65.  She is normotensive.  Will obtain respiratory panel to rule out COVID flu and RSV, chest x-ray to make sure there is no pneumonia.  She does not appear unstable at all right now.  Flu positive, x-ray without any acute findings in the pulmonary findings        Final Clinical Impression(s) / ED Diagnoses Final diagnoses:  Influenza    Rx / DC Orders ED Discharge Orders          Ordered    oseltamivir (TAMIFLU) 75 MG capsule  Every 12 hours        06/19/23 1959              Eber Hong, MD 06/19/23 [phone]

## 2023-06-19 NOTE — ED Notes (Signed)
 Pt states her left hand has been a bit numb for months and also c/o losing balance more frequently than usual.

## 2023-07-15 NOTE — Patient Instructions (Signed)
 Visit Information  Thank you for taking time to visit with me today. Please don't hesitate to contact me if I can be of assistance to you.   Following are the goals we discussed today:   Goals Addressed             This Visit's Progress    Patient will be able to attend appointments, receive all medicines and have improved sleep RN CM       Interventions Today    Flowsheet Row Most Recent Value  Chronic Disease   Chronic disease during today's visit Hypertension (HTN), Atrial Fibrillation (AFib), Other  [Medication Adherence, prevent patient from cancelling appointments, sleep disturbance, Dementia?]  General Interventions   General Interventions Discussed/Reviewed General Interventions Discussed, Durable Medical Equipment (DME), Community Resources, Doctor Visits  Doctor Visits Discussed/Reviewed Doctor Visits Discussed, PCP, Specialist  Durable Medical Equipment (DME) BP Cuff  PCP/Specialist Visits Compliance with follow-up visit  [patient has been known to call to cancel appointments without family knowing]  Exercise Interventions   Exercise Discussed/Reviewed Exercise Discussed, Physical Activity, Assistive device use and maintanence  Physical Activity Discussed/Reviewed Physical Activity Discussed, Home Exercise Program (HEP)  Education Interventions   Education Provided Provided Education, Provided Web-based Education  Provided Verbal Education On Mental Health/Coping with Illness, Medication, Other  Mental Health Interventions   Mental Health Discussed/Reviewed Mental Health Discussed, Coping Strategies, Depression, Other  Nutrition Interventions   Nutrition Discussed/Reviewed Nutrition Discussed, Fluid intake, Decreasing salt  Pharmacy Interventions   Pharmacy Dicussed/Reviewed Pharmacy Topics Discussed, Medication Adherence, Affording Medications  Medication Adherence Not taking medication  [family has to monitor medication administration by bringing them to the home at  the time due to prevent patient from missing doses]  Safety Interventions   Safety Discussed/Reviewed Safety Discussed, Fall Risk, Home Safety  Home Safety Assistive Devices  Advanced Directive Interventions   Advanced Directives Discussed/Reviewed Advanced Directives Discussed  Kristine Linea is POA]              Our next appointment is by telephone on pending at pending outreach with daughter  Please call the care guide team at [phone] if you need to cancel or reschedule your appointment.   If you are experiencing a Mental Health or Behavioral Health Crisis or need someone to talk to, please call the Suicide and Crisis Lifeline: 988 call the [place] National Suicide Prevention Lifeline: (681)169-7220 or TTY: (725)290-9017 TTY 417-674-3626) to talk to a trained counselor call 1-800-273-TALK (toll free, 24 hour hotline) call the [facility]: (629) 342-9303 call 911   Patient verbalizes understanding of instructions and care plan provided today and agrees to view in MyChart. Active MyChart status and patient understanding of how to access instructions and care plan via MyChart confirmed with patient.     The patient has been provided with contact information for the care management team and has been advised to call with any health related questions or concerns.   Adrienna Karis L. Noelle Penner, RN, BSN, CCM [facility] RN Care Manager [phone]

## 2023-09-06 ENCOUNTER — Other Ambulatory Visit: Payer: Self-pay | Admitting: Family Medicine

## 2023-10-07 ENCOUNTER — Emergency Department (HOSPITAL_COMMUNITY)

## 2023-10-07 ENCOUNTER — Emergency Department (HOSPITAL_COMMUNITY)
Admission: EM | Admit: 2023-10-07 | Discharge: 2023-10-07 | Disposition: A | Discharge status: Home / Self Care | Attending: Emergency Medicine | Admitting: Emergency Medicine

## 2023-10-07 ENCOUNTER — Other Ambulatory Visit: Payer: Self-pay

## 2023-10-07 DIAGNOSIS — Z79899 Other long term (current) drug therapy: Secondary | ICD-10-CM | POA: Insufficient documentation

## 2023-10-07 DIAGNOSIS — Z91040 Latex allergy status: Secondary | ICD-10-CM | POA: Insufficient documentation

## 2023-10-07 DIAGNOSIS — M25551 Pain in right hip: Secondary | ICD-10-CM | POA: Diagnosis present

## 2023-10-07 DIAGNOSIS — S7001XA Contusion of right hip, initial encounter: Secondary | ICD-10-CM | POA: Insufficient documentation

## 2023-10-07 DIAGNOSIS — W0110XA Fall on same level from slipping, tripping and stumbling with subsequent striking against unspecified object, initial encounter: Secondary | ICD-10-CM | POA: Insufficient documentation

## 2023-10-07 DIAGNOSIS — I10 Essential (primary) hypertension: Secondary | ICD-10-CM | POA: Insufficient documentation

## 2023-10-07 LAB — CBC WITH DIFFERENTIAL/PLATELET
Abs Immature Granulocytes: 0.02 10*3/uL (ref 0.00–0.07)
Basophils Absolute: 0.1 10*3/uL (ref 0.0–0.1)
Basophils Relative: 1 %
Eosinophils Absolute: 0.1 10*3/uL (ref 0.0–0.5)
Eosinophils Relative: 1 %
HCT: 37.3 % (ref 36.0–46.0)
Hemoglobin: 11.9 g/dL — ABNORMAL LOW (ref 12.0–15.0)
Immature Granulocytes: 0 %
Lymphocytes Relative: 14 %
Lymphs Abs: 0.7 10*3/uL (ref 0.7–4.0)
MCH: 29.9 pg (ref 26.0–34.0)
MCHC: 31.9 g/dL (ref 30.0–36.0)
MCV: 93.7 fL (ref 80.0–100.0)
Monocytes Absolute: 0.5 10*3/uL (ref 0.1–1.0)
Monocytes Relative: 9 %
Neutro Abs: 4 10*3/uL (ref 1.7–7.7)
Neutrophils Relative %: 75 %
Platelets: 153 10*3/uL (ref 150–400)
RBC: 3.98 MIL/uL (ref 3.87–5.11)
RDW: 16.1 % — ABNORMAL HIGH (ref 11.5–15.5)
WBC: 5.3 10*3/uL (ref 4.0–10.5)
nRBC: 0 % (ref 0.0–0.2)

## 2023-10-07 LAB — PROTIME-INR
INR: 1 (ref 0.8–1.2)
Prothrombin Time: 13.7 s (ref 11.4–15.2)

## 2023-10-07 LAB — URINALYSIS, ROUTINE W REFLEX MICROSCOPIC
Bilirubin Urine: NEGATIVE
Glucose, UA: NEGATIVE mg/dL
Hgb urine dipstick: NEGATIVE
Ketones, ur: NEGATIVE mg/dL
Leukocytes,Ua: NEGATIVE
Nitrite: NEGATIVE
Protein, ur: NEGATIVE mg/dL
Specific Gravity, Urine: 1.016 (ref 1.005–1.030)
pH: 7 (ref 5.0–8.0)

## 2023-10-07 LAB — BASIC METABOLIC PANEL WITH GFR
Anion gap: 11 (ref 5–15)
BUN: 17 mg/dL (ref 8–23)
CO2: 25 mmol/L (ref 22–32)
Calcium: 9.1 mg/dL (ref 8.9–10.3)
Chloride: 103 mmol/L (ref 98–111)
Creatinine, Ser: 0.86 mg/dL (ref 0.44–1.00)
GFR, Estimated: >60 mL/min (ref >60)
Glucose, Bld: 102 mg/dL — ABNORMAL HIGH (ref 70–99)
Potassium: 4.2 mmol/L (ref 3.5–5.1)
Sodium: 139 mmol/L (ref 135–145)

## 2023-10-07 MED ORDER — LOSARTAN POTASSIUM 25 MG PO TABS
50.0000 mg | ORAL_TABLET | Freq: Once | ORAL | Status: AC
  Administered 2023-10-07: 50 mg via ORAL
  Filled 2023-10-07: qty 2

## 2023-10-07 NOTE — ED Notes (Signed)
 Son Heidi Dorsey given update that pt is being transported back to facility at this time.

## 2023-10-07 NOTE — ED Notes (Signed)
Sitter is at bedside with patient.

## 2023-10-07 NOTE — ED Notes (Signed)
 Report given to the nurse at [facility].

## 2023-10-07 NOTE — ED Notes (Signed)
 Pt has fall bracelet as well as yellow grip socks on for safety.

## 2023-10-07 NOTE — ED Notes (Signed)
 Pt has been very verbally abusive to staff and combative calling staff: clowns stupid dumb and has repeatedly threatened to get out of bed after being educated on the dangers of falls.

## 2023-10-07 NOTE — ED Notes (Signed)
 Patients right leg is shorter than the left. Patient states her pain is a 3/10 when she is not moving. Patient is also stating she has a mild headache. Patient is also stating she has some numbness and tingling in her left hand.

## 2023-10-07 NOTE — ED Notes (Signed)
 Asked CN to pull and give medication, stated she was busy at the moment she is aware.

## 2023-10-07 NOTE — ED Notes (Signed)
 Spoke with patients grandson and he is aware that patient is being discharged back to [facility] and is okay with patient going back via EMS.

## 2023-10-07 NOTE — ED Notes (Signed)
 [facility] called to transport patient back to facility. Paramedic aware

## 2023-10-07 NOTE — ED Triage Notes (Signed)
 Pt arrived via [facility] from [facility] [place] c/o a fall that occurred 2 days ago. Pt endorses R hip pain 9/10. Denies use of blood thinners, denies hitting head, denies LOC. Hx HTN and baseline bradycardia at 55

## 2023-10-07 NOTE — Discharge Instructions (Signed)
 Lease follow-up closely with your primary care doctor on an outpatient basis.  Return to emergency department immediately for any new or worsening symptoms.

## 2023-10-07 NOTE — ED Notes (Signed)
 Pt has had a diet ordered and is eating it at bedside,

## 2023-10-07 NOTE — ED Notes (Signed)
 Pt has called out several times back to back stating she needs to use the bathroom, once back in bed the pt will call out again stating she urgently has to use the bathroom again. No more then a few drops have been expelled the last few times to the bedside commode

## 2023-10-07 NOTE — ED Provider Notes (Signed)
 [place] EMERGENCY DEPARTMENT AT [facility] Provider Note   CSN: [id number] Arrival date & time: 10/07/23  9082     Patient presents with: Fall   Heidi Dorsey is a 89 y.o. female.   Patient is a 89 year old female who presents to the emergency department the chief complaint of right hip pain.  Patient notes that approximate 2 days ago she had a ground-level fall striking her right hip.  She notes that she has had pain with ambulation since that time.  She does live in an assisted living facility.  She did not strike her head or injuring her neck or back during the fall.  She is not currently on any anticoagulation.  She denies any chest pain or abdominal pain.  She denies any long bone or joint pain at this time.   Fall       Prior to Admission medications   Medication Sig Start Date End Date Taking? Authorizing Provider  acetaminophen  (TYLENOL ) 325 MG tablet Take 650 mg by mouth every 8 (eight) hours as needed for moderate pain (pain score 4-6).   Yes [provider]  amiodarone  (PACERONE ) 100 MG tablet TAKE ONE TABLET DAILY 09/06/23  Yes Rakes, Rock HERO, FNP  losartan  (COZAAR ) 50 MG tablet TAKE ONE TABLET DAILY 05/02/23  Yes Rakes, Rock HERO, FNP  Multiple Vitamins-Minerals (CENTRUM ADULT PO) Take 1 tablet by mouth daily.   Yes [provider]  sodium chloride  1 g tablet TAKE ONE TABLET FIVE DAYS A WEEK 05/02/23  Yes Rakes, Rock HERO, FNP    Allergies: Allegra [fexofenadine], Estrace [estradiol], Miacalcin [calcitonin], Nsaids, Benadryl [diphenhydramine], Atrovent hfa [ipratropium bromide hfa], Paraphenylenediamine, Augmentin [amoxicillin -pot clavulanate], Biaxin [clarithromycin], Cipro [ciprofloxacin hcl], Codeine, Epipen [epinephrine], Erythromycin, Flonase [fluticasone], Latex, Macrobid [nitrofurantoin], Other, Silenor  [doxepin  hcl], Sulfa antibiotics, Sumycin [tetracycline], and Zestril [lisinopril]    Review of Systems  Musculoskeletal:         Pain to right hip  All other systems reviewed and are negative.   Updated Vital Signs BP (!) 186/56   Pulse (!) 55   Temp 98.6 F (37 C) (Oral)   Resp 20   SpO2 98%   Physical Exam Vitals and nursing note reviewed.  Constitutional:      Appearance: Normal appearance.  HENT:     Head: Normocephalic and atraumatic.     Nose: Nose normal.     Mouth/Throat:     Mouth: Mucous membranes are moist.   Eyes:     Extraocular Movements: Extraocular movements intact.     Conjunctiva/sclera: Conjunctivae normal.     Pupils: Pupils are equal, round, and reactive to light.    Cardiovascular:     Rate and Rhythm: Normal rate and regular rhythm.     Pulses: Normal pulses.     Heart sounds: Normal heart sounds. No murmur heard.    No gallop.  Pulmonary:     Effort: Pulmonary effort is normal. No respiratory distress.     Breath sounds: Normal breath sounds. No stridor. No wheezing, rhonchi or rales.  Chest:     Chest wall: No tenderness.  Abdominal:     General: Abdomen is flat. Bowel sounds are normal. There is no distension.     Palpations: Abdomen is soft.     Tenderness: There is no abdominal tenderness. There is no guarding.   Musculoskeletal:        General: Normal range of motion.     Cervical back: Normal range of motion and  neck supple. No rigidity or tenderness.     Comments: Tenderness palpation of the lateral aspect of the right hip, nontender palpation over right ankle, tender to palpation noted over right knee, nontender palpation of the left lower extremity throughout, pelvis stable to AP and lateral compression, DP and PT pulse are 2+ distally, sensation intact distally, full range of motion noted throughout, gait steady with assistance, nontender to palpation over bilateral upper extremities, nontender palpation over thoracic and lumbar spine   Skin:    General: Skin is warm and dry.     Findings: No bruising or rash.   Neurological:     General: No focal deficit  present.     Mental Status: She is alert and oriented to person, place, and time. Mental status is at baseline.     Cranial Nerves: No cranial nerve deficit.     Sensory: No sensory deficit.     Motor: No weakness.     Coordination: Coordination normal.     Gait: Gait normal.   Psychiatric:        Mood and Affect: Mood normal.        Behavior: Behavior normal.        Thought Content: Thought content normal.        Judgment: Judgment normal.     (all labs ordered are listed, but only abnormal results are displayed) Labs Reviewed  BASIC METABOLIC PANEL WITH GFR - Abnormal; Notable for the following components:      Result Value   Glucose, Bld 102 (*)    All other components within normal limits  CBC WITH DIFFERENTIAL/PLATELET - Abnormal; Notable for the following components:   Hemoglobin 11.9 (*)    RDW 16.1 (*)    All other components within normal limits  URINALYSIS, ROUTINE W REFLEX MICROSCOPIC - Abnormal; Notable for the following components:   APPearance HAZY (*)    All other components within normal limits  PROTIME-INR    EKG: None  Radiology: CT Hip Right Wo Contrast Result Date: 10/07/2023 CLINICAL DATA:  Fall.  Hip pain. EXAM: CT OF THE RIGHT HIP WITHOUT CONTRAST TECHNIQUE: Multidetector CT imaging of the right hip was performed according to the standard protocol. Multiplanar CT image reconstructions were also generated. RADIATION DOSE REDUCTION: This exam was performed according to the departmental dose-optimization program which includes automated exposure control, adjustment of the mA and/or kV according to patient size and/or use of iterative reconstruction technique. COMPARISON:  Same-day right hip radiographs dated 10/07/2023. FINDINGS: Bones/Joint/Cartilage No acute fracture or dislocation. The right femoral head is seated within the acetabulum. Mild osteoarthritis of the right hip with joint space narrowing and osteophytosis. Visualized portions of the right  sacroiliac joint are intact. Pubic symphysis is anatomically aligned with advanced degenerative changes. Ligaments Ligaments are suboptimally evaluated by CT. Muscles and Tendons Muscles are within normal limits. No intramuscular fluid collection or hematoma. Soft tissue No fluid collection or hematoma. No soft tissue mass. Atherosclerotic calcification of the right common iliac artery and its branches. IMPRESSION: 1. No acute osseous abnormality. 2. Mild osteoarthritis of the right hip. 3. Advanced degenerative changes of the pubic symphysis. Electronically Signed   By: Harrietta Sherry M.D.   On: 10/07/2023 14:41   DG Knee Complete 4 Views Right Result Date: 10/07/2023 CLINICAL DATA:  pain, diffuse fall EXAM: RIGHT KNEE - COMPLETE 4+ VIEW COMPARISON:  None Available. FINDINGS: No fracture, joint effusion or dislocation. Minimal medial compartment osteoarthritis. No suspicious focal osseous lesions. No radiopaque  foreign bodies. Vascular calcifications in the posterior soft tissues. IMPRESSION: No fracture or dislocation. Minimal medial compartment osteoarthritis. Electronically Signed   By: Selinda DELENA Blue M.D.   On: 10/07/2023 11:28   DG Lumbar Spine Complete Result Date: 10/07/2023 CLINICAL DATA:  low back pain, fall EXAM: LUMBAR SPINE - COMPLETE 4+ VIEW COMPARISON:  None Available. FINDINGS: This report assumes 5 non rib-bearing lumbar vertebrae. Lumbar vertebral body heights are preserved, with no fracture. Moderate to marked multilevel degenerative disc disease in the mid to lower lumbar spine, most prominent at L4-5 and L5-S1. Mild 3 mm retrolisthesis at L1-2 and L2-3. L4-5 6 mm anterolisthesis. Moderate bilateral lower lumbar facet arthropathy. No aggressive appearing focal osseous lesions. Mild lumbar levocurvature. Abdominal aortic atherosclerosis. IMPRESSION: 1. No lumbar spine fracture. 2. Moderate to marked multilevel lumbar spondylosis, most prominent at L4-5 and L5-S1. 3. Mild lumbar  levocurvature. Electronically Signed   By: Selinda DELENA Blue M.D.   On: 10/07/2023 11:23   DG Hip Unilat W or Wo Pelvis 2-3 Views Right Result Date: 10/07/2023 CLINICAL DATA:  fall, diffuse pain EXAM: DG HIP (WITH OR WITHOUT PELVIS) 2-3V RIGHT COMPARISON:  None Available. FINDINGS: Diffuse osteopenia. No pelvic fracture or diastasis. No right hip fracture or dislocation. No suspicious focal osseous lesions. Degenerative changes at the symphysis pubis and in the lower lumbar spine. Mild bilateral hip osteoarthritis. IMPRESSION: No right hip fracture or dislocation. Diffuse osteopenia. Electronically Signed   By: Selinda DELENA Blue M.D.   On: 10/07/2023 11:20     Procedures   Medications Ordered in the ED - No data to display                                  Medical Decision Making Patient is doing well at this time and is stable for discharge home.  Discussed with patient that all imaging in the emergency department has been unremarkable.  Patient has no indication for hip fracture on x-ray or CT scan.  X-ray of the right knee and lumbar spine were unremarkable as well.  She did not strike her head during the fall.  She had no tenderness over cervical or thoracic spine.  She had no other long bone or joint pain noted on exam.  She was able to ambulate in the emergency department under her own power.  Discussed with patient and need for continuing to use her walker.  Patient is stable for discharge home back to her assisted living facility at this point.  Did discuss findings with her family as well.  Strict turn precautions were discussed for any new or worsening symptoms.  Patient and family voiced understanding and had no additional questions.  Amount and/or Complexity of Data Reviewed Labs: ordered. Radiology: ordered.        Final diagnoses:  Contusion of right hip, initial encounter    ED Discharge Orders     None          Daralene Lonni JONETTA DEVONNA 10/07/23 1656    Suzette Pac, MD 10/10/23 [phone]

## 2023-10-07 NOTE — ED Notes (Signed)
 Pt has a posey bed alarm on and call light in reach. Pt is becoming agitated and keeps calling out multiple times in a row. PA notified and Medic notified too.

## 2023-10-07 NOTE — ED Notes (Signed)
Patient ambulated to restroom with two person assist.

## 2023-10-07 NOTE — ED Notes (Signed)
 Attempted to call [facility] and give report.

## 2023-10-07 NOTE — ED Provider Notes (Signed)
  [place] EMERGENCY DEPARTMENT AT [facility] Provider Note   CSN: [id number] Arrival date & time: 10/07/23  9082  Note created in error.  Please see note from same day   Daralene Lonni JONETTA DEVONNA 10/07/23 1701    Suzette Pac, MD 10/10/23 [phone]

## 2023-10-07 NOTE — ED Notes (Signed)
 Pt repeatedly continues to call out to yell at the people that enter her room, both side rails are up as well as posey alarm and pt advised to stay in the bed for safety due to coming in for a fall.

## 2023-10-07 NOTE — ED Notes (Signed)
 Pt is becoming confused wanting to talk to family and asking to be discharged.

## 2023-10-07 NOTE — ED Notes (Signed)
 Pt is sitting in a recliner with posey bed alarm and call light in reach. Pt is comfortable and resting at this time.

## 2023-10-07 NOTE — ED Notes (Signed)
 Called CT and they stated they would be getting patient in about 15-20 minutes

## 2023-10-07 NOTE — ED Notes (Signed)
 Patient has been educated on the importance of why she needs to stay in the bed, due to her inability to be able to walk well. Patient was not happy that she is being told to stay In the bed. Patient stated that she is an educated woman and knows more than everyone here ans she is hungry.

## 2023-11-01 ENCOUNTER — Other Ambulatory Visit: Payer: Self-pay | Admitting: Family Medicine

## 2023-11-21 ENCOUNTER — Ambulatory Visit: Payer: Self-pay

## 2023-11-21 NOTE — Telephone Encounter (Signed)
 FYI Only or Action Required?: FYI only for provider.  Patient was last seen in primary care on 04/21/2023 by Severa Rock HERO, FNP.  Called Nurse Triage reporting Pelvic Pain.  Symptoms began several months ago.  Interventions attempted: OTC medications: tylenol  and Rest, hydration, or home remedies.  Symptoms are: gradually worsening.  Triage Disposition: See PCP Within 2 Weeks  Patient/caregiver understands and will follow disposition?: Yes        Copied from CRM [id number]. Topic: Clinical - Red Word Triage >> Nov 21, 2023  8:10 AM Farrel B wrote: [facility] that prompted transfer to Nurse Triage: patient's sister ms. Mannie states the sister is constantly having pains in the pelvic area and needs assistance. Reason for Disposition  [1] Pelvic pain is a chronic symptom (recurrent or ongoing AND [2] present > 4 weeks)  Answer Assessment - Initial Assessment Questions 1. LOCATION: Where does it hurt?      Pelvic/groin pt daughter doesn't really know 2. RADIATION: Does the pain shoot anywhere else? (e.g., lower back, groin, thighs)     unknown 3. ONSET: When did the pain begin? (e.g., minutes, hours or days ago)      Over a month  4. SUDDEN: Gradual or sudden onset?     unknown 5. PATTERN Does the pain come and go, or is it constant?     Comes and goes 6. SEVERITY: How bad is the pain?  (e.g., Scale 1-10; mild, moderate, or severe)     Mod-only when moving legs 7. RECURRENT SYMPTOM: Have you ever had this type of pelvic pain before? If Yes, ask: When was the last time? and What happened that time?      Has been ongoing for months 8. CAUSE: What do you think is causing the pelvic pain?     unknown 9. RELIEVING/AGGRAVATING FACTORS: What makes it better or worse? (e.g., activity/rest, sexual intercourse, voiding, passing stool)     This has been ongoing and pt can't walk,  moving her legs 10. OTHER SYMPTOMS: Has there been any other symptoms? (e.g.,  fever, constipation, diarrhea, urine problems, vaginal bleeding, vaginal discharge, or vomiting?       dizziness  Protocols used: Pelvic Pain - Female-A-AH

## 2023-11-21 NOTE — Telephone Encounter (Signed)
 Pt has appt

## 2023-11-22 ENCOUNTER — Encounter: Payer: Self-pay | Admitting: Family

## 2023-11-22 ENCOUNTER — Inpatient Hospital Stay (HOSPITAL_BASED_OUTPATIENT_CLINIC_OR_DEPARTMENT_OTHER)
Admission: EM | Admit: 2023-11-22 | Discharge: 2023-11-24 | DRG: 543 | Disposition: A | Discharge status: Skilled Nursing Facility | Attending: Internal Medicine | Admitting: Internal Medicine

## 2023-11-22 ENCOUNTER — Emergency Department (HOSPITAL_BASED_OUTPATIENT_CLINIC_OR_DEPARTMENT_OTHER)

## 2023-11-22 ENCOUNTER — Ambulatory Visit: Admitting: Nurse Practitioner

## 2023-11-22 ENCOUNTER — Encounter (HOSPITAL_BASED_OUTPATIENT_CLINIC_OR_DEPARTMENT_OTHER): Payer: Self-pay

## 2023-11-22 ENCOUNTER — Other Ambulatory Visit: Payer: Self-pay

## 2023-11-22 ENCOUNTER — Ambulatory Visit (INDEPENDENT_AMBULATORY_CARE_PROVIDER_SITE_OTHER)

## 2023-11-22 ENCOUNTER — Ambulatory Visit: Admitting: Family

## 2023-11-22 VITALS — BP 161/55 | HR 51 | Temp 97.4°F | Ht 60.0 in

## 2023-11-22 DIAGNOSIS — D649 Anemia, unspecified: Secondary | ICD-10-CM | POA: Diagnosis present

## 2023-11-22 DIAGNOSIS — E785 Hyperlipidemia, unspecified: Secondary | ICD-10-CM | POA: Diagnosis not present

## 2023-11-22 DIAGNOSIS — W19XXXA Unspecified fall, initial encounter: Secondary | ICD-10-CM

## 2023-11-22 DIAGNOSIS — Z8262 Family history of osteoporosis: Secondary | ICD-10-CM

## 2023-11-22 DIAGNOSIS — Z808 Family history of malignant neoplasm of other organs or systems: Secondary | ICD-10-CM | POA: Diagnosis not present

## 2023-11-22 DIAGNOSIS — M8580 Other specified disorders of bone density and structure, unspecified site: Secondary | ICD-10-CM | POA: Diagnosis present

## 2023-11-22 DIAGNOSIS — Z8041 Family history of malignant neoplasm of ovary: Secondary | ICD-10-CM

## 2023-11-22 DIAGNOSIS — M6281 Muscle weakness (generalized): Secondary | ICD-10-CM | POA: Diagnosis not present

## 2023-11-22 DIAGNOSIS — Z880 Allergy status to penicillin: Secondary | ICD-10-CM | POA: Diagnosis not present

## 2023-11-22 DIAGNOSIS — S32591A Other specified fracture of right pubis, initial encounter for closed fracture: Principal | ICD-10-CM | POA: Diagnosis present

## 2023-11-22 DIAGNOSIS — J45909 Unspecified asthma, uncomplicated: Secondary | ICD-10-CM | POA: Diagnosis present

## 2023-11-22 DIAGNOSIS — Z882 Allergy status to sulfonamides status: Secondary | ICD-10-CM

## 2023-11-22 DIAGNOSIS — S32592A Other specified fracture of left pubis, initial encounter for closed fracture: Secondary | ICD-10-CM | POA: Diagnosis present

## 2023-11-22 DIAGNOSIS — Z87442 Personal history of urinary calculi: Secondary | ICD-10-CM | POA: Diagnosis not present

## 2023-11-22 DIAGNOSIS — Z881 Allergy status to other antibiotic agents status: Secondary | ICD-10-CM

## 2023-11-22 DIAGNOSIS — I10 Essential (primary) hypertension: Secondary | ICD-10-CM | POA: Diagnosis present

## 2023-11-22 DIAGNOSIS — Z833 Family history of diabetes mellitus: Secondary | ICD-10-CM | POA: Diagnosis not present

## 2023-11-22 DIAGNOSIS — S32502A Unspecified fracture of left pubis, initial encounter for closed fracture: Secondary | ICD-10-CM

## 2023-11-22 DIAGNOSIS — R296 Repeated falls: Secondary | ICD-10-CM | POA: Diagnosis present

## 2023-11-22 DIAGNOSIS — W19XXXD Unspecified fall, subsequent encounter: Secondary | ICD-10-CM

## 2023-11-22 DIAGNOSIS — Z8052 Family history of malignant neoplasm of bladder: Secondary | ICD-10-CM

## 2023-11-22 DIAGNOSIS — M25552 Pain in left hip: Secondary | ICD-10-CM | POA: Diagnosis present

## 2023-11-22 DIAGNOSIS — S32502D Unspecified fracture of left pubis, subsequent encounter for fracture with routine healing: Secondary | ICD-10-CM

## 2023-11-22 DIAGNOSIS — K219 Gastro-esophageal reflux disease without esophagitis: Secondary | ICD-10-CM | POA: Diagnosis present

## 2023-11-22 DIAGNOSIS — Z79899 Other long term (current) drug therapy: Secondary | ICD-10-CM | POA: Diagnosis not present

## 2023-11-22 DIAGNOSIS — I4892 Unspecified atrial flutter: Secondary | ICD-10-CM | POA: Diagnosis present

## 2023-11-22 DIAGNOSIS — Z888 Allergy status to other drugs, medicaments and biological substances status: Secondary | ICD-10-CM

## 2023-11-22 DIAGNOSIS — E7800 Pure hypercholesterolemia, unspecified: Secondary | ICD-10-CM | POA: Diagnosis present

## 2023-11-22 DIAGNOSIS — S32599A Other specified fracture of unspecified pubis, initial encounter for closed fracture: Principal | ICD-10-CM

## 2023-11-22 DIAGNOSIS — I480 Paroxysmal atrial fibrillation: Secondary | ICD-10-CM | POA: Diagnosis present

## 2023-11-22 DIAGNOSIS — Z90710 Acquired absence of both cervix and uterus: Secondary | ICD-10-CM | POA: Diagnosis not present

## 2023-11-22 DIAGNOSIS — S329XXA Fracture of unspecified parts of lumbosacral spine and pelvis, initial encounter for closed fracture: Secondary | ICD-10-CM | POA: Diagnosis present

## 2023-11-22 DIAGNOSIS — Z8049 Family history of malignant neoplasm of other genital organs: Secondary | ICD-10-CM

## 2023-11-22 DIAGNOSIS — M84650A Pathological fracture in other disease, pelvis, initial encounter for fracture: Principal | ICD-10-CM | POA: Diagnosis present

## 2023-11-22 DIAGNOSIS — I7300 Raynaud's syndrome without gangrene: Secondary | ICD-10-CM | POA: Diagnosis present

## 2023-11-22 DIAGNOSIS — Z800 Family history of malignant neoplasm of digestive organs: Secondary | ICD-10-CM | POA: Diagnosis not present

## 2023-11-22 DIAGNOSIS — Z886 Allergy status to analgesic agent status: Secondary | ICD-10-CM

## 2023-11-22 DIAGNOSIS — S32509A Unspecified fracture of unspecified pubis, initial encounter for closed fracture: Secondary | ICD-10-CM | POA: Diagnosis not present

## 2023-11-22 DIAGNOSIS — S7002XA Contusion of left hip, initial encounter: Secondary | ICD-10-CM | POA: Diagnosis present

## 2023-11-22 DIAGNOSIS — Z91040 Latex allergy status: Secondary | ICD-10-CM

## 2023-11-22 DIAGNOSIS — R7303 Prediabetes: Secondary | ICD-10-CM | POA: Diagnosis present

## 2023-11-22 LAB — COMPREHENSIVE METABOLIC PANEL WITH GFR
ALT: 15 U/L (ref 0–44)
AST: 21 U/L (ref 15–41)
Albumin: 3.5 g/dL (ref 3.5–5.0)
Alkaline Phosphatase: 154 U/L — ABNORMAL HIGH (ref 38–126)
Anion gap: 10 (ref 5–15)
BUN: 22 mg/dL (ref 8–23)
CO2: 25 mmol/L (ref 22–32)
Calcium: 9.3 mg/dL (ref 8.9–10.3)
Chloride: 104 mmol/L (ref 98–111)
Creatinine, Ser: 1 mg/dL (ref 0.44–1.00)
GFR, Estimated: 54 mL/min — ABNORMAL LOW (ref >60)
Glucose, Bld: 98 mg/dL (ref 70–99)
Potassium: 4.1 mmol/L (ref 3.5–5.1)
Sodium: 139 mmol/L (ref 135–145)
Total Bilirubin: 0.6 mg/dL (ref 0.0–1.2)
Total Protein: 5.6 g/dL — ABNORMAL LOW (ref 6.5–8.1)

## 2023-11-22 LAB — CBC WITH DIFFERENTIAL/PLATELET
Abs Immature Granulocytes: 0.04 K/uL (ref 0.00–0.07)
Basophils Absolute: 0 K/uL (ref 0.0–0.1)
Basophils Relative: 1 %
Eosinophils Absolute: 0.1 K/uL (ref 0.0–0.5)
Eosinophils Relative: 1 %
HCT: 28.7 % — ABNORMAL LOW (ref 36.0–46.0)
Hemoglobin: 9.2 g/dL — ABNORMAL LOW (ref 12.0–15.0)
Immature Granulocytes: 1 %
Lymphocytes Relative: 15 %
Lymphs Abs: 0.9 K/uL (ref 0.7–4.0)
MCH: 29.5 pg (ref 26.0–34.0)
MCHC: 32.1 g/dL (ref 30.0–36.0)
MCV: 92 fL (ref 80.0–100.0)
Monocytes Absolute: 0.8 K/uL (ref 0.1–1.0)
Monocytes Relative: 14 %
Neutro Abs: 4.1 K/uL (ref 1.7–7.7)
Neutrophils Relative %: 68 %
Platelets: 192 K/uL (ref 150–400)
RBC: 3.12 MIL/uL — ABNORMAL LOW (ref 3.87–5.11)
RDW: 15.9 % — ABNORMAL HIGH (ref 11.5–15.5)
WBC: 6 K/uL (ref 4.0–10.5)
nRBC: 0 % (ref 0.0–0.2)

## 2023-11-22 MED ORDER — ACETAMINOPHEN 650 MG RE SUPP: 650.0000 mg | Freq: Four times a day (QID) | RECTAL | Status: DC | PRN

## 2023-11-22 MED ORDER — OXYCODONE HCL 5 MG PO TABS: 5.0000 mg | ORAL_TABLET | Freq: Four times a day (QID) | ORAL | Status: DC | PRN

## 2023-11-22 MED ORDER — ACETAMINOPHEN 325 MG PO TABS
650.0000 mg | ORAL_TABLET | Freq: Four times a day (QID) | ORAL | Status: DC | PRN
Start: 2023-11-22 — End: 2023-11-24
  Administered 2023-11-23 (×2): 650 mg via ORAL
  Filled 2023-11-22: qty 2

## 2023-11-22 MED ORDER — HYDRALAZINE HCL 20 MG/ML IJ SOLN
5.0000 mg | Freq: Four times a day (QID) | INTRAMUSCULAR | Status: DC | PRN
  Administered 2023-11-23 (×2): 5 mg via INTRAVENOUS
  Filled 2023-11-22: qty 1

## 2023-11-22 MED ORDER — ALBUTEROL SULFATE HFA 108 (90 BASE) MCG/ACT IN AERS: 1.0000 | INHALATION_SPRAY | Freq: Four times a day (QID) | RESPIRATORY_TRACT | Status: DC | PRN

## 2023-11-22 NOTE — ED Provider Notes (Signed)
  EMERGENCY DEPARTMENT AT [facility] Provider Note   CSN: [id number] Arrival date & time: 11/22/23  1625     Patient presents with: Fall and Hip Pain   Heidi Dorsey is a 89 y.o. female.    Fall Pertinent negatives include no chest pain, no abdominal pain and no shortness of breath.  Hip Pain Pertinent negatives include no chest pain, no abdominal pain and no shortness of breath.    According to  patient, she fell about 3 days ago.  Landed more on her left hip.  Since then, she is endorsing some pain with weightbearing of her pelvic area.  Is also endorsing pain whenever she has any, abduction of either left or right leg.  No bowel or bladder incontinence.  No new back pain.  No saddle anesthesia.  Patient states he has been on ambulate but with significant pain.  Did not hit her head.  No neck pain.  Patient denies any Syncopal event before or after.  States it is purely mechanical whenever she lost her balance.   Previous medical history reviewed : Patient follow-up with PCP today.  Ordered x-rays.  Found to have a moderately displaced bilateral inferior and superior pubic rami fractures.     Prior to Admission medications   Medication Sig Start Date End Date Taking? Authorizing Provider  acetaminophen  (TYLENOL ) 325 MG tablet Take 650 mg by mouth every 8 (eight) hours as needed for moderate pain (pain score 4-6).    [provider]  amiodarone  (PACERONE ) 100 MG tablet TAKE ONE TABLET DAILY 09/06/23   Severa Rock HERO, FNP  losartan  (COZAAR ) 50 MG tablet TAKE ONE TABLET DAILY 11/01/23   Severa Rock HERO, FNP  Multiple Vitamins-Minerals (CENTRUM ADULT PO) Take 1 tablet by mouth daily.    [provider]  sodium chloride  1 g tablet TAKE ONE TABLET FIVE DAYS A WEEK 05/02/23   Rakes, Rock HERO, FNP    Allergies: Allegra [fexofenadine], Estrace [estradiol], Miacalcin [calcitonin], Nsaids, Benadryl [diphenhydramine], Atrovent hfa [ipratropium  bromide hfa], Paraphenylenediamine, Augmentin [amoxicillin -pot clavulanate], Biaxin [clarithromycin], Cipro [ciprofloxacin hcl], Codeine, Epipen [epinephrine], Erythromycin, Flonase [fluticasone], Latex, Macrobid [nitrofurantoin], Other, Silenor  [doxepin  hcl], Sulfa antibiotics, Sumycin [tetracycline], and Zestril [lisinopril]    Review of Systems  Constitutional:  Negative for chills and fever.  HENT:  Negative for ear pain and sore throat.   Eyes:  Negative for pain and visual disturbance.  Respiratory:  Negative for cough and shortness of breath.   Cardiovascular:  Negative for chest pain and palpitations.  Gastrointestinal:  Negative for abdominal pain and vomiting.  Genitourinary:  Negative for dysuria and hematuria.  Musculoskeletal:  Negative for arthralgias and back pain.  Skin:  Negative for color change and rash.  Neurological:  Negative for seizures and syncope.  All other systems reviewed and are negative.   Updated Vital Signs BP (!) 166/67 (BP Location: Right Arm)   Pulse 78   Temp 98.7 F (37.1 C)   Resp 18   Ht 5' (1.524 m)   Wt 53.3 kg   SpO2 97%   BMI 22.95 kg/m   Physical Exam Vitals and nursing note reviewed.  Constitutional:      General: She is not in acute distress.    Appearance: She is well-developed.  HENT:     Head: Normocephalic and atraumatic.  Eyes:     Conjunctiva/sclera: Conjunctivae normal.  Cardiovascular:     Rate and Rhythm: Normal rate and regular rhythm.     Heart  sounds: No murmur heard. Pulmonary:     Effort: Pulmonary effort is normal. No respiratory distress.     Breath sounds: Normal breath sounds.  Abdominal:     Palpations: Abdomen is soft.     Tenderness: There is no abdominal tenderness.  Musculoskeletal:        General: No swelling.     Cervical back: Neck supple.       Legs:  Skin:    General: Skin is warm and dry.     Capillary Refill: Capillary refill takes less than 2 seconds.  Neurological:     Mental Status:  She is alert.  Psychiatric:        Mood and Affect: Mood normal.     (all labs ordered are listed, but only abnormal results are displayed) Labs Reviewed  CBC WITH DIFFERENTIAL/PLATELET - Abnormal; Notable for the following components:      Result Value   RBC 3.12 (*)    Hemoglobin 9.2 (*)    HCT 28.7 (*)    RDW 15.9 (*)    All other components within normal limits  COMPREHENSIVE METABOLIC PANEL WITH GFR - Abnormal; Notable for the following components:   Total Protein 5.6 (*)    Alkaline Phosphatase 154 (*)    GFR, Estimated 54 (*)    All other components within normal limits    EKG: None  Radiology: DG Femur Portable Min 2 Views Left Result Date: 11/22/2023 CLINICAL DATA:  Pubic rami fractures. EXAM: LEFT FEMUR PORTABLE 2 VIEWS COMPARISON:  Right hip x-ray 11/22/2023. FINDINGS: There is no evidence of fracture or other focal bone lesions. Peripheral vascular calcifications are present. Soft tissues are otherwise within normal limits. There are mild degenerative changes of the medial compartment of the knee. IMPRESSION: 1. No acute fracture or dislocation. 2. Peripheral vascular disease. Electronically Signed   By: Greig Pique M.D.   On: 11/22/2023 17:29   DG HIP UNILAT W OR W/O PELVIS 2-3 VIEWS LEFT Result Date: 11/22/2023 CLINICAL DATA:  Left hip pain after fall. EXAM: DG HIP (WITH OR WITHOUT PELVIS) 2-3V*L* COMPARISON:  October 07, 2023. FINDINGS: Moderately displaced fractures are seen involving the medial portions of the bilateral inferior and superior pubic rami. No fracture or dislocation is seen involving the proximal femurs. Mild degenerative change of left hip is noted. IMPRESSION: Moderately displaced bilateral inferior and superior pubic rami fractures. 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 Electronically Signed   By: Lynwood Landy Raddle M.D.   On: 11/22/2023 15:40     Procedures   Medications Ordered in the ED - No data to display  Clinical Course as of 11/22/23  2231  Tue Nov 22, 2023  1801 Dr. Dozier: Weight bearing as tolerated with walker. Early ambulation. Sometimes need to be admitted for pain control and PT.  [TL]    Clinical Course User Index [TL] Simon Lavonia SAILOR, MD                                 Medical Decision Making Amount and/or Complexity of Data Reviewed Labs: ordered. Radiology: ordered.  Risk Decision regarding hospitalization.    According to  patient, she fell about 3 days ago.  Landed more on her left hip.  Since then, she is endorsing some pain with weightbearing of her pelvic area.  Is also endorsing pain whenever she has any, abduction of either left or right leg.  No  bowel or bladder incontinence.  No new back pain.  No saddle anesthesia.  Patient states he has been on ambulate but with significant pain.  Did not hit her head.  No neck pain.  Patient denies any Syncopal event before or after.  States it is purely mechanical whenever she lost her balance.   Previous medical history reviewed : Patient follow-up with PCP today.  Ordered x-rays.  Found to have a moderately displaced bilateral inferior and superior pubic rami fractures.  Upon exam, patient alert oriented.  Signs of trauma to the head or cervical thoracic or lumbar spine per my exam.  No indication for imaging.   Reviewed outpatient imaging.  Consistent for bilateral pubic rami fractures.  Spoke to Dr. Dozier orthopedics.  Recommended weightbearing as tolerated with walker.  Early ambulation.  Possible need for admission for pain control and PT.  Cussed this with the patient.  She states that she is currently in assisted living but basically takes care of herself.  She has had multiple falls here recently.  She is also having large degree of pain with ambulation.  Therefore, I do think she would benefit from physical therapy as well as ongoing pain control inpatient.  Did not strike head.  No indication for CT imaging of the head.  Negative cervical  thoracic and lumbar exam.  Hemoglobin 9.2.  Baseline around 11.5.  No concerns any kind of dark stools at this point time.  Continue to be monitored.          Final diagnoses:  Closed fracture of pubic ramus, unspecified laterality, initial encounter (HCC)  Anemia, unspecified type    ED Discharge Orders     None          Simon Lavonia SAILOR, MD 11/22/23 2231

## 2023-11-22 NOTE — Progress Notes (Signed)
 Subjective:    Patient ID: Heidi Dorsey, female    DOB: [birth year], 89 y.o.   MRN: [id number]  No chief complaint on file.  PT presents to the office today with left hip pain. She reports she has fallen several times. She fell in 10/07/23 and had a negative CT right hip. However, reports falling 4 days ago and having bruising on left hip extending down to her knee.  Hip Pain  The incident occurred more than 1 week ago. The injury mechanism was a fall. The pain is present in the left hip. The quality of the pain is described as aching. The pain is moderate. The pain has been Intermittent since onset.  Fall The accident occurred 3 to 5 days ago. The point of impact was the left hip. The pain is mild.      Review of Systems  Cardiovascular:  Positive for leg swelling.  Musculoskeletal:  Positive for back pain, gait problem and joint swelling.  All other systems reviewed and are negative.   Social History   Socioeconomic History   Marital status: Single    Spouse name: Not on file   Number of children: 2   Years of education: 12   Highest education level: 12th grade  Occupational History   Occupation: Retired    Comment: Scientist, product/process development  Tobacco Use   Smoking status: Never    Passive exposure: Never   Smokeless tobacco: Never   Tobacco comments:    Verified by Grandson/Healthcare Power of Attorney, Tonie Gravely.  Vaping Use   Vaping status: Never Used  Substance and Sexual Activity   Alcohol use: Yes    Alcohol/week: 2.0 standard drinks of alcohol    Types: 2 Glasses of wine per week    Comment: per week   Drug use: Never   Sexual activity: Not Currently    Partners: Male    Birth control/protection: Surgical  Other Topics Concern   Not on file  Social History Narrative   Right Handed   Lives in an apartment complex and her apartment is on ground level.    Drinks Half [employer] Coffee and Tea   Originally from [place], then lived in [place] most  of her life - moved here 09/2019 - feels out of place, having trouble finding others with similar interests   She is agnostic - not religious (although raised Catholic) - strong scientific beliefs    Social Drivers of Corporate investment banker Strain: Low Risk  (12/19/2022)   Overall Financial Resource Strain (CARDIA)    Difficulty of Paying Living Expenses: Not hard at all  Food Insecurity: No Food Insecurity (12/19/2022)   Hunger Vital Sign    Worried About Running Out of Food in the Last Year: Never true    Ran Out of Food in the Last Year: Never true  Transportation Needs: No Transportation Needs (12/19/2022)   PRAPARE - Administrator, Civil Service (Medical): No    Lack of Transportation (Non-Medical): No  Physical Activity: Inactive (12/19/2022)   Exercise Vital Sign    Days of Exercise per Week: 0 days    Minutes of Exercise per Session: 0 min  Stress: No Stress Concern Present (12/19/2022)   [employer] of Occupational Health - Occupational Stress Questionnaire    Feeling of Stress : Not at all  Social Connections: Moderately Isolated (12/19/2022)   Social Connection and Isolation Panel    Frequency of Communication with Friends and  Family: More than three times a week    Frequency of Social Gatherings with Friends and Family: More than three times a week    Attends Religious Services: More than 4 times per year    Active Member of [employer] or Organizations: No    Attends Banker Meetings: Never    Marital Status: Divorced   Family History  Problem Relation Age of Onset   Osteoporosis Mother    Ovarian cancer Mother    Uterine cancer Mother    Brain cancer Father    Osteoporosis Sister    Bladder Cancer Brother    Rectal cancer Brother    Skin cancer Brother    Diabetes Paternal Grandmother    Breast cancer Neg Hx         Objective:   Physical Exam Vitals reviewed.  Constitutional:      General: She is not in acute distress.    Appearance:  She is well-developed.  HENT:     Head: Normocephalic and atraumatic.  Eyes:     Pupils: Pupils are equal, round, and reactive to light.  Neck:     Thyroid : No thyromegaly.  Cardiovascular:     Rate and Rhythm: Normal rate and regular rhythm.     Heart sounds: Normal heart sounds. No murmur heard. Pulmonary:     Effort: Pulmonary effort is normal. No respiratory distress.     Breath sounds: Normal breath sounds. No wheezing.  Abdominal:     General: Bowel sounds are normal. There is no distension.     Palpations: Abdomen is soft.     Tenderness: There is no abdominal tenderness.  Musculoskeletal:        General: No tenderness. Normal range of motion.     Cervical back: Normal range of motion and neck supple.     Right lower leg: Edema (3+) present.     Left lower leg: Edema (3+) present.  Skin:    General: Skin is warm and dry.     Findings: Ecchymosis present.         Comments: Ecchymosis present on left hip extending down to knee  Neurological:     Mental Status: She is alert and oriented to person, place, and time.     Cranial Nerves: No cranial nerve deficit.     Deep Tendon Reflexes: Reflexes are normal and symmetric.  Psychiatric:        Behavior: Behavior normal.        Thought Content: Thought content normal.        Judgment: Judgment normal.       BP (!) 161/55   Pulse (!) 51   Temp (!) 97.4 F (36.3 C) (Temporal)   Ht 5' (1.524 m)   SpO2 97%   BMI 26.37 kg/m      Assessment & Plan:  [name] comes in today with chief complaint of No chief complaint on file.   Diagnosis and orders addressed:  1. Left hip pain (Primary - DG HIP UNILAT W OR W/O PELVIS 2-3 VIEWS LEFT; Future  2. Fall, initial encounter - DG HIP UNILAT W OR W/O PELVIS 2-3 VIEWS LEFT; Future  3. Closed displaced fracture of left pubis, initial encounter (HCC) Pt sent to ED Discussed precautions of not falling  Keep stabilized    Approx 65 mins spent with patient,  chart review, and education.   Return if symptoms worsen or fail to improve.    Bari Learn, FNP

## 2023-11-22 NOTE — H&P (Signed)
 History and Physical    Heidi Dorsey FMW:[id number] DOB: January 31, 1931 DOA: 11/22/2023  PCP: Severa Rock HERO, FNP  Patient coming from: [facility] ED  Chief Complaint: Left hip pain  HPI: Heidi Dorsey is a 89 y.o. female with medical history significant of paroxysmal A-fib/flutter not on anticoagulation, mild aortic regurgitation, asthma, hypertension, hyperlipidemia, CKD stage II, chronic hyponatremia, chronic anemia, nephrolithiasis, migraine headaches, osteoarthritis, osteopenia, prediabetes, Raynaud's syndrome, sciatica, spinal stenosis, vertebrobasilar artery syndrome, sleep apnea, BPPV, diverticulosis, GERD, hiatal hernia presenting with a chief complaint of pelvic pain.  Patient states she has chronic balance problems which led to a fall in her apartment 3 days ago.  She lives alone.  She landed on her left hip and has a large bruise in this area.  States she somehow managed to get up from the floor after the fall but had a lot of difficulty walking due to severe pelvic pain.  Denies head injury or loss of consciousness related to the fall.  She does not take any blood thinners.  Reports chronic cough since childhood and no change from baseline.  Denies shortness of breath, chest pain, nausea, vomiting, abdominal pain, or diarrhea.  Denies hematemesis, hematochezia, melena, or hematuria.  Patient was seen by her PCP today and had x-rays done which showed moderately displaced bilateral inferior and superior pubic rami fractures and no acute left hip fracture or dislocation.  ED Course: Hypertensive with SBP in the 170s initially.  Labs notable for hemoglobin 9.2 (baseline 11-12), MCV 92.0.  Orthopedics recommended nonoperative management with pain control, weightbearing as tolerated with walker/early ambulation, and PT/OT.  Review of Systems:  Review of Systems  All other systems reviewed and are negative.   Past Medical History:  Diagnosis Date   Asthma    BPPV (benign  paroxysmal positional vertigo)    Claustrophobia    Diverticulosis    GERD (gastroesophageal reflux disease)    H/O degenerative disc disease    Hiatal hernia    Hyperlipidemia    Hypertension    Kidney stones    Migraines    Osteoarthritis    Caused feet deformity   Osteopenia    Prediabetes    Preglaucoma    Raynauds syndrome    Rectocele    Rosacea    Sciatica    Sleep apnea    Spinal stenosis    Vertebrobasilar artery syndrome     Past Surgical History:  Procedure Laterality Date   ABDOMINAL HYSTERECTOMY  1978   ANTERIOR (CYSTOCELE) AND POSTERIOR REPAIR (RECTOCELE) WITH XENFORM GRAFT AND SACROSPINOUS FIXATION     BILATERAL OOPHORECTOMY  1997   bladder stones     kidney colic/ procedure performed in 1973 at [facility]   BLADDER SUSPENSION     BREAST BIOPSY Left 12/1987   neg   BREAST BIOPSY Right 02/1977   neg papilloma   CARDIAC CATHETERIZATION  2000   HERNIA REPAIR     plantar fibroma  Left 09/1997   Excised     reports that she has never smoked. She has never been exposed to tobacco smoke. She has never used smokeless tobacco. She reports current alcohol use of about 2.0 standard drinks of alcohol per week. She reports that she does not use drugs.  Allergies  Allergen Reactions   Allegra [Fexofenadine] Other (See Comments)    Syncope   Estrace [Estradiol] Other (See Comments)    Headache   Miacalcin [Calcitonin] Palpitations    Left bundle branch block   Nsaids  Other (See Comments)    Stomach ulcer after taking for 3 days in 2009. Negative H. Pylori test.   Benadryl [Diphenhydramine] Other (See Comments)    Syncope   Atrovent Hfa [Ipratropium Bromide Hfa] Other (See Comments)    Syncope    Paraphenylenediamine Other (See Comments)    Unknown reaction   Augmentin [Amoxicillin -Pot Clavulanate] Rash    OK to take plain amoxicillin    Biaxin [Clarithromycin] Rash    OK to take azithromycin   Cipro [Ciprofloxacin Hcl] Rash   Codeine Other (See Comments)     Unknown reaction   Epipen [Epinephrine] Other (See Comments)    Unknown reaction   Erythromycin Other (See Comments)    Stomach irritation   Flonase [Fluticasone] Other (See Comments)    Unknown reaction Similar reactions to corticoid steroids   Latex Rash   Macrobid [Nitrofurantoin] Other (See Comments)    Multiple mild symptoms - would prefer not to take again   Other Other (See Comments)    Propanol amine Anti-cholinergic compounds   Silenor  [Doxepin  Hcl] Other (See Comments)    Heart arrhythmia   Sulfa Antibiotics Other (See Comments)    Unknown reaction   Sumycin [Tetracycline] Other (See Comments)    Mouth sores OK to take doxycycline   Zestril [Lisinopril] Other (See Comments)    Bruising Cankers Arthralgias    Family History  Problem Relation Age of Onset   Osteoporosis Mother    Ovarian cancer Mother    Uterine cancer Mother    Brain cancer Father    Osteoporosis Sister    Bladder Cancer Brother    Rectal cancer Brother    Skin cancer Brother    Diabetes Paternal Grandmother    Breast cancer Neg Hx     Prior to Admission medications   Medication Sig Start Date End Date Taking? Authorizing Provider  acetaminophen  (TYLENOL ) 325 MG tablet Take 650 mg by mouth every 8 (eight) hours as needed for moderate pain (pain score 4-6).    [provider]  amiodarone  (PACERONE ) 100 MG tablet TAKE ONE TABLET DAILY 09/06/23   Severa Rock HERO, FNP  losartan  (COZAAR ) 50 MG tablet TAKE ONE TABLET DAILY 11/01/23   Severa Rock HERO, FNP  Multiple Vitamins-Minerals (CENTRUM ADULT PO) Take 1 tablet by mouth daily.    [provider]  sodium chloride  1 g tablet TAKE ONE TABLET FIVE DAYS A WEEK 05/02/23   Severa Rock HERO, FNP    Physical Exam: Vitals:   11/22/23 1745 11/22/23 1800 11/22/23 2140 11/22/23 2146  BP: (!) 169/51 120/87 (!) 166/67   Pulse: 65 66 78   Resp:  16 18   Temp:  99 F (37.2 C) 98.7 F (37.1 C)   TempSrc:  Oral    SpO2: 94% 95% 97%   Weight:     53.3 kg  Height:    5' (1.524 m)    Physical Exam Vitals reviewed.  Constitutional:      General: She is not in acute distress. HENT:     Head: Normocephalic and atraumatic.  Eyes:     Extraocular Movements: Extraocular movements intact.  Cardiovascular:     Rate and Rhythm: Normal rate and regular rhythm.     Heart sounds: Normal heart sounds.  Pulmonary:     Effort: Pulmonary effort is normal. No respiratory distress.     Breath sounds: Normal breath sounds. No wheezing, rhonchi or rales.  Abdominal:     General: Bowel sounds are normal. There  is no distension.     Palpations: Abdomen is soft.     Tenderness: There is no abdominal tenderness. There is no guarding.  Musculoskeletal:     Cervical back: Normal range of motion.     Right lower leg: No edema.     Left lower leg: No edema.     Comments: Large hematoma noted in the left hip/posterior/lateral thigh region  Skin:    General: Skin is warm and dry.  Neurological:     General: No focal deficit present.     Mental Status: She is alert and oriented to person, place, and time.     Labs on Admission: I have personally reviewed following labs and imaging studies  CBC: Recent Labs  Lab 11/22/23 1912  WBC 6.0  NEUTROABS 4.1  HGB 9.2*  HCT 28.7*  MCV 92.0  PLT 192   Basic Metabolic Panel: Recent Labs  Lab 11/22/23 1912  NA 139  K 4.1  CL 104  CO2 25  GLUCOSE 98  BUN 22  CREATININE 1.00  CALCIUM 9.3   GFR: Estimated Creatinine Clearance: 26.9 mL/min (by C-G formula based on SCr of 1 mg/dL). Liver Function Tests: Recent Labs  Lab 11/22/23 1912  AST 21  ALT 15  ALKPHOS 154*  BILITOT 0.6  PROT 5.6*  ALBUMIN 3.5   No results for input(s): LIPASE, AMYLASE in the last 168 hours. No results for input(s): AMMONIA in the last 168 hours. Coagulation Profile: No results for input(s): INR, PROTIME in the last 168 hours. Cardiac Enzymes: No results for input(s): CKTOTAL, CKMB,  CKMBINDEX, TROPONINI in the last 168 hours. BNP (last 3 results) No results for input(s): PROBNP in the last 8760 hours. HbA1C: No results for input(s): HGBA1C in the last 72 hours. CBG: No results for input(s): GLUCAP in the last 168 hours. Lipid Profile: No results for input(s): CHOL, HDL, LDLCALC, TRIG, CHOLHDL, LDLDIRECT in the last 72 hours. Thyroid  Function Tests: No results for input(s): TSH, T4TOTAL, FREET4, T3FREE, THYROIDAB in the last 72 hours. Anemia Panel: No results for input(s): VITAMINB12, FOLATE, FERRITIN, TIBC, IRON, RETICCTPCT in the last 72 hours. Urine analysis:    Component Value Date/Time   COLORURINE YELLOW 10/07/2023 1135   APPEARANCEUR HAZY (A) 10/07/2023 1135   APPEARANCEUR Clear 04/21/2023 1100   LABSPEC 1.016 10/07/2023 1135   PHURINE 7.0 10/07/2023 1135   GLUCOSEU NEGATIVE 10/07/2023 1135   HGBUR NEGATIVE 10/07/2023 1135   BILIRUBINUR NEGATIVE 10/07/2023 1135   BILIRUBINUR Negative 04/21/2023 1100   KETONESUR NEGATIVE 10/07/2023 1135   PROTEINUR NEGATIVE 10/07/2023 1135   NITRITE NEGATIVE 10/07/2023 1135   LEUKOCYTESUR NEGATIVE 10/07/2023 1135    Radiological Exams on Admission: DG Femur Portable Min 2 Views Left Result Date: 11/22/2023 CLINICAL DATA:  Pubic rami fractures. EXAM: LEFT FEMUR PORTABLE 2 VIEWS COMPARISON:  Right hip x-ray 11/22/2023. FINDINGS: There is no evidence of fracture or other focal bone lesions. Peripheral vascular calcifications are present. Soft tissues are otherwise within normal limits. There are mild degenerative changes of the medial compartment of the knee. IMPRESSION: 1. No acute fracture or dislocation. 2. Peripheral vascular disease. Electronically Signed   By: Greig Pique M.D.   On: 11/22/2023 17:29   DG HIP UNILAT W OR W/O PELVIS 2-3 VIEWS LEFT Result Date: 11/22/2023 CLINICAL DATA:  Left hip pain after fall. EXAM: DG HIP (WITH OR WITHOUT PELVIS) 2-3V*L* COMPARISON:  October 07, 2023. FINDINGS: Moderately displaced fractures are seen involving the medial portions of the bilateral  inferior and superior pubic rami. No fracture or dislocation is seen involving the proximal femurs. Mild degenerative change of left hip is noted. IMPRESSION: Moderately displaced bilateral inferior and superior pubic rami fractures. 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 Electronically Signed   By: Lynwood Landy Raddle M.D.   On: 11/22/2023 15:40    EKG: Pending at this time.  Assessment and Plan  Moderately displaced bilateral inferior and superior pubic rami fractures Secondary to a mechanical fall 3 days ago.  Continue nonoperative management with pain control, weightbearing as tolerated with walker/early ambulation, and PT/OT as recommended by orthopedics.  She will likely need SNF placement as she lives alone.  Patient does not want any IV pain medications.  Ordered Tylenol  PRN, Oxy IR PRN.  Chronic normocytic anemia Hemoglobin 9.2 (baseline 11-12).  Patient has a large hematoma in her left hip/lateral/posterior thigh region.  She is not endorsing any symptoms of GI bleed or any other bleeding.  Continue to monitor H&H.  Paroxysmal A-fib/flutter: Rate controlled and EKG pending.  Not on anticoagulation. Asthma: Stable, no signs of acute exacerbation.  Albuterol  PRN. Hypertension: Most recent SBP in the 160s.  IV hydralazine  PRN SBP >160. Hyperlipidemia GERD Pharmacy med rec pending.  DVT prophylaxis: SCDs Code Status: Discussed CODE STATUS with the patient and she wishes to be FULL CODE. Family Communication: No family available at this time. Level of care: Med-Surg Admission status: It is my clinical opinion that admission to INPATIENT is reasonable and necessary because of the expectation that this patient will require hospital care that crosses at least 2 midnights to treat this condition based on the medical complexity of the problems presented.  Given the aforementioned  information, the predictability of an adverse outcome is felt to be significant.  Editha Ram MD [facility]  If 7PM-7AM, please contact night-coverage [email or web address]  11/22/2023, 9:56 PM

## 2023-11-22 NOTE — Patient Instructions (Signed)
 Simple Pelvic Fracture, Adult A pelvic fracture is a break in one of the bones in the pelvis. The pelvic bones include the bones that you sit on and the bones that make up the lower part of your spine. A pelvic fracture is called "simple" if: There is only one break. The broken bone is stable and is not moving out of place. The bone does not pierce the skin. A pelvic fracture may occur along with injuries to nerves, blood vessels, soft tissues, the urinary tract, and abdominal organs. What are the causes? Common causes of this type of fracture include: A fall. A motor vehicle collision. Force or pressure that hits the pelvis. What increases the risk? You are more likely to get this injury if you: Play high-impact sports, such as football or hockey. Have thinning or weakening of your bones, such as from osteopenia or osteoporosis. Have cancer that has spread to the bone. Have a condition that is associated with falling, such as Parkinson's disease or a seizure disorder. Have had a stroke. Smoke. What are the signs or symptoms? Signs and symptoms may include: Tenderness, swelling, or bruising in the affected area. Pain when moving the hip. Pain when walking or standing. How is this diagnosed? This condition is diagnosed with a physical exam, X-ray, or CT scan. You may also have blood or urine tests: To rule out damage to other organs, such as the urethra. To check for internal bleeding in the pelvic area. How is this treated? The goal of treatment is to get the bone to heal in its original position. Treatment includes: Using crutches, a walker, or a wheelchair until the bone heals. Medicines to treat pain. Medicines to prevent blood clots from forming in your legs. Physical therapy. Follow these instructions at home: Medicines Take over-the-counter and prescription medicines only as told by your health care provider. Ask your health care provider if the medicine prescribed to you  requires you to avoid driving or using machinery. Managing pain, stiffness, and swelling  If directed, put ice on the injured area. To do this: Put ice in a plastic bag. Place a towel between your skin and the bag. Leave the ice on for 20 minutes, 2-3 times a day. Remove the ice if your skin turns bright red. This is very important. If you cannot feel pain, heat, or cold, you have a greater risk of damage to the area. Move your toes, ankles, knees, and hips often to reduce stiffness and swelling. Activity Follow your health care provider's instructions about putting weight on your legs. Avoid strenuous activities for as long as directed by your health care provider. Return to your normal activities as told by your health care provider. Ask your health care provider what activities are safe for you. Use items to help you with your activities, such as: A long-handled shoehorn to help you put your shoes on. Elastic shoelaces that do not need to be retied. A reacher or grabber to pick items up off the floor or from a shelf. General instructions  Do not  drive or use machinery until your health care provider tells you it is safe. Use a wheelchair or other assistive devices as directed by your health care provider. When you are ready to walk, start by using crutches or a walker to help support your body weight. Have someone help you at home as you recover. Wear compression stockings as told by your health care provider. Do not use any products that contain nicotine  or tobacco. These products include cigarettes, chewing tobacco, and vaping devices, such as e-cigarettes. If you need help quitting, ask your health care provider. If you have an underlying condition that caused your pelvic fracture, work with your health care provider to manage your condition. Keep all follow-up visits. This is important. Contact a health care provider if: Your pain gets worse. Your pain is not relieved with  medicines. You have difficulty or increased pain with walking. Get help right away if: You feel light-headed or faint. You develop chest pain. You develop shortness of breath. You have a fever. You have blood in your urine or your stools. You have bleeding in your vagina. You have difficulty or pain with urination or with passing stool. You have new or increased swelling in one of your legs. You have numbness in your legs or groin area. These symptoms may be an emergency. Get help right away. Call 911. Do not wait to see if the symptoms will go away. Do not drive yourself to the hospital. Summary A pelvic fracture is a break in one of the bones in the pelvis. These are the bones that you sit on and the bones that make up the lower part of your spine. A pelvic fracture is called "simple" if there is only one break, the broken bone is stable and not moving out of place, and the bone does not pierce the skin. Common causes of this type of fracture include a fall, a motor vehicle collision, or a force or pressure that hits the pelvis. The goal of treatment is to get the bone to heal in its original position. Treatment includes limiting the weight that you put on your affected leg. You may have to use a wheelchair, crutches, or a walker until your bone heals. Other treatments include physical therapy and medicines to treat pain and prevent blood clots. This information is not intended to replace advice given to you by your health care provider. Make sure you discuss any questions you have with your health care provider. Document Revised: 01/13/2021 Document Reviewed: 01/13/2021 Elsevier Patient Education  2024 [employer].

## 2023-11-22 NOTE — ED Triage Notes (Signed)
 Patient fell 3 days ago. She reports her PCP told her she has a left hip fracture. She is able to bear some weight but only short period of time.

## 2023-11-23 DIAGNOSIS — S32509A Unspecified fracture of unspecified pubis, initial encounter for closed fracture: Secondary | ICD-10-CM

## 2023-11-23 LAB — HEMOGLOBIN AND HEMATOCRIT, BLOOD
HCT: 30.2 % — ABNORMAL LOW (ref 36.0–46.0)
Hemoglobin: 9.2 g/dL — ABNORMAL LOW (ref 12.0–15.0)

## 2023-11-23 MED ORDER — AMIODARONE HCL 100 MG PO TABS
100.0000 mg | ORAL_TABLET | Freq: Every day | ORAL | Status: DC
  Filled 2023-11-23 (×2): qty 1

## 2023-11-23 MED ORDER — ORAL CARE MOUTH RINSE
15.0000 mL | OROMUCOSAL | Status: DC | PRN
Start: 2023-11-23 — End: 2023-11-24

## 2023-11-23 NOTE — Plan of Care (Signed)
   Problem: Education: Goal: Knowledge of General Education information will improve Description: Including pain rating scale, medication(s)/side effects and non-pharmacologic comfort measures Outcome: Progressing   Problem: Clinical Measurements: Goal: Ability to maintain clinical measurements within normal limits will improve Outcome: Progressing   Problem: Coping: Goal: Level of anxiety will decrease Outcome: Progressing   Problem: Elimination: Goal: Will not experience complications related to urinary retention Outcome: Progressing   Problem: Safety: Goal: Ability to remain free from injury will improve Outcome: Progressing

## 2023-11-23 NOTE — Evaluation (Signed)
 Physical Therapy Evaluation Patient Details Name: Heidi Dorsey MRN: [id number] DOB: 1930-08-24 Today's Date: 11/23/2023  History of Present Illness  89 y.o.female presenting with a chief complaint of pelvic pain. x-rays done which showed moderately displaced bilateral inferior and superior pubic rami fractures and no acute left hip fracture or dislocation.  ortho consulted and recommended nonoperative management with pain control, weightbearing as tolerated.  PMH: paroxysmal A-fib/flutter not on anticoagulation, mild aortic regurgitation, asthma, hypertension, hyperlipidemia, CKD stage II, chronic hyponatremia, chronic anemia, nephrolithiasis, migraine headaches, osteoarthritis, osteopenia, prediabetes, Raynaud's syndrome, sciatica, spinal stenosis, vertebrobasilar artery syndrome, sleep apnea, BPPV, diverticulosis, GERD, hiatal hernia  Clinical Impression  Pt admitted with above diagnosis.  Pt pleasant and cooperative, inaccurate hx regarding PLOF, states she amb with RW at home. Pt presents today with decr safety awareness, incr pain and generalized weakness LLE >RLE.   Pt currently with functional limitations due to the deficits listed below (see PT Problem List). Pt will benefit from acute skilled PT to increase their independence and safety with mobility to allow discharge.    Patient will benefit from continued inpatient follow up therapy, <3 hours/day post acute           If plan is discharge home, recommend the following: A little help with walking and/or transfers;A little help with bathing/dressing/bathroom;Help with stairs or ramp for entrance;Assist for transportation;Assistance with cooking/housework   Can travel by private vehicle   Yes    Equipment Recommendations None recommended by PT  Recommendations for Other Services       Functional Status Assessment Patient has had a recent decline in their functional status and demonstrates the ability to make significant  improvements in function in a reasonable and predictable amount of time.     Precautions / Restrictions Precautions Precautions: Fall      Mobility  Bed Mobility               General bed mobility comments: pt in recliner and returned to same    Transfers Overall transfer level: Needs assistance Equipment used: Rolling walker (2 wheels) Transfers: Sit to/from Stand Sit to Stand: Min assist, +2 safety/equipment, +2 physical assistance           General transfer comment: cues for hand placement and safety    Ambulation/Gait Ambulation/Gait assistance: Min assist Gait Distance (Feet): 15 Feet (x2) Assistive device: Rolling walker (2 wheels) Gait Pattern/deviations: Step-to pattern, Decreased step length - left, Decreased stance time - right, Knee flexed in stance - left, Trunk flexed       General Gait Details: cues for step length, proximity to RW, light assist to steady and manage RW  Stairs            Wheelchair Mobility     Tilt Bed    Modified Rankin (Stroke Patients Only)       Balance Overall balance assessment: History of Falls, Needs assistance Sitting-balance support: Feet supported Sitting balance-Leahy Scale: Good Sitting balance - Comments: pt is able to wt shift to manage sock position bil LEs, perform pericare   Standing balance support: During functional activity, Reliant on assistive device for balance Standing balance-Leahy Scale: Poor Standing balance comment: reliant on UEs for gait and at least unilteral UE  support on sink  while hand washing                             Pertinent Vitals/Pain Pain Assessment Pain Assessment: Faces Faces  Pain Scale: Hurts even more Pain Location: L hip/groin Pain Intervention(s): Limited activity within patient's tolerance, Monitored during session, Repositioned    Home Living Family/patient expects to be discharged to:: Skilled nursing facility Living Arrangements: Alone                Home Equipment: Agricultural consultant (2 wheels) Additional Comments: pt not an accurate historian regarding PLOF    Prior Function Prior Level of Function : Patient poor historian/Family not available             Mobility Comments: amb with RW per pt       Extremity/Trunk Assessment        Lower Extremity Assessment Lower Extremity Assessment: LLE deficits/detail;RLE deficits/detail RLE Deficits / Details: grossly 3+ to 4/5; AROM WFL LLE Deficits / Details: grossly 3/5 LLE: Unable to fully assess due to pain LLE Coordination: decreased fine motor       Communication   Communication Communication: No apparent difficulties    Cognition Arousal: Alert Behavior During Therapy: WFL for tasks assessed/performed   PT - Cognitive impairments: No family/caregiver present to determine baseline, Sequencing, Safety/Judgement, Memory                       PT - Cognition Comments: pt converses appropriately, follows one step commands - repetitious cues needed  at times. Following commands: Intact       Cueing Cueing Techniques: Verbal cues, Visual cues     General Comments      Exercises     Assessment/Plan    PT Assessment Patient needs continued PT services  PT Problem List Decreased strength;Decreased range of motion;Decreased activity tolerance;Decreased balance;Decreased knowledge of use of DME;Decreased mobility;Pain;Decreased safety awareness;Decreased cognition       PT Treatment Interventions DME instruction;Therapeutic exercise;Gait training;Functional mobility training;Therapeutic activities;Patient/family education;Balance training    PT Goals (Current goals can be found in the Care Plan section)  Acute Rehab PT Goals PT Goal Formulation: With patient Time For Goal Achievement: 12/07/23 Potential to Achieve Goals: Good    Frequency Min 3X/week     Co-evaluation PT/OT/SLP Co-Evaluation/Treatment: Yes Reason for Co-Treatment:  To address functional/ADL transfers PT goals addressed during session: Mobility/safety with mobility OT goals addressed during session: ADL's and self-care       AM-PAC PT 6 Clicks Mobility  Outcome Measure Help needed turning from your back to your side while in a flat bed without using bedrails?: A Little Help needed moving from lying on your back to sitting on the side of a flat bed without using bedrails?: A Little Help needed moving to and from a bed to a chair (including a wheelchair)?: A Little Help needed standing up from a chair using your arms (e.g., wheelchair or bedside chair)?: A Little Help needed to walk in hospital room?: A Little Help needed climbing 3-5 steps with a railing? : A Lot 6 Click Score: 17    End of Session Equipment Utilized During Treatment: Gait belt Activity Tolerance: Patient tolerated treatment well Patient left: with call bell/phone within reach;in chair;with chair alarm set Nurse Communication: Mobility status PT Visit Diagnosis: Other abnormalities of gait and mobility (R26.89)    Time: 8650-8582 PT Time Calculation (min) (ACUTE ONLY): 28 min   Charges:   PT Evaluation $PT Eval Low Complexity: 1 Low   PT General Charges $$ ACUTE PT VISIT: 1 Visit         Rexene, PT  Acute Rehab Dept ([facility]) [phone]  11/23/2023   [facility] 11/23/2023, 2:31 PM

## 2023-11-23 NOTE — Evaluation (Signed)
 Occupational Therapy Evaluation Patient Details Name: Heidi Dorsey MRN: [id number] DOB: 08/28/1930 Today's Date: 11/23/2023   History of Present Illness   89 y.o.female presenting with a chief complaint of pelvic pain. x-rays done which showed moderately displaced bilateral inferior and superior pubic rami fractures and no acute left hip fracture or dislocation.  ortho consulted and recommended nonoperative management with pain control, weightbearing as tolerated.  PMH: paroxysmal A-fib/flutter not on anticoagulation, mild aortic regurgitation, asthma, hypertension, hyperlipidemia, CKD stage II, chronic hyponatremia, chronic anemia, nephrolithiasis, migraine headaches, osteoarthritis, osteopenia, prediabetes, Raynaud's syndrome, sciatica, spinal stenosis, vertebrobasilar artery syndrome, sleep apnea, BPPV, diverticulosis, GERD, hiatal hernia     Clinical Impressions Pt presents with decline in function and safety with ADLs and ADL mobility with impaired strength, balance, endurance and cognition/safety awareness. PTA pt lives at an ILF or ALF (pt is an inaccurate historian) apartment and reports that she was Ind with ADLs/selfcare and used a RW for mobility; walks to a dining room for meals. Pt currently requires CGA wit UB ADLs, mod A with LB ADLs, min A +2 for mobility and transfers using RW. OT will follow acutely to maximize level of function and safety     If plan is discharge home, recommend the following:   A lot of help with bathing/dressing/bathroom;A little help with walking and/or transfers;Direct supervision/assist for financial management;Supervision due to cognitive status;Assist for transportation;Direct supervision/assist for medications management;Help with stairs or ramp for entrance     Functional Status Assessment   Patient has had a recent decline in their functional status and demonstrates the ability to make significant improvements in function in a reasonable  and predictable amount of time.     Equipment Recommendations   Other (comment) (defer)     Recommendations for Other Services         Precautions/Restrictions   Precautions Precautions: Fall Restrictions Weight Bearing Restrictions Per Provider Order: No Other Position/Activity Restrictions: WBAT bil LEs     Mobility Bed Mobility               General bed mobility comments: pt in recliner and returned to same    Transfers Overall transfer level: Needs assistance Equipment used: Rolling walker (2 wheels) Transfers: Sit to/from Stand Sit to Stand: Min assist, +2 safety/equipment, +2 physical assistance           General transfer comment: cues for hand placement and safety      Balance Overall balance assessment: History of Falls, Needs assistance Sitting-balance support: Feet supported Sitting balance-Leahy Scale: Good Sitting balance - Comments: pt is able to wt shift to manage sock position bil LEs, perform pericare   Standing balance support: During functional activity, Reliant on assistive device for balance Standing balance-Leahy Scale: Poor                             ADL either performed or assessed with clinical judgement   ADL Overall ADL's : Needs assistance/impaired Eating/Feeding: Independent;Sitting   Grooming: Wash/dry hands;Wash/dry face;Standing;Contact guard assist   Upper Body Bathing: Contact guard assist;Sitting   Lower Body Bathing: Moderate assistance;Sitting/lateral leans;Sit to/from stand   Upper Body Dressing : Contact guard assist;Sitting   Lower Body Dressing: Moderate assistance;Sitting/lateral leans;Sit to/from stand   Toilet Transfer: Minimal assistance;+2 for physical assistance;+2 for safety/equipment;Cueing for safety;Rolling walker (2 wheels);Regular Toilet;Grab bars   Toileting- Clothing Manipulation and Hygiene: Minimal assistance;Sit to/from stand       Functional mobility  during ADLs:  Minimal assistance;+2 for physical assistance;+2 for safety/equipment;Rolling walker (2 wheels);Cueing for safety       Vision Baseline Vision/History: 1 Wears glasses Ability to See in Adequate Light: 0 Adequate Patient Visual Report: No change from baseline       Perception         Praxis         Pertinent Vitals/Pain Pain Assessment Pain Assessment: Faces Faces Pain Scale: Hurts even more Pain Location: L hip/groin Pain Descriptors / Indicators: Grimacing, Discomfort Pain Intervention(s): Limited activity within patient's tolerance, Monitored during session, Repositioned     Extremity/Trunk Assessment Upper Extremity Assessment Upper Extremity Assessment: Generalized weakness;Right hand dominant   Lower Extremity Assessment Lower Extremity Assessment: Defer to PT evaluation RLE Deficits / Details: grossly 3+ to 4/5; AROM WFL LLE Deficits / Details: grossly 3/5 LLE: Unable to fully assess due to pain LLE Coordination: decreased fine motor       Communication Communication Communication: No apparent difficulties   Cognition Arousal: Alert Behavior During Therapy: WFL for tasks assessed/performed Cognition: No family/caregiver present to determine baseline                               Following commands: Intact       Cueing  General Comments   Cueing Techniques: Verbal cues;Visual cues      Exercises     Shoulder Instructions      Home Living Family/patient expects to be discharged to:: Skilled nursing facility Living Arrangements: Alone                           Home Equipment: Rolling Walker (2 wheels)   Additional Comments: pt not an accurate historian regarding PLOF      Prior Functioning/Environment Prior Level of Function : Patient poor historian/Family not available             Mobility Comments: amb with RW per pt ADLs Comments: Ind with ADLs/selfcare, walks to a dining room    OT Problem List:  Decreased activity tolerance;Impaired balance (sitting and/or standing);Decreased cognition;Decreased safety awareness;Decreased knowledge of use of DME or AE;Pain   OT Treatment/Interventions: Self-care/ADL training;Patient/family education;Therapeutic exercise;Balance training;Therapeutic activities;DME and/or AE instruction      OT Goals(Current goals can be found in the care plan section)   Acute Rehab OT Goals Patient Stated Goal: get better OT Goal Formulation: With patient Time For Goal Achievement: 12/07/23 Potential to Achieve Goals: Good ADL Goals Pt Will Perform Grooming: with supervision;with set-up;standing Pt Will Perform Upper Body Bathing: with supervision;with set-up;sitting Pt Will Perform Lower Body Bathing: with min assist;sitting/lateral leans;sit to/from stand Pt Will Perform Upper Body Dressing: with supervision;with set-up;sitting Pt Will Transfer to Toilet: with min assist;with contact guard assist;ambulating;regular height toilet;grab bars Pt Will Perform Toileting - Clothing Manipulation and hygiene: with contact guard assist;with supervision;sitting/lateral leans;sit to/from stand   OT Frequency:  Min 2X/week    Co-evaluation PT/OT/SLP Co-Evaluation/Treatment: Yes Reason for Co-Treatment: To address functional/ADL transfers PT goals addressed during session: Mobility/safety with mobility OT goals addressed during session: Proper use of Adaptive equipment and DME;ADL's and self-care      AM-PAC OT 6 Clicks Daily Activity     Outcome Measure Help from another person eating meals?: None Help from another person taking care of personal grooming?: A Little Help from another person toileting, which includes using toliet, bedpan, or urinal?: A Little Help  from another person bathing (including washing, rinsing, drying)?: A Lot Help from another person to put on and taking off regular upper body clothing?: A Little Help from another person to put on and  taking off regular lower body clothing?: A Lot 6 Click Score: 17   End of Session Equipment Utilized During Treatment: Gait belt;Rolling walker (2 wheels)  Activity Tolerance: Patient tolerated treatment well Patient left: in chair;with call bell/phone within reach;with chair alarm set;with nursing/sitter in room  OT Visit Diagnosis: Other abnormalities of gait and mobility (R26.89);History of falling (Z91.81);Pain;Muscle weakness (generalized) (M62.81) Pain - part of body: Leg                Time: 1352-1417 OT Time Calculation (min): 25 min Charges:  OT General Charges $OT Visit: 1 Visit OT Evaluation $OT Eval Moderate Complexity: 1 Mod    Jacques Karna Loose 11/23/2023, 2:51 PM

## 2023-11-23 NOTE — NC FL2 (Signed)
 [place]  MEDICAID FL2 LEVEL OF CARE FORM     IDENTIFICATION  Patient Name: Heidi Dorsey Birthdate: Apr 26, 1930 Sex: female Admission Date (Current Location): 11/22/2023  [facility] and [place] Number:  Producer, television/film/video and Address:  [facility],  [address] [place], [place] [address]      Provider Number: [id number]  Attending Physician Name and Address:  Dino Antu, MD  Relative Name and Phone Number:  darden Tonie Gravely @ [phone]    Current Level of Care: Hospital Recommended Level of Care: Skilled Nursing Facility Prior Approval Number:    Date Approved/Denied:   PASRR Number: [id number]  Discharge Plan: SNF    Current Diagnoses: Patient Active Problem List   Diagnosis Date Noted   Pelvic fracture (HCC) 11/22/2023   Murmur 02/27/2023   Sleep disturbance 01/21/2023   Unstable gait 01/21/2023   Bradycardia 11/06/2022   Atrial flutter with rapid ventricular response (HCC) 10/17/2022   Cognitive changes 10/17/2022   Hyponatremia 10/17/2022   PAF (paroxysmal atrial fibrillation) (HCC) 09/26/2022   Nocturia more than twice per night 05/20/2022   Gait abnormality 02/24/2021   Left hand paresthesia 02/24/2021   Neck pain 02/24/2021   Xerosis cutis 09/15/2020   Senile cataract 09/15/2020   Rosacea 09/15/2020   Preglaucoma 09/15/2020   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/15/2020   History of hemorrhoids 09/15/2020   History of postmenopausal HRT 09/15/2020   Asthma, well controlled 09/15/2020   LBBB (left bundle branch block) 08/19/2020   Allergic rhinitis 05/14/2020   DDD (degenerative disc disease), lumbar 05/14/2020   Diverticulosis 05/14/2020   GERD (gastroesophageal reflux disease) 05/14/2020   Hypercholesterolemia 05/14/2020   Lumbar stenosis 05/14/2020   Migraine with aura and without status migrainosus, not intractable 05/14/2020   Osteoarthritis 05/14/2020   Osteopenia 05/14/2020   Primary insomnia 12/18/2019    Paresthesia and pain of left extremity 12/18/2019   Stage 3a chronic kidney disease (HCC) 08/23/2019   Left leg numbness 02/02/2018   Imbalance 02/02/2018   Dizziness 02/02/2018   Chronic pain of left knee 12/20/2017   Primary osteoarthritis of left knee 12/20/2017   Heart palpitations 02/16/2016   Raynaud disease 10/27/2015   Chest pain with high risk for cardiac etiology 06/11/2015   Numbness 02/07/2015   Essential hypertension 04/16/2014   Blurred vision, bilateral 04/16/2014   Hip pain 07/23/2013   Thoracic or lumbosacral neuritis or radiculitis, unspecified 07/23/2013   Cystitis, chronic 01/10/2013   Rectocele 11/04/2011   Vaginal atrophy 10/07/2011   BPPV (benign paroxysmal positional vertigo) 06/30/2011   Pre-diabetes 03/29/2011    Orientation RESPIRATION BLADDER Height & Weight     Self, Place  Normal Continent Weight: 117 lb 8.1 oz (53.3 kg) Height:  5' (152.4 cm)  BEHAVIORAL SYMPTOMS/MOOD NEUROLOGICAL BOWEL NUTRITION STATUS      Continent Diet (regular)  AMBULATORY STATUS COMMUNICATION OF NEEDS Skin   Limited Assist Verbally Normal                       Personal Care Assistance Level of Assistance  Bathing, Feeding, Dressing Bathing Assistance: Limited assistance Feeding assistance: Limited assistance Dressing Assistance: Limited assistance     Functional Limitations Info  Sight, Hearing, Speech Sight Info: Adequate Hearing Info: Adequate Speech Info: Adequate    SPECIAL CARE FACTORS FREQUENCY  PT (By licensed PT), OT (By licensed OT)     PT Frequency: 5x/wk OT Frequency: 5x/wk            Contractures Contractures  Info: Not present    Additional Factors Info  Allergies, Code Status Code Status Info: Full Allergies Info: Allegra (Fexofenadine), Estrace (Estradiol), Miacalcin (Calcitonin), Nsaids, Benadryl (Diphenhydramine), Atrovent Hfa (Ipratropium Bromide Hfa), Paraphenylenediamine, Augmentin (Amoxicillin -pot Clavulanate), Biaxin  (Clarithromycin), Cipro (Ciprofloxacin Hcl), Codeine, Epipen (Epinephrine), Erythromycin, Flonase (Fluticasone), Latex, Macrobid (Nitrofurantoin), Other, Silenor  (Doxepin  Hcl), Sulfa Antibiotics, Sumycin (Tetracycline), Zestril (Lisinopril)           Current Medications (11/23/2023):  This is the current hospital active medication list Current Facility-Administered Medications  Medication Dose Route Frequency Provider Last Rate Last Admin   acetaminophen  (TYLENOL ) tablet 650 mg  650 mg Oral Q6H PRN Rathore, Vasundhra, MD   650 mg at 11/23/23 9344   Or   acetaminophen  (TYLENOL ) suppository 650 mg  650 mg Rectal Q6H PRN Alfornia Madison, MD       albuterol  (VENTOLIN  HFA) 108 (90 Base) MCG/ACT inhaler 1-2 puff  1-2 puff Inhalation Q6H PRN Alfornia Madison, MD       amiodarone  (PACERONE ) tablet 100 mg  100 mg Oral Daily Rathore, Vasundhra, MD       hydrALAZINE  (APRESOLINE ) injection 5 mg  5 mg Intravenous Q6H PRN Alfornia Madison, MD   5 mg at 11/23/23 0014   Oral care mouth rinse  15 mL Mouth Rinse PRN Dino Antu, MD       oxyCODONE  (Oxy IR/ROXICODONE ) immediate release tablet 5 mg  5 mg Oral Q6H PRN Alfornia Madison, MD         Discharge Medications: Please see discharge summary for a list of discharge medications.  Relevant Imaging Results:  Relevant Lab Results:   Additional Information SS# [id number]  NORMAN ASPEN, LCSW

## 2023-11-23 NOTE — TOC Initial Note (Signed)
 Transition of Care [facility]) - Initial/Assessment Note    Patient Details  Name: Heidi Dorsey MRN: [id number] Date of Birth: 10/17/1930  Transition of Care [facility]) CM/SW Contact:    NORMAN ASPEN, LCSW Phone Number: 11/23/2023, 3:30 PM  Clinical Narrative:                  Have spoken with pt and grandson, Tonie Gravely, this afternoon to review PT recommendations for SNF rehab.  Both are agreeable with this plan.  Pt pleasant and engaged, however, with some memory deficits.  Darden confirms that she has been a resident in the memory care unit at [facility]) for ~ 8 months.  He does feel that her current mobility limitations will need a higher acuity of care initially with hope to return to memory care.  Pt/family preference would be [facility] and have sent referral and alerted admissions coordinator.  Await answer if can make SNF bed offer.    Expected Discharge Plan: Skilled Nursing Facility Barriers to Discharge: SNF Pending bed offer   Patient Goals and CMS Choice Patient states their goals for this hospitalization and ongoing recovery are:: return to Memory care at [facility]          Expected Discharge Plan and Services In-house Referral: Clinical Social Work   Post Acute Care Choice: Skilled Nursing Facility Living arrangements for the past 2 months:  (Memory Care at [facility])                 DME Arranged: N/A DME Agency: NA                  Prior Living Arrangements/Services Living arrangements for the past 2 months:  (Memory Care at [facility]) Lives with:: Facility Resident Patient language and need for interpreter reviewed:: Yes Do you feel safe going back to the place where you live?: Yes      Need for Family Participation in Patient Care: No (Comment) Care giver support system in place?: Yes (comment)   Criminal Activity/Legal Involvement Pertinent to Current Situation/Hospitalization: No - Comment as needed  Activities of  Daily Living   ADL Screening (condition at time of admission) Independently performs ADLs?: No Does the patient have a NEW difficulty with bathing/dressing/toileting/self-feeding that is expected to last >3 days?: Yes (Initiates electronic notice to provider for possible OT consult) Does the patient have a NEW difficulty with getting in/out of bed, walking, or climbing stairs that is expected to last >3 days?: Yes (Initiates electronic notice to provider for possible PT consult) Does the patient have a NEW difficulty with communication that is expected to last >3 days?: No Is the patient deaf or have difficulty hearing?: No Does the patient have difficulty seeing, even when wearing glasses/contacts?: No Does the patient have difficulty concentrating, remembering, or making decisions?: Yes  Permission Sought/Granted Permission sought to share information with : Family Supports Permission granted to share information with : Yes, Verbal Permission Granted  Share Information with NAME: darden Tonie Gravely @ [phone]           Emotional Assessment Appearance:: Appears stated age Attitude/Demeanor/Rapport: Gracious, Engaged Affect (typically observed): Accepting Orientation: : Oriented to Self, Oriented to Place Alcohol / Substance Use: Not Applicable Psych Involvement: No (comment)  Admission diagnosis:  Pelvic fracture (HCC) [S32.9XXA] Patient Active Problem List   Diagnosis Date Noted   Pelvic fracture (HCC) 11/22/2023   Murmur 02/27/2023   Sleep disturbance 01/21/2023   Unstable gait 01/21/2023  Bradycardia 11/06/2022   Atrial flutter with rapid ventricular response (HCC) 10/17/2022   Cognitive changes 10/17/2022   Hyponatremia 10/17/2022   PAF (paroxysmal atrial fibrillation) (HCC) 09/26/2022   Nocturia more than twice per night 05/20/2022   Gait abnormality 02/24/2021   Left hand paresthesia 02/24/2021   Neck pain 02/24/2021   Xerosis cutis 09/15/2020   Senile  cataract 09/15/2020   Rosacea 09/15/2020   Preglaucoma 09/15/2020   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/15/2020   History of hemorrhoids 09/15/2020   History of postmenopausal HRT 09/15/2020   Asthma, well controlled 09/15/2020   LBBB (left bundle branch block) 08/19/2020   Allergic rhinitis 05/14/2020   DDD (degenerative disc disease), lumbar 05/14/2020   Diverticulosis 05/14/2020   GERD (gastroesophageal reflux disease) 05/14/2020   Hypercholesterolemia 05/14/2020   Lumbar stenosis 05/14/2020   Migraine with aura and without status migrainosus, not intractable 05/14/2020   Osteoarthritis 05/14/2020   Osteopenia 05/14/2020   Primary insomnia 12/18/2019   Paresthesia and pain of left extremity 12/18/2019   Stage 3a chronic kidney disease (HCC) 08/23/2019   Left leg numbness 02/02/2018   Imbalance 02/02/2018   Dizziness 02/02/2018   Chronic pain of left knee 12/20/2017   Primary osteoarthritis of left knee 12/20/2017   Heart palpitations 02/16/2016   Raynaud disease 10/27/2015   Chest pain with high risk for cardiac etiology 06/11/2015   Numbness 02/07/2015   Essential hypertension 04/16/2014   Blurred vision, bilateral 04/16/2014   Hip pain 07/23/2013   Thoracic or lumbosacral neuritis or radiculitis, unspecified 07/23/2013   Cystitis, chronic 01/10/2013   Rectocele 11/04/2011   Vaginal atrophy 10/07/2011   BPPV (benign paroxysmal positional vertigo) 06/30/2011   Pre-diabetes 03/29/2011   PCP:  Severa Rock HERO, FNP Pharmacy:   [facility] [place], [place] - [address] [place] [address] Phone: [phone] Fax: [phone]     Social Drivers of Health (SDOH) Social History: SDOH Screenings   Food Insecurity: No Food Insecurity (11/22/2023)  Housing: Low Risk  (11/22/2023)  Transportation Needs: No Transportation Needs (11/22/2023)  Utilities: Not At Risk (11/22/2023)  Alcohol Screen: Low Risk  (12/19/2022)   Depression (PHQ2-9): Medium Risk (01/21/2023)  Financial Resource Strain: Low Risk  (12/19/2022)  Physical Activity: Inactive (12/19/2022)  Social Connections: Socially Isolated (11/22/2023)  Stress: No Stress Concern Present (12/19/2022)  Tobacco Use: Low Risk  (11/22/2023)  Health Literacy: Adequate Health Literacy (12/19/2022)   SDOH Interventions:     Readmission Risk Interventions     No data to display

## 2023-11-23 NOTE — Plan of Care (Signed)
  Problem: Education: Goal: Knowledge of General Education information will improve Description: Including pain rating scale, medication(s)/side effects and non-pharmacologic comfort measures Outcome: Progressing   Problem: Health Behavior/Discharge Planning: Goal: Ability to manage health-related needs will improve Outcome: Progressing   Problem: Clinical Measurements: Goal: Ability to maintain clinical measurements within normal limits will improve Outcome: Progressing Goal: Will remain free from infection Outcome: Progressing Goal: Diagnostic test results will improve Outcome: Progressing Goal: Respiratory complications will improve Outcome: Progressing Goal: Cardiovascular complication will be avoided Outcome: Progressing   Problem: Activity: Goal: Risk for activity intolerance will decrease Outcome: Progressing   Problem: Nutrition: Goal: Adequate nutrition will be maintained Outcome: Progressing   Problem: Coping: Goal: Level of anxiety will decrease Outcome: Progressing   Problem: Elimination: Goal: Will not experience complications related to bowel motility Outcome: Progressing Goal: Will not experience complications related to urinary retention Outcome: Completed/Met   Problem: Pain Managment: Goal: General experience of comfort will improve and/or be controlled Outcome: Progressing   Problem: Safety: Goal: Ability to remain free from injury will improve Outcome: Progressing   Problem: Skin Integrity: Goal: Risk for impaired skin integrity will decrease Outcome: Progressing

## 2023-11-23 NOTE — Progress Notes (Signed)
 PROGRESS NOTE    Heidi Dorsey  FMW:[id number] DOB: [birth year] DOA: 11/22/2023 PCP: Severa Rock HERO, FNP   Brief Narrative:   89 y.o. female with medical history significant of paroxysmal A-fib/flutter not on anticoagulation, mild aortic regurgitation, asthma, hypertension, hyperlipidemia, CKD stage II, chronic hyponatremia, chronic anemia, nephrolithiasis, migraine headaches, osteoarthritis, osteopenia, prediabetes, Raynaud's syndrome, sciatica, spinal stenosis, vertebrobasilar artery syndrome, sleep apnea, BPPV, diverticulosis, GERD, hiatal hernia presenting with a chief complaint of pelvic pain after fall.  She was found to have moderately displaced bilateral inferior and superior pubic rami fractures, Ortho consulted, no surgical intervention required.  Pending skilled nursing facility placement.  Assessment & Plan:  Principal Problem:   Pelvic fracture (HCC) Active Problems:   Essential hypertension   GERD (gastroesophageal reflux disease)   Hypercholesterolemia   PAF (paroxysmal atrial fibrillation) (HCC)    Moderately displaced bilateral inferior and superior pubic rami fractures, POA: Secondary to a mechanical fall 3-4 days ago.   Continue nonoperative management with pain control, weightbearing as tolerated with walker/early ambulation, and PT/OT as recommended by orthopedics.   She will likely need SNF placement as she lives alone in an apartment complex.   Patient does not want any IV pain medications.  Ordered Tylenol  PRN, Oxy IR PRN. Case management consulted for placement   Chronic normocytic anemia Hemoglobin 9.2 (baseline 11-12).  Patient has a large hematoma in her left hip/lateral/posterior thigh region.   She is not endorsing any symptoms of GI bleed or any other bleeding.  Continue to monitor H&H.   Paroxysmal A-fib/flutter: Rate controlled.Not on anticoagulation.  Asthma: Stable, no signs of acute exacerbation.  Albuterol  PRN.  Hypertension: Most recent  SBP in the 160s.  IV hydralazine  PRN SBP >160.  Hyperlipidemia: Not on any medications  GERD: No acute issues  Physical deconditioning: Leading to multiple falls.  PT/OT evaluation and skilled nursing facility placement on discharge.  Disposition: Skilled nursing facility  DVT prophylaxis: SCDs Start: 11/22/23 2301     Code Status: Full Code Family Communication:   Status is: Inpatient Remains inpatient appropriate because: Bilateral pubic rami fractures, pending PT/OT evaluation    Subjective:  She said that she was living at an apartment at [facility] apartment complex and fell.  She said that she cannot take care of herself and her daughter also has medical issues including 3 stents in her heart so she cannot take care of herself.  Patient needs skilled nursing facility/rehab placement.  Examination:  General exam: Appears calm and comfortable  Respiratory system: Clear to auscultation. Respiratory effort normal. Cardiovascular system: S1 & S2 heard, RRR. No JVD, murmurs, rubs, gallops or clicks. No pedal edema. Gastrointestinal system: Abdomen is nondistended, soft and nontender. No organomegaly or masses felt. Normal bowel sounds heard. Central nervous system: Alert and oriented. No focal neurological deficits. Extremities: Minimal movement of the lower extremity secondary to pubic rami fractures, evidence of hematoma in the left lateral posterior thigh region Skin: No rashes, lesions or ulcers Psychiatry: Judgement and insight appear normal. Mood & affect appropriate.     Diet Orders (From admission, onward)     Start     Ordered   11/22/23 2303  Diet Heart Room service appropriate? Yes; Fluid consistency: Thin  Diet effective now       Question Answer Comment  Room service appropriate? Yes   Fluid consistency: Thin      11/22/23 2304            Objective: Vitals:   11/22/23 2146  11/22/23 2337 11/23/23 0043 11/23/23 0540  BP:  (!) 167/70 (!) 164/60 (!)  157/52  Pulse:  65 (!) 57 (!) 58  Resp:   16 16  Temp:   98.1 F (36.7 C) 98.9 F (37.2 C)  TempSrc:    Oral  SpO2:   98% 98%  Weight: 53.3 kg     Height: 5' (1.524 m)       Intake/Output Summary (Last 24 hours) at 11/23/2023 0852 Last data filed at 11/23/2023 9371 Gross per 24 hour  Intake 180 ml  Output --  Net 180 ml   Filed Weights   11/22/23 2146  Weight: 53.3 kg    Scheduled Meds:  amiodarone   100 mg Oral Daily   Continuous Infusions:  Nutritional status     Body mass index is 22.95 kg/m.  Data Reviewed:   CBC: Recent Labs  Lab 11/22/23 1912 11/23/23 0328  WBC 6.0  --   NEUTROABS 4.1  --   HGB 9.2* 9.2*  HCT 28.7* 30.2*  MCV 92.0  --   PLT 192  --    Basic Metabolic Panel: Recent Labs  Lab 11/22/23 1912  NA 139  K 4.1  CL 104  CO2 25  GLUCOSE 98  BUN 22  CREATININE 1.00  CALCIUM 9.3   GFR: Estimated Creatinine Clearance: 26.9 mL/min (by C-G formula based on SCr of 1 mg/dL). Liver Function Tests: Recent Labs  Lab 11/22/23 1912  AST 21  ALT 15  ALKPHOS 154*  BILITOT 0.6  PROT 5.6*  ALBUMIN 3.5   No results for input(s): LIPASE, AMYLASE in the last 168 hours. No results for input(s): AMMONIA in the last 168 hours. Coagulation Profile: No results for input(s): INR, PROTIME in the last 168 hours. Cardiac Enzymes: No results for input(s): CKTOTAL, CKMB, CKMBINDEX, TROPONINI in the last 168 hours. BNP (last 3 results) No results for input(s): PROBNP in the last 8760 hours. HbA1C: No results for input(s): HGBA1C in the last 72 hours. CBG: No results for input(s): GLUCAP in the last 168 hours. Lipid Profile: No results for input(s): CHOL, HDL, LDLCALC, TRIG, CHOLHDL, LDLDIRECT in the last 72 hours. Thyroid  Function Tests: No results for input(s): TSH, T4TOTAL, FREET4, T3FREE, THYROIDAB in the last 72 hours. Anemia Panel: No results for input(s): VITAMINB12, FOLATE, FERRITIN,  TIBC, IRON, RETICCTPCT in the last 72 hours. Sepsis Labs: No results for input(s): PROCALCITON, LATICACIDVEN in the last 168 hours.  No results found for this or any previous visit (from the past 240 hours).       Radiology Studies: DG Femur Portable Min 2 Views Left Result Date: 11/22/2023 CLINICAL DATA:  Pubic rami fractures. EXAM: LEFT FEMUR PORTABLE 2 VIEWS COMPARISON:  Right hip x-ray 11/22/2023. FINDINGS: There is no evidence of fracture or other focal bone lesions. Peripheral vascular calcifications are present. Soft tissues are otherwise within normal limits. There are mild degenerative changes of the medial compartment of the knee. IMPRESSION: 1. No acute fracture or dislocation. 2. Peripheral vascular disease. Electronically Signed   By: Greig Pique M.D.   On: 11/22/2023 17:29   DG HIP UNILAT W OR W/O PELVIS 2-3 VIEWS LEFT Result Date: 11/22/2023 CLINICAL DATA:  Left hip pain after fall. EXAM: DG HIP (WITH OR WITHOUT PELVIS) 2-3V*L* COMPARISON:  October 07, 2023. FINDINGS: Moderately displaced fractures are seen involving the medial portions of the bilateral inferior and superior pubic rami. No fracture or dislocation is seen involving the proximal femurs. Mild degenerative change of left  hip is noted. IMPRESSION: Moderately displaced bilateral inferior and superior pubic rami fractures. 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 Electronically Signed   By: Lynwood Landy Raddle M.D.   On: 11/22/2023 15:40        LOS: 1 day   Time spent= 43 mins    Deliliah Room, MD [facility]  If 7PM-7AM, please contact night-coverage  11/23/2023, 8:52 AM

## 2023-11-24 ENCOUNTER — Ambulatory Visit: Payer: Self-pay | Admitting: Family

## 2023-11-24 DIAGNOSIS — I10 Essential (primary) hypertension: Secondary | ICD-10-CM | POA: Diagnosis not present

## 2023-11-24 DIAGNOSIS — S32509A Unspecified fracture of unspecified pubis, initial encounter for closed fracture: Secondary | ICD-10-CM | POA: Diagnosis not present

## 2023-11-24 DIAGNOSIS — K219 Gastro-esophageal reflux disease without esophagitis: Secondary | ICD-10-CM | POA: Diagnosis not present

## 2023-11-24 DIAGNOSIS — M6281 Muscle weakness (generalized): Secondary | ICD-10-CM

## 2023-11-24 DIAGNOSIS — E785 Hyperlipidemia, unspecified: Secondary | ICD-10-CM | POA: Diagnosis not present

## 2023-11-24 DIAGNOSIS — I480 Paroxysmal atrial fibrillation: Secondary | ICD-10-CM | POA: Diagnosis not present

## 2023-11-24 MED ORDER — OXYCODONE HCL 5 MG PO TABS: 5.0000 mg | ORAL_TABLET | Freq: Four times a day (QID) | ORAL | 0 refills | Status: AC | PRN

## 2023-11-24 NOTE — TOC Transition Note (Signed)
 Transition of Care [facility]) - Discharge Note   Patient Details  Name: Heidi Dorsey MRN: [id number] Date of Birth: [birth year]  [facility] of [facility]) CM/SW Contact:  NORMAN ASPEN, LCSW Phone Number: 11/24/2023, 11:19 AM   Clinical Narrative:    Pt is medically cleared for dc today to [facility] SNF.  Pt and grandson aware and agreeable.  PTAR called at 11:15am.  RN to call report to [phone].  No further TOC needs.   Final next level of care: Skilled Nursing Facility Barriers to Discharge: Barriers Resolved   Patient Goals and CMS Choice Patient states their goals for this hospitalization and ongoing recovery are:: return to Memory care at [facility]          Discharge Placement PASRR number recieved: 11/23/23            Patient chooses bed at: [facility] Patient to be transferred to facility by: PTAR Name of family member notified: grandson, Tonie Gravely Patient and family notified of of transfer: 11/24/23  Discharge Plan and Services Additional resources added to the After Visit Summary for   In-house Referral: Clinical Social Work   Post Acute Care Choice: Skilled Nursing Facility          DME Arranged: N/A DME Agency: NA                  Social Drivers of Health (SDOH) Interventions SDOH Screenings   Food Insecurity: No Food Insecurity (11/22/2023)  Housing: Low Risk  (11/22/2023)  Transportation Needs: No Transportation Needs (11/22/2023)  Utilities: Not At Risk (11/22/2023)  Alcohol Screen: Low Risk  (12/19/2022)  Depression (PHQ2-9): Medium Risk (01/21/2023)  Financial Resource Strain: Low Risk  (12/19/2022)  Physical Activity: Inactive (12/19/2022)  Social Connections: Socially Isolated (11/22/2023)  Stress: No Stress Concern Present (12/19/2022)  Tobacco Use: Low Risk  (11/22/2023)  Health Literacy: Adequate Health Literacy (12/19/2022)     Readmission Risk Interventions     No data to display

## 2023-11-24 NOTE — Progress Notes (Signed)
 Attempted to call report to [facility]. Call back number left with [facility]. Per Alan, will get a call when ready to receive report.

## 2023-11-24 NOTE — Discharge Summary (Signed)
 Physician Discharge Summary   Patient: Heidi Dorsey MRN: [id number] DOB: 1930/06/07  Admit date:     11/22/2023  Discharge date: 11/24/23  Discharge Physician: Deliliah Room   PCP: Severa Rock HERO, FNP   Recommendations at discharge:    Follow up outpatient with PCP in one week Out patient follow up with orthopedics Continue taking meds as prescribed  Discharge Diagnoses: Principal Problem:   Pelvic fracture (HCC) Active Problems:   Essential hypertension   GERD (gastroesophageal reflux disease)   Hypercholesterolemia   PAF (paroxysmal atrial fibrillation) [facility])   Hospital Course:  Moderately displaced bilateral inferior and superior pubic rami fractures, POA: Secondary to a mechanical fall 3-4 days ago.   Continue nonoperative management with pain control, weightbearing as tolerated with walker/early ambulation, and PT/OT as recommended by orthopedics.   She will likely need SNF placement as she lives alone in an apartment complex.   Patient does not want any IV pain medications.   Ordered Tylenol  PRN, Oxy IR PRN. Out patient follow up with orthopedics.    Chronic normocytic anemia Hemoglobin 9.2 (baseline 11-12).  Patient has a large hematoma in her left hip/lateral/posterior thigh region.   She is not endorsing any symptoms of GI bleed or any other bleeding.     Paroxysmal A-fib/flutter: Rate controlled. Not on anticoagulation.   Asthma: Stable, no signs of acute exacerbation.  Albuterol  PRN.   Hypertension: Continue meds and check BP daily.   Hyperlipidemia: Not on any medications   GERD: No acute issues   Physical deconditioning: Leading to multiple falls.  PT/OT evaluation and skilled nursing facility placement on discharge.   Disposition: [facility] skilled [facility] facility     Consultants: None Procedures performed: None  Disposition: Skilled nursing facility Diet recommendation:  Cardiac diet DISCHARGE MEDICATION: Allergies as of  11/24/2023       Reactions   Allegra [fexofenadine] Other (See Comments)   Syncope   Estrace [estradiol] Other (See Comments)   Headache   Miacalcin [calcitonin] Palpitations   Left bundle branch block   Nsaids Other (See Comments)   Stomach ulcer after taking for 3 days in 2009. Negative H. Pylori test.   Benadryl [diphenhydramine] Other (See Comments)   Syncope   Atrovent Hfa [ipratropium Bromide Hfa] Other (See Comments)   Syncope    Paraphenylenediamine Other (See Comments)   Unknown reaction   Augmentin [amoxicillin -pot Clavulanate] Rash   OK to take plain amoxicillin    Biaxin [clarithromycin] Rash   OK to take azithromycin   Cipro [ciprofloxacin Hcl] Rash   Codeine Other (See Comments)   Unknown reaction   Epipen [epinephrine] Other (See Comments)   Unknown reaction   Erythromycin Other (See Comments)   Stomach irritation   Flonase [fluticasone] Other (See Comments)   Unknown reaction Similar reactions to corticoid steroids   Latex Rash   Macrobid [nitrofurantoin] Other (See Comments)   Multiple mild symptoms - would prefer not to take again   Other Other (See Comments)   Propanol amine Anti-cholinergic compounds   Silenor  [doxepin  Hcl] Other (See Comments)   Heart arrhythmia   Sulfa Antibiotics Other (See Comments)   Unknown reaction   Sumycin [tetracycline] Other (See Comments)   Mouth sores OK to take doxycycline   Zestril [lisinopril] Other (See Comments)   Bruising Cankers Arthralgias        Medication List     STOP taking these medications    sodium chloride  1 g tablet       TAKE these  medications    acetaminophen  325 MG tablet Commonly known as: TYLENOL  Take 650 mg by mouth every 8 (eight) hours as needed for moderate pain (pain score 4-6).   amiodarone  100 MG tablet Commonly known as: PACERONE  TAKE ONE TABLET DAILY   CENTRUM ADULT PO Take 1 tablet by mouth daily.   losartan  50 MG tablet Commonly known as: COZAAR  TAKE ONE TABLET  DAILY   oxyCODONE  5 MG immediate release tablet Commonly known as: Oxy IR/ROXICODONE  Take 1 tablet (5 mg total) by mouth every 6 (six) hours as needed for severe pain (pain score 7-10).        Follow-up Information     Rakes, Rock HERO, FNP. Schedule an appointment as soon as possible for a visit in 1 week(s).   Specialty: Family Medicine Contact information: [address] [place] [address] [phone]         [facility]. Schedule an appointment as soon as possible for a visit in 2 week(s).   Contact informationBETHA Ruthellen Linger  [address] [phone]               Discharge Exam: Fredricka Weights   11/22/23 2146  Weight: 53.3 kg   General exam: Appears calm and comfortable  Respiratory system: Clear to auscultation. Respiratory effort normal. Cardiovascular system: S1 & S2 heard, RRR. No JVD, murmurs, rubs, gallops or clicks. No pedal edema. Gastrointestinal system: Abdomen is nondistended, soft and nontender. No organomegaly or masses felt. Normal bowel sounds heard. Central nervous system: Alert and oriented. No focal neurological deficits. Extremities: Minimal movement of the lower extremity secondary to pubic rami fractures, evidence of hematoma in the left lateral posterior thigh region Skin: No rashes, lesions or ulcers Psychiatry: Judgement and insight appear normal. Mood & affect appropriate.  Condition at discharge: good  The results of significant diagnostics from this hospitalization (including imaging, microbiology, ancillary and laboratory) are listed below for reference.   Imaging Studies: DG Femur Portable Min 2 Views Left Result Date: 11/22/2023 CLINICAL DATA:  Pubic rami fractures. EXAM: LEFT FEMUR PORTABLE 2 VIEWS COMPARISON:  Right hip x-ray 11/22/2023. FINDINGS: There is no evidence of fracture or other focal bone lesions. Peripheral vascular calcifications are present. Soft tissues are otherwise within normal limits. There  are mild degenerative changes of the medial compartment of the knee. IMPRESSION: 1. No acute fracture or dislocation. 2. Peripheral vascular disease. Electronically Signed   By: Greig Pique M.D.   On: 11/22/2023 17:29   DG HIP UNILAT W OR W/O PELVIS 2-3 VIEWS LEFT Result Date: 11/22/2023 CLINICAL DATA:  Left hip pain after fall. EXAM: DG HIP (WITH OR WITHOUT PELVIS) 2-3V*L* COMPARISON:  October 07, 2023. FINDINGS: Moderately displaced fractures are seen involving the medial portions of the bilateral inferior and superior pubic rami. No fracture or dislocation is seen involving the proximal femurs. Mild degenerative change of left hip is noted. IMPRESSION: Moderately displaced bilateral inferior and superior pubic rami fractures. 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 Electronically Signed   By: Lynwood Landy Raddle M.D.   On: 11/22/2023 15:40    Microbiology: Results for orders placed or performed during the hospital encounter of 06/19/23  Resp panel by RT-PCR (RSV, Flu A&B, Covid) Anterior Nasal Swab     Status: Abnormal   Collection Time: 06/19/23  6:31 PM   Specimen: Anterior Nasal Swab  Result Value Ref Range Status   SARS Coronavirus 2 by RT PCR NEGATIVE NEGATIVE Final    Comment: (NOTE) SARS-CoV-2 target nucleic  acids are NOT DETECTED.  The SARS-CoV-2 RNA is generally detectable in upper respiratory specimens during the acute phase of infection. The lowest concentration of SARS-CoV-2 viral copies this assay can detect is 138 copies/mL. A negative result does not preclude SARS-Cov-2 infection and should not be used as the sole basis for treatment or other patient management decisions. A negative result may occur with  improper specimen collection/handling, submission of specimen other than nasopharyngeal swab, presence of viral mutation(s) within the areas targeted by this assay, and inadequate number of viral copies(<138 copies/mL). A negative result must be combined with clinical  observations, patient history, and epidemiological information. The expected result is Negative.  Fact Sheet for Patients:  BloggerCourse.com  Fact Sheet for Healthcare Providers:  SeriousBroker.it  This test is no t yet approved or cleared by the [place]  FDA and  has been authorized for detection and/or diagnosis of SARS-CoV-2 by FDA under an Emergency Use Authorization (EUA). This EUA will remain  in effect (meaning this test can be used) for the duration of the COVID-19 declaration under Section 564(b)(1) of the Act, 21 U.S.C.section 360bbb-3(b)(1), unless the authorization is terminated  or revoked sooner.       Influenza A by PCR POSITIVE (A) NEGATIVE Final   Influenza B by PCR NEGATIVE NEGATIVE Final    Comment: (NOTE) The Xpert Xpress SARS-CoV-2/FLU/RSV plus assay is intended as an aid in the diagnosis of influenza from Nasopharyngeal swab specimens and should not be used as a sole basis for treatment. Nasal washings and aspirates are unacceptable for Xpert Xpress SARS-CoV-2/FLU/RSV testing.  Fact Sheet for Patients: BloggerCourse.com  Fact Sheet for Healthcare Providers: SeriousBroker.it  This test is not yet approved or cleared by the [place]  FDA and has been authorized for detection and/or diagnosis of SARS-CoV-2 by FDA under an Emergency Use Authorization (EUA). This EUA will remain in effect (meaning this test can be used) for the duration of the COVID-19 declaration under Section 564(b)(1) of the Act, 21 U.S.C. section 360bbb-3(b)(1), unless the authorization is terminated or revoked.     Resp Syncytial Virus by PCR NEGATIVE NEGATIVE Final    Comment: (NOTE) Fact Sheet for Patients: BloggerCourse.com  Fact Sheet for Healthcare Providers: SeriousBroker.it  This test is not yet approved or cleared  by the [place]  FDA and has been authorized for detection and/or diagnosis of SARS-CoV-2 by FDA under an Emergency Use Authorization (EUA). This EUA will remain in effect (meaning this test can be used) for the duration of the COVID-19 declaration under Section 564(b)(1) of the Act, 21 U.S.C. section 360bbb-3(b)(1), unless the authorization is terminated or revoked.  Performed at [facility], [address], [place], [place] [address]     Labs: CBC: Recent Labs  Lab 11/22/23 1912 11/23/23 0328  WBC 6.0  --   NEUTROABS 4.1  --   HGB 9.2* 9.2*  HCT 28.7* 30.2*  MCV 92.0  --   PLT 192  --    Basic Metabolic Panel: Recent Labs  Lab 11/22/23 1912  NA 139  K 4.1  CL 104  CO2 25  GLUCOSE 98  BUN 22  CREATININE 1.00  CALCIUM 9.3   Liver Function Tests: Recent Labs  Lab 11/22/23 1912  AST 21  ALT 15  ALKPHOS 154*  BILITOT 0.6  PROT 5.6*  ALBUMIN 3.5   CBG: No results for input(s): GLUCAP in the last 168 hours.  Discharge time spent: 43 minutes.  Signed: Deliliah Room, MD [facility] 11/24/2023

## 2023-11-25 ENCOUNTER — Inpatient Hospital Stay: Admitting: Family Medicine

## 2023-11-26 ENCOUNTER — Other Ambulatory Visit: Payer: Self-pay | Admitting: Family Medicine

## 2023-12-02 ENCOUNTER — Other Ambulatory Visit: Payer: Self-pay | Admitting: *Deleted

## 2023-12-02 NOTE — Patient Outreach (Signed)
 Heidi Dorsey resides in [facility]. [facility] SNF waiver utilized for admission to [facility].  Secure communication sent to [facility] SNF social worker, Deseree, to inquire about transition plans and barriers.  Will continue to follow.  Pablo Hurst, MSN, RN, BSN [place]  [facility] RN Post- Acute Care Manager Direct Dial: [phone]

## 2023-12-14 ENCOUNTER — Other Ambulatory Visit: Payer: Self-pay | Admitting: Family Medicine

## 2023-12-14 ENCOUNTER — Other Ambulatory Visit: Payer: Self-pay | Admitting: *Deleted

## 2023-12-14 NOTE — Patient Outreach (Signed)
 Heidi Dorsey resides in [facility]. [facility] SNF waiver utilized for SNF admission.   Update previously received from Deseree, [facility], SNF social worker indicating Heidi Dorsey will return to [facility] upon SNF discharge.  Wll continue to follow.  Pablo Hurst, MSN, RN, BSN [place]  [facility], [facility] RN Case Manager for Aging [place] Direct Dial: [phone]

## 2023-12-23 ENCOUNTER — Ambulatory Visit: Admitting: Family Medicine

## 2023-12-26 ENCOUNTER — Encounter: Payer: Self-pay | Admitting: Family Medicine

## 2023-12-28 ENCOUNTER — Other Ambulatory Visit: Payer: Self-pay | Admitting: *Deleted

## 2023-12-28 NOTE — Patient Outreach (Signed)
 Per [facility], Ms. Sozio discharged from [facility] on 12/23/23. [facility] SNF waiver previously utilized for admission to [facility].  SNF social worker previously indicated Ms Cadogan will return to [facility]   Pablo Hurst, MSN, RN, BSN [place]  [facility], [facility] RN Post- Acute Care Manager Direct Dial: [phone]

## 2024-03-17 ENCOUNTER — Emergency Department
Admission: EM | Admit: 2024-03-17 | Discharge: 2024-03-17 | Disposition: A | Discharge status: Home / Self Care | Attending: Emergency Medicine | Admitting: Emergency Medicine

## 2024-03-17 ENCOUNTER — Other Ambulatory Visit: Payer: Self-pay

## 2024-03-17 ENCOUNTER — Emergency Department

## 2024-03-17 DIAGNOSIS — M79605 Pain in left leg: Secondary | ICD-10-CM | POA: Insufficient documentation

## 2024-03-17 DIAGNOSIS — N189 Chronic kidney disease, unspecified: Secondary | ICD-10-CM | POA: Insufficient documentation

## 2024-03-17 DIAGNOSIS — R600 Localized edema: Secondary | ICD-10-CM | POA: Insufficient documentation

## 2024-03-17 DIAGNOSIS — M79652 Pain in left thigh: Secondary | ICD-10-CM | POA: Insufficient documentation

## 2024-03-17 DIAGNOSIS — M25552 Pain in left hip: Secondary | ICD-10-CM | POA: Insufficient documentation

## 2024-03-17 DIAGNOSIS — I129 Hypertensive chronic kidney disease with stage 1 through stage 4 chronic kidney disease, or unspecified chronic kidney disease: Secondary | ICD-10-CM | POA: Insufficient documentation

## 2024-03-17 DIAGNOSIS — F0390 Unspecified dementia without behavioral disturbance: Secondary | ICD-10-CM | POA: Insufficient documentation

## 2024-03-17 LAB — BASIC METABOLIC PANEL WITH GFR
Anion gap: 10 (ref 5–15)
BUN: 22 mg/dL (ref 8–23)
CO2: 25 mmol/L (ref 22–32)
Calcium: 8.8 mg/dL — ABNORMAL LOW (ref 8.9–10.3)
Chloride: 98 mmol/L (ref 98–111)
Creatinine, Ser: 0.81 mg/dL (ref 0.44–1.00)
GFR, Estimated: >60 mL/min (ref >60)
Glucose, Bld: 107 mg/dL — ABNORMAL HIGH (ref 70–99)
Potassium: 4.6 mmol/L (ref 3.5–5.1)
Sodium: 133 mmol/L — ABNORMAL LOW (ref 135–145)

## 2024-03-17 LAB — CBC WITH DIFFERENTIAL/PLATELET
Abs Immature Granulocytes: 0.04 K/uL (ref 0.00–0.07)
Basophils Absolute: 0 K/uL (ref 0.0–0.1)
Basophils Relative: 1 %
Eosinophils Absolute: 0.1 K/uL (ref 0.0–0.5)
Eosinophils Relative: 1 %
HCT: 33.3 % — ABNORMAL LOW (ref 36.0–46.0)
Hemoglobin: 10.7 g/dL — ABNORMAL LOW (ref 12.0–15.0)
Immature Granulocytes: 1 %
Lymphocytes Relative: 11 %
Lymphs Abs: 0.8 K/uL (ref 0.7–4.0)
MCH: 28.9 pg (ref 26.0–34.0)
MCHC: 32.1 g/dL (ref 30.0–36.0)
MCV: 90 fL (ref 80.0–100.0)
Monocytes Absolute: 0.7 K/uL (ref 0.1–1.0)
Monocytes Relative: 10 %
Neutro Abs: 5.1 K/uL (ref 1.7–7.7)
Neutrophils Relative %: 76 %
Platelets: 161 K/uL (ref 150–400)
RBC: 3.7 MIL/uL — ABNORMAL LOW (ref 3.87–5.11)
RDW: 15.3 % (ref 11.5–15.5)
WBC: 6.7 K/uL (ref 4.0–10.5)
nRBC: 0 % (ref 0.0–0.2)

## 2024-03-17 LAB — PRO BRAIN NATRIURETIC PEPTIDE: Pro Brain Natriuretic Peptide: 2445 pg/mL — ABNORMAL HIGH (ref <300.0)

## 2024-03-17 MED ORDER — FUROSEMIDE 40 MG PO TABS
20.0000 mg | ORAL_TABLET | Freq: Once | ORAL | Status: AC
  Administered 2024-03-17: 20 mg via ORAL
  Filled 2024-03-17: qty 1

## 2024-03-17 MED ORDER — FUROSEMIDE 20 MG PO TABS: 20.0000 mg | ORAL_TABLET | Freq: Every day | ORAL | 0 refills | Status: AC

## 2024-03-17 MED ORDER — ACETAMINOPHEN 500 MG PO TABS
1000.0000 mg | ORAL_TABLET | Freq: Once | ORAL | Status: AC
  Administered 2024-03-17: 1000 mg via ORAL
  Filled 2024-03-17: qty 2

## 2024-03-17 NOTE — ED Triage Notes (Signed)
 Pt to ED from [facility] for insect bite to L thigh, has gotten worse over last couple weeks. Pain to same leg. Mental status at baseline. POA aware pt coming here. POA was the one who called EMS. POA told them maybe pt had a fall b/c is having the leg pain. Both lower legs are swollen and pt has compression hose on.   EMS VS: 208/83, 95% RA, HR 63, HR 98.9, has 20g

## 2024-03-17 NOTE — Discharge Instructions (Addendum)
 You can follow-up with Dr. Ashley from podiatry, I put in number below for you to call.  I have also put in a referral for you for the heart failure clinic, you should get a call from them in 1 to 2 days to schedule an appointment.  For your lower extremity edema, I have started you on Lasix  20 mg daily.  Please make sure to follow-up with your primary care doctor this week if you are not able to see cardiology this week to get repeat labs done to make sure that your electrolytes are okay while on the Lasix .

## 2024-03-17 NOTE — ED Provider Notes (Signed)
 Heidi Dorsey Provider Note    Event Date/Time   First MD Initiated Contact with Patient 03/17/24 1212     (approximate)   History   Leg Pain   HPI  Heidi Dorsey is a 89 y.o. female with history of dementia, GERD, hypertension, hyperlipidemia, CKD, presenting with left thigh aching.  She denies any falls or trauma.  States that she thinks that she might have a spider bite to her left calf several weeks ago, has a knot there, denies any redness or purulent drainage.  No focal weakness or numbness.  Denies chest pain or shortness of breath, no coughing or urinary symptoms, no nausea vomiting diarrhea.  Independent history from EMS, she was brought in because POA called concerned that she might have had a fall.  Was noted to be hypertensive, satting 95% on room air, both legs are swollen with compressive stockings.  Facility had told them that she has not had any recent falls.  Did call POA, he states that she was complaining about new left leg pain, is unsure if she fell and so wanted her to be evaluated.  Is also asking for a referral for podiatry.  On independent chart review, she was admitted in August for bilateral inferior and superior pubic rami fractures secondary to mechanical fall several days ago, nonop management, weightbearing as tolerated.  Has history of A-fib a flutter not on anticoagulation.  She was discharged to SNF.  Does not history of heart failure, grade 1 diastolic function on echo done in 2024, is not on any Lasix .      Physical Exam   Triage Vital Signs: ED Triage Vitals [03/17/24 1213]  Encounter Vitals Group     BP      Girls Systolic BP Percentile      Girls Diastolic BP Percentile      Boys Systolic BP Percentile      Boys Diastolic BP Percentile      Pulse      Resp      Temp      Temp src      SpO2      Weight 172 lb 14.4 oz (78.4 kg)     Height 5' 6 (1.676 m)     Head Circumference      Peak Flow      Pain  Score      Pain Loc      Pain Education      Exclude from Growth Chart     Most recent vital signs: Vitals:   03/17/24 1213  BP: (!) 199/72  Pulse: 78  Resp: 20  Temp: 98 F (36.7 C)  SpO2: 100%     General: Awake, no distress.  CV:  Good peripheral perfusion.  Resp:  Normal effort.  No thoracic cage tenderness, no tachypnea or respiratory distress Abd:  No distention.  Soft nontender Other:  Able to fully range bilateral lower extremities, she has bilateral lower extremity edema but they appear symmetrical, no erythema purulent drainage, no open wounds.  Palpable DP pulses bilaterally.  She has a nodule to her left lateral leg distally, there is no tenderness or purulent drainage, no induration of fluctuance, appears chronic, she is tender to her left hip and anterior thigh on palpation without erythema, no palpable deformities.  No focal weakness or numbness.   ED Results / Procedures / Treatments   Labs (all labs ordered are listed, but only abnormal results are displayed) Labs  Reviewed  BASIC METABOLIC PANEL WITH GFR - Abnormal; Notable for the following components:      Result Value   Sodium 133 (*)    Glucose, Bld 107 (*)    Calcium 8.8 (*)    All other components within normal limits  CBC WITH DIFFERENTIAL/PLATELET - Abnormal; Notable for the following components:   RBC 3.70 (*)    Hemoglobin 10.7 (*)    HCT 33.3 (*)    All other components within normal limits  PRO BRAIN NATRIURETIC PEPTIDE - Abnormal; Notable for the following components:   Pro Brain Natriuretic Peptide 2,445.0 (*)    All other components within normal limits     RADIOLOGY On my independent interpretation, ultrasound without obvious DVT   PROCEDURES:  Critical Care performed: No  Procedures   MEDICATIONS ORDERED IN ED: Medications  furosemide  (LASIX ) tablet 20 mg (has no administration in time range)  acetaminophen  (TYLENOL ) tablet 1,000 mg (1,000 mg Oral Given 03/17/24 1345)      IMPRESSION / MDM / ASSESSMENT AND PLAN / ED COURSE  I reviewed the triage vital signs and the nursing notes.                              Differential diagnosis includes, but is not limited to, contusion, strain, sprain, DVT, did consider fracture but patient has no reported recent falls.  Or leg swelling, did also consider lymphedema, CHF, she is not shortness of breath or chest pain at this time.  Will get x-rays of the hip and femur, DVT ultrasound.  Labs, BNP.  A workup is negative for acute trauma or DVT, will likely start her on a low-dose Lasix  and refer her to CHF clinic.  Patient's presentation is most consistent with acute presentation with potential threat to life or bodily function.  Independent interpretation of labs and imaging below.  On reassessment patient is asymptomatic, discussed with daughter and patient about imaging and lab results including incidental findings.  X-ray without acute fractures, DVT without acute DVT.  Will start her on Lasix  here, first dose given in the emergency department.  Put in a referral for heart failure clinic for her to follow-up.  She has family also wanted a podiatrist number that they can call for issues for her.  I will put in the number for Dr. Ashley who is on-call today.  Otherwise considered but no indication for inpatient admission at this time, she safe for outpatient management.  Will discharge with strict return precautions.    Clinical Course as of 03/17/24 1417  Sat Mar 17, 2024  1259 DG FEMUR MIN 2 VIEWS LEFT [TT]  1259 DG FEMUR MIN 2 VIEWS LEFT IMPRESSION: 1. No acute fracture or dislocation. 2. Stable chronic left superior and inferior pubic rami fractures   [TT]  1259 DG Hip Unilat With Pelvis 2-3 Views Left IMPRESSION: 1. Acute mildly displaced fractures of the left superior and inferior pubic rami.   [TT]  1303 Reached out to [facility] radiology to verify if radiologist can look at the images again and clarify  if there are acute versus chronic fractures.  They will reach out to radiology and call back. [TT]  1314 Radiology was able to review the images, states that the fractures seen are chronic, no acute fractures. [TT]  1333 US  Venous Img Lower  Left (DVT Study) IMPRESSION: 1. No evidence of deep venous thrombosis in the left lower extremity. 2. Left  peroneal vein evaluation limited due to positioning, with nonvisualization of this segment.   [TT]  1407 Independent review of labs, electrolyte severely deranged, no leukocytosis, BNP is elevated, she does have history of diastolic heart failure, not water pill, will start her on Lasix  here and have her follow-up with CHF clinic. [TT]  1409 Patient has been able to weight-bear and ambulate here using a walker. [TT]    Clinical Course User Index [TT] Waymond Lorelle Cummins, MD     FINAL CLINICAL IMPRESSION(S) / ED DIAGNOSES   Final diagnoses:  Lower extremity edema  Left hip pain  Left thigh pain     Rx / DC Orders   ED Discharge Orders          Ordered    furosemide  (LASIX ) 20 MG tablet  Daily        03/17/24 1408    Ambulatory referral to Cardiology       Comments: If you have not heard from the Cardiology office within the next 72 hours please call [phone].   03/17/24 1408             Note:  This document was prepared using Dragon voice recognition software and may include unintentional dictation errors.    Waymond Lorelle Cummins, MD 03/17/24 [phone]

## 2024-03-19 ENCOUNTER — Other Ambulatory Visit: Payer: Self-pay

## 2024-03-19 ENCOUNTER — Emergency Department

## 2024-03-19 ENCOUNTER — Emergency Department
Admission: EM | Admit: 2024-03-19 | Discharge: 2024-03-20 | Disposition: A | Discharge status: Skilled Nursing Facility | Attending: Emergency Medicine | Admitting: Emergency Medicine

## 2024-03-19 DIAGNOSIS — M25551 Pain in right hip: Secondary | ICD-10-CM

## 2024-03-19 DIAGNOSIS — W19XXXA Unspecified fall, initial encounter: Secondary | ICD-10-CM

## 2024-03-19 NOTE — ED Triage Notes (Signed)
 Pt BIB [facility] from [facility] for slipping out of bed, c/o rt hip pain and l knee pain. Hx dementia

## 2024-03-19 NOTE — ED Provider Notes (Signed)
 [facility] Provider Note    Event Date/Time   First MD Initiated Contact with Patient 03/19/24 2300     (approximate)   History   Fall  Level 5 caveat:  history/ROS limited by chronic dementia  HPI Heidi Dorsey is a 89 y.o. female who presents from [place] house after a fall.  She reports that she was getting into bed and the bed was moving away from her so she sat down hard on the floor.  She has acute pain in her right hip.  She said that she also has some pain in her left knee that has been present for a couple of days (the chronicity of it is likely to be more chronic but she cannot remember for sure).  She said as long as she is lying still she does not have pain but when she moves at all she has pain in the upper femur.  No numbness or tingling.  No chest pain or shortness of breath.  She did not hit her head and has no headache nor neck pain.     Physical Exam   Triage Vital Signs: ED Triage Vitals  Encounter Vitals Group     BP 03/19/24 2311 (!) 133/93     Girls Systolic BP Percentile --      Girls Diastolic BP Percentile --      Boys Systolic BP Percentile --      Boys Diastolic BP Percentile --      Pulse Rate 03/19/24 2311 63     Resp 03/19/24 2311 18     Temp 03/19/24 2311 97.9 F (36.6 C)     Temp Source 03/19/24 2311 Oral     SpO2 03/19/24 2311 100 %     Weight 03/19/24 2312 65.2 kg (143 lb 11.8 oz)     Height 03/19/24 2312 1.524 m (5')     Head Circumference --      Peak Flow --      Pain Score 03/19/24 2312 5     Pain Loc --      Pain Education --      Exclude from Growth Chart --     Most recent vital signs: Vitals:   03/19/24 2330 03/20/24 0100  BP: (!) 167/60 (!) 173/52  Pulse: 65 (!) 59  Resp:    Temp:    SpO2: 100% 100%    General: Awake and alert, answering questions and acting appropriate but confabulates answers to which she does not know the answer. CV:  Good peripheral perfusion.  Regular rate and  rhythm. Resp:  Normal effort. Speaking easily and comfortably, no accessory muscle usage nor intercostal retractions.   Abd:  No distention.  No tenderness to palpation of the abdomen. Other:  Pain with palpation of the right upper femur and right hip with no palpable instability of her pelvis.  The pain is reproducible with passive range of motion of the right leg.  She is able to flex and extend her left knee without any obvious difficulty but she said that it hurts when I press on it.  There are no visible or palpable deformities in either extremity.   ED Results / Procedures / Treatments   Labs (all labs ordered are listed, but only abnormal results are displayed) Labs Reviewed - No data to display    RADIOLOGY See ED course for details   PROCEDURES:  Critical Care performed: No  Procedures    IMPRESSION / MDM /  ASSESSMENT AND PLAN / ED COURSE  I reviewed the triage vital signs and the nursing notes.                              Differential diagnosis includes, but is not limited to, contusion, musculoskeletal strain, pelvic fracture, femur fracture, knee contusion versus fracture versus dislocation.  Patient's presentation is most consistent with acute presentation with potential threat to life or bodily function.  Labs/studies ordered: Pelvis/right hip x-rays, left knee x-rays  Interventions/Medications given:  Medications - No data to display  (Note:  hospital course my include additional interventions and/or labs/studies not listed above.)   No indication for additional workup after mechanical fall but will evaluate with imaging and reassess.  Patient is comfortable with the current plan.     Clinical Course as of 03/20/24 0129  Tue Mar 20, 2024  0036 DG Knee Complete 4 Views Left I independently viewed and interpreted the patient's knee x-rays and there is no sign of acute injury such as a fracture or dislocation.  I also read the radiologist's report,  which confirmed no acute findings. [CF]  0038 DG HIP UNILAT WITH PELVIS 2-3 VIEWS RIGHT I independently viewed and interpreted the patient's hip and pelvis x-rays and I see no evidence of fracture or dislocation.  The radiologist commented that the exam is somewhat limited based on overlapping soft tissues but there is no evidence of fracture or dislocation. [CF]  0039 The patient's medical screening exam is reassuring with no indication of an emergent medical condition requiring hospitalization or additional evaluation at this point.  The patient is safe and appropriate for discharge and outpatient follow up. [CF]    Clinical Course User Index [CF] Gordan Huxley, MD     FINAL CLINICAL IMPRESSION(S) / ED DIAGNOSES   Final diagnoses:  Fall, initial encounter  Right hip pain     Rx / DC Orders   ED Discharge Orders     None        Note:  This document was prepared using Dragon voice recognition software and may include unintentional dictation errors.   Gordan Huxley, MD 03/20/24 [phone]

## 2024-03-20 NOTE — ED Notes (Signed)
 Received no answer again when attempting to call pt's legal guardian

## 2024-03-20 NOTE — Discharge Instructions (Signed)

## 2024-03-20 NOTE — ED Notes (Signed)
 Left detailed voicemail for Tonie Gravely, pt's legal guardian, at number listed in this pt's chart with my [employer] phone number to return my call.

## 2024-04-19 ENCOUNTER — Telehealth: Payer: Self-pay | Admitting: Family

## 2024-04-19 NOTE — Telephone Encounter (Signed)
 Called to confirm/remind patient of their appointment at the [facility] on 04/20/24.   Appointment:   [x] Confirmed  [] Left mess   [] No answer/No voice mail  [] VM Full/unable to leave message  [] Phone not in service  Patient reminded to bring all medications and/or complete list.  Confirmed patient has transportation. Gave directions, instructed to utilize valet parking.

## 2024-04-19 NOTE — Progress Notes (Signed)
 "  [facility] Note   Referring Physician: ED visit 03/17/24 PCP: Severa Rock HERO, FNP Cardiologist: Lynwood Schilling, MD   Chief Complaint: shortness of breath   HPI:  Heidi Dorsey is a 89 y/o female with a history of HFpEF, dementia, GERD, hypertension, hyperlipidemia, CKD, afib/ flutter (not on NOAC due to falls), anemia   Echo 10/18/22: EF 60-65%, mild LVH, G1DD, normal RV, normal PA pressure, trivial MR, mild AR.   Was in the ED 03/17/24 with aching in her left thigh. Denies falls or trauma. POA was concerned she may have had a fall. Xrays without fracture. Doppler negative for DVT. Low dose lasix  started and referred to HF clinic.   Was in the ED 03/19/24 with a fall getting into bed. Fell hard on the floor and has acute pain in right hip. Hip xray negative for a fracture.   She presents today, with group home caregiver, for her initial HF visit with a chief complaint of minimal shortness of breath. Has associated dry cough, hoarseness, intermittent dizziness, balance issues, pedal edema and spot on outside of left lower leg. Denies chest pain or difficulty eating. Skin is very dry on bilateral lower legs.   Review of Systems: [y] = yes, [ ]  = no   General: Weight gain [ ] ; Weight loss [ ] ; Anorexia [ ] ; Fatigue [ ] ; Fever [ ] ; Chills [ ] ; Weakness [ ]   Cardiac: Chest pain/pressure [ ] ; Resting SOB [ ] ; Exertional SOB davis.dad ]; Orthopnea [ ] ; Pedal Edema davis.dad ]; Palpitations [ ] ; Syncope [ ] ; Presyncope [ ] ; Paroxysmal nocturnal dyspnea[ ]   Pulmonary: Cough davis.dad ]; Wheezing[ ] ; Hemoptysis[ ] ; Sputum [ ] ; Snoring [ ]   GI: Vomiting[ ] ; Dysphagia[ ] ; Melena[ ] ; Hematochezia [ ] ; Heartburn[ ] ; Abdominal pain [ ] ; Constipation [ ] ; Diarrhea [ ] ; BRBPR [ ]   GU: Hematuria[ ] ; Dysuria [ ] ; Nocturia[ ]   Vascular: Pain in legs with walking [ ] ; Pain in feet with lying flat [ ] ; Non-healing sores [ ] ; Stroke [ ] ; TIA [ ] ; Slurred speech [ ] ;  Neuro: Dizziness[y ]; Vertigo[ ] ; Seizures[ ] ;  Paresthesias[ ] ;Blurred vision [ ] ; Diplopia [ ] ; Vision changes [ ]   Ortho/Skin: Arthritis [ ] ; Joint pain [ ] ; Muscle pain [ ] ; Joint swelling [ ] ; Back Pain [ ] ; Rash [ ]   Psych: Depression[ ] ; Anxiety[ ]   Heme: Bleeding problems [ ] ; Clotting disorders [ ] ; Anemia [ y]  Endocrine: Diabetes [ ] ; Thyroid  dysfunction[ ]    Past Medical History:  Diagnosis Date   Asthma    BPPV (benign paroxysmal positional vertigo)    Claustrophobia    Diverticulosis    GERD (gastroesophageal reflux disease)    H/O degenerative disc disease    Hiatal hernia    Hyperlipidemia    Hypertension    Kidney stones    Migraines    Osteoarthritis    Caused feet deformity   Osteopenia    Prediabetes    Preglaucoma    Raynauds syndrome    Rectocele    Rosacea    Sciatica    Sleep apnea    Spinal stenosis    Vertebrobasilar artery syndrome     Current Outpatient Medications  Medication Sig Dispense Refill   acetaminophen  (TYLENOL ) 325 MG tablet Take 650 mg by mouth every 8 (eight) hours as needed for moderate pain (pain score 4-6).     amiodarone  (PACERONE ) 100 MG tablet TAKE ONE TABLET DAILY 30 tablet  0   furosemide  (LASIX ) 20 MG tablet Take 1 tablet (20 mg total) by mouth daily. 30 tablet 0   losartan  (COZAAR ) 50 MG tablet TAKE ONE TABLET DAILY 30 tablet 0   Multiple Vitamins-Minerals (CENTRUM ADULT PO) Take 1 tablet by mouth daily.     oxyCODONE  (OXY IR/ROXICODONE ) 5 MG immediate release tablet Take 1 tablet (5 mg total) by mouth every 6 (six) hours as needed for severe pain (pain score 7-10). 20 tablet 0   No current facility-administered medications for this visit.    Allergies[1]    Social History   Socioeconomic History   Marital status: Single    Spouse name: Not on file   Number of children: 2   Years of education: 12   Highest education level: 12th grade  Occupational History   Occupation: Retired    Comment: Scientist, Product/process Development  Tobacco Use   Smoking status: Never     Passive exposure: Never   Smokeless tobacco: Never   Tobacco comments:    Verified by Grandson/Healthcare Power of Attorney, Tonie Gravely.  Vaping Use   Vaping status: Never Used  Substance and Sexual Activity   Alcohol use: Yes    Alcohol/week: 2.0 standard drinks of alcohol    Types: 2 Glasses of wine per week    Comment: per week   Drug use: Never   Sexual activity: Not Currently    Partners: Male    Birth control/protection: Surgical  Other Topics Concern   Not on file  Social History Narrative   Right Handed   Lives in an apartment complex and her apartment is on ground level.    Drinks [employer] Coffee and Tea   Originally from [place], then lived in [place] most of her life - moved here 09/2019 - feels out of place, having trouble finding others with similar interests   She is agnostic - not religious (although raised Catholic) - strong scientific beliefs    Social Drivers of Health   Tobacco Use: Low Risk (03/17/2024)   Patient History    Smoking Tobacco Use: Never    Smokeless Tobacco Use: Never    Passive Exposure: Never  Financial Resource Strain: Low Risk (12/19/2022)   Overall Financial Resource Strain (CARDIA)    Difficulty of Paying Living Expenses: Not hard at all  Food Insecurity: No Food Insecurity (11/22/2023)   Epic    Worried About Programme Researcher, Broadcasting/film/video in the Last Year: Never true    Ran Out of Food in the Last Year: Never true  Transportation Needs: No Transportation Needs (11/22/2023)   Epic    Lack of Transportation (Medical): No    Lack of Transportation (Non-Medical): No  Physical Activity: Inactive (12/19/2022)   Exercise Vital Sign    Days of Exercise per Week: 0 days    Minutes of Exercise per Session: 0 min  Stress: No Stress Concern Present (12/19/2022)   [employer] of Occupational Health - Occupational Stress Questionnaire    Feeling of Stress : Not at all  Social Connections: Socially Isolated (11/22/2023)   Social Connection and  Isolation Panel    Frequency of Communication with Friends and Family: Once a week    Frequency of Social Gatherings with Friends and Family: Once a week    Attends Religious Services: Never    Database Administrator or Organizations: No    Attends Banker Meetings: Never    Marital Status: Divorced  Catering Manager Violence: Not  At Risk (11/22/2023)   Epic    Fear of Current or Ex-Partner: No    Emotionally Abused: No    Physically Abused: No    Sexually Abused: No  Depression (PHQ2-9): Medium Risk (01/21/2023)   Depression (PHQ2-9)    PHQ-2 Score: 7  Alcohol Screen: Low Risk (12/19/2022)   Alcohol Screen    Last Alcohol Screening Score (AUDIT): 2  Housing: Low Risk (11/22/2023)   Epic    Unable to Pay for Housing in the Last Year: No    Number of Times Moved in the Last Year: 0    Homeless in the Last Year: No  Utilities: Not At Risk (11/22/2023)   Epic    Threatened with loss of utilities: No  Health Literacy: Adequate Health Literacy (12/19/2022)   B1300 Health Literacy    Frequency of need for help with medical instructions: Rarely      Family History  Problem Relation Age of Onset   Osteoporosis Mother    Ovarian cancer Mother    Uterine cancer Mother    Brain cancer Father    Osteoporosis Sister    Bladder Cancer Brother    Rectal cancer Brother    Skin cancer Brother    Diabetes Paternal Grandmother    Breast cancer Neg Hx    Vitals:   04/20/24 1317  BP: (!) 130/42  Pulse: (!) 53  SpO2: 98%  Weight: 120 lb (54.4 kg)   Wt Readings from Last 3 Encounters:  04/20/24 120 lb (54.4 kg)  03/19/24 143 lb 11.8 oz (65.2 kg)  03/17/24 172 lb 14.4 oz (78.4 kg)   Lab Results  Component Value Date   CREATININE 0.81 03/17/2024   CREATININE 1.00 11/22/2023   CREATININE 0.86 10/07/2023    PHYSICAL EXAM: General: Thin appearing elderly female.  Cor: No JVD. Regular rhythm, bradycardic.  Lungs: clear Abdomen: soft, nontender,  nondistended. Extremities: 1+ pitting edema BLE, skin very dry/ flaky. Caregiver says spot on lateral left lower leg is cancer.  Neuro:. Affect pleasantly confused.   ECG: SB with LBBB (personally reviewed)   ASSESSMENT & PLAN:  1: HFpEF- - suspect due to NICM due to HTN / PAF - NYHA class II - euvolemic - order written to be weighed [facility] at [facility] - Echo 10/18/22: EF 60-65%, mild LVH, G1DD, normal RV, normal PA pressure, trivial MR, mild AR.  - continue furosemide  20mg  daily - continue losartan  50mg  daily - risks outweigh potential benefits of SGLT2 / MRA - order written for compression socks to be worn daily with removal at bedtime - proBNP 03/17/24 was 2445.0  2: HTN- - BP 130/42 - BMET 03/17/24 reviewed: sodium 133, potassium 4.6, creatinine 0.81, GFR >60  3: HLD- - LDL 04/21/23 was 100  4: Dementia- - Lives at [facility]  5: PAF- - EKG today: SB, LBBB (personally reviewed) - not on anticoag due to frequent falls - continue amiodarone  100mg  daily - TSH 04/21/23 was 3.8 - should get regular eye exams   Return in 2 months, if patient is stable will not make a follow-up appointment at that time.   I spent 32 minutes reviewing records, interviewing/ examing patient and managing plan/ orders.   Ellouise DELENA Class, FNP 04/19/2024    [1]  Allergies Allergen Reactions   Allegra [Fexofenadine] Other (See Comments)    Syncope   Estrace [Estradiol] Other (See Comments)    Headache   Miacalcin [Calcitonin] Palpitations    Left bundle branch block  Nsaids Other (See Comments)    Stomach ulcer after taking for 3 days in 2009. Negative H. Pylori test.   Benadryl [Diphenhydramine] Other (See Comments)    Syncope   Atrovent Hfa [Ipratropium Bromide Hfa] Other (See Comments)    Syncope    Paraphenylenediamine Other (See Comments)    Unknown reaction   Augmentin [Amoxicillin -Pot Clavulanate] Rash    OK to take plain amoxicillin    Biaxin [Clarithromycin] Rash    OK  to take azithromycin   Cipro [Ciprofloxacin Hcl] Rash   Codeine Other (See Comments)    Unknown reaction   Epipen [Epinephrine] Other (See Comments)    Unknown reaction   Erythromycin Other (See Comments)    Stomach irritation   Flonase [Fluticasone] Other (See Comments)    Unknown reaction Similar reactions to corticoid steroids   Latex Rash   Macrobid [Nitrofurantoin] Other (See Comments)    Multiple mild symptoms - would prefer not to take again   Other Other (See Comments)    Propanol amine Anti-cholinergic compounds   Silenor  [Doxepin  Hcl] Other (See Comments)    Heart arrhythmia   Sulfa Antibiotics Other (See Comments)    Unknown reaction   Sumycin [Tetracycline] Other (See Comments)    Mouth sores OK to take doxycycline   Zestril [Lisinopril] Other (See Comments)    Bruising Cankers Arthralgias   "

## 2024-04-20 ENCOUNTER — Encounter: Payer: Self-pay | Admitting: Family

## 2024-04-20 ENCOUNTER — Ambulatory Visit: Attending: Family | Admitting: Family

## 2024-04-20 VITALS — BP 130/42 | HR 53 | Wt 120.0 lb

## 2024-04-20 DIAGNOSIS — I4892 Unspecified atrial flutter: Secondary | ICD-10-CM | POA: Diagnosis not present

## 2024-04-20 DIAGNOSIS — E785 Hyperlipidemia, unspecified: Secondary | ICD-10-CM | POA: Insufficient documentation

## 2024-04-20 DIAGNOSIS — D649 Anemia, unspecified: Secondary | ICD-10-CM | POA: Insufficient documentation

## 2024-04-20 DIAGNOSIS — N189 Chronic kidney disease, unspecified: Secondary | ICD-10-CM | POA: Insufficient documentation

## 2024-04-20 DIAGNOSIS — R0602 Shortness of breath: Secondary | ICD-10-CM | POA: Diagnosis present

## 2024-04-20 DIAGNOSIS — F0390 Unspecified dementia without behavioral disturbance: Secondary | ICD-10-CM | POA: Insufficient documentation

## 2024-04-20 DIAGNOSIS — K219 Gastro-esophageal reflux disease without esophagitis: Secondary | ICD-10-CM | POA: Diagnosis not present

## 2024-04-20 DIAGNOSIS — I10 Essential (primary) hypertension: Secondary | ICD-10-CM | POA: Diagnosis not present

## 2024-04-20 DIAGNOSIS — I5032 Chronic diastolic (congestive) heart failure: Secondary | ICD-10-CM | POA: Insufficient documentation

## 2024-04-20 DIAGNOSIS — I480 Paroxysmal atrial fibrillation: Secondary | ICD-10-CM | POA: Diagnosis present

## 2024-04-20 DIAGNOSIS — I447 Left bundle-branch block, unspecified: Secondary | ICD-10-CM | POA: Diagnosis not present

## 2024-04-20 DIAGNOSIS — R600 Localized edema: Secondary | ICD-10-CM | POA: Diagnosis present

## 2024-04-20 DIAGNOSIS — I130 Hypertensive heart and chronic kidney disease with heart failure and stage 1 through stage 4 chronic kidney disease, or unspecified chronic kidney disease: Secondary | ICD-10-CM | POA: Diagnosis not present

## 2024-04-20 NOTE — Patient Instructions (Signed)
 Medication Changes:  No medication changes today!   Follow-Up in: Please follow up with the [facility] in 2 months with Ellouise Class, FNP.   Thank you for choosing [facility].    At the [facility], you and your health needs are our priority. We have a designated team specialized in the treatment of Heart Failure. This Care Team includes your primary Heart Failure Specialized Cardiologist (physician), Advanced Practice Providers (APPs- Physician Assistants and Nurse Practitioners), and Pharmacist who all work together to provide you with the care you need, when you need it.   You may see any of the following providers on your designated Care Team at your next follow up:  Dr. Toribio Fuel Dr. Ezra Shuck Dr. Ria Commander Dr. Morene Brownie Ellouise Class, FNP Jaun Bash, RPH-CPP  Please be sure to bring in all your medications bottles to every appointment.   Need to Contact Us :  If you have any questions or concerns before your next appointment please send us  a message through Ernest or call our office at [phone].    TO LEAVE A MESSAGE FOR THE NURSE SELECT OPTION 2, PLEASE LEAVE A MESSAGE INCLUDING: YOUR NAME DATE OF BIRTH CALL BACK NUMBER REASON FOR CALL**this is important as we prioritize the call backs  YOU WILL RECEIVE A CALL BACK THE SAME DAY AS LONG AS YOU CALL BEFORE 4:00 PM

## 2024-04-25 ENCOUNTER — Telehealth: Payer: Self-pay | Admitting: Family

## 2024-06-21 ENCOUNTER — Ambulatory Visit: Admitting: Family
